# Patient Record
Sex: Female | Born: 1965 | Race: Black or African American | Hispanic: Refuse to answer | Marital: Married | Smoking: Never smoker
Health system: Southern US, Community
[De-identification: ages and names within clinical notes are randomized; demographics above are authoritative.]

## PROBLEM LIST (undated history)

## (undated) DIAGNOSIS — K219 Gastro-esophageal reflux disease without esophagitis: Secondary | ICD-10-CM

## (undated) DIAGNOSIS — E119 Type 2 diabetes mellitus without complications: Secondary | ICD-10-CM

## (undated) DIAGNOSIS — D649 Anemia, unspecified: Secondary | ICD-10-CM

## (undated) DIAGNOSIS — F419 Anxiety disorder, unspecified: Secondary | ICD-10-CM

## (undated) DIAGNOSIS — R001 Bradycardia, unspecified: Secondary | ICD-10-CM

## (undated) DIAGNOSIS — J302 Other seasonal allergic rhinitis: Secondary | ICD-10-CM

## (undated) DIAGNOSIS — E785 Hyperlipidemia, unspecified: Secondary | ICD-10-CM

## (undated) DIAGNOSIS — G4733 Obstructive sleep apnea (adult) (pediatric): Secondary | ICD-10-CM

## (undated) DIAGNOSIS — F32A Depression, unspecified: Secondary | ICD-10-CM

## (undated) DIAGNOSIS — Z8632 Personal history of gestational diabetes: Secondary | ICD-10-CM

## (undated) DIAGNOSIS — R51 Headache: Secondary | ICD-10-CM

## (undated) DIAGNOSIS — F329 Major depressive disorder, single episode, unspecified: Secondary | ICD-10-CM

## (undated) DIAGNOSIS — G43909 Migraine, unspecified, not intractable, without status migrainosus: Secondary | ICD-10-CM

## (undated) DIAGNOSIS — E669 Obesity, unspecified: Secondary | ICD-10-CM

## (undated) DIAGNOSIS — I10 Essential (primary) hypertension: Secondary | ICD-10-CM

## (undated) DIAGNOSIS — Z9889 Other specified postprocedural states: Secondary | ICD-10-CM

## (undated) DIAGNOSIS — G4730 Sleep apnea, unspecified: Secondary | ICD-10-CM

## (undated) DIAGNOSIS — M1990 Unspecified osteoarthritis, unspecified site: Secondary | ICD-10-CM

## (undated) DIAGNOSIS — R112 Nausea with vomiting, unspecified: Secondary | ICD-10-CM

## (undated) DIAGNOSIS — F99 Mental disorder, not otherwise specified: Secondary | ICD-10-CM

## (undated) DIAGNOSIS — J45909 Unspecified asthma, uncomplicated: Secondary | ICD-10-CM

## (undated) HISTORY — DX: Depression, unspecified: F32.A

## (undated) HISTORY — DX: Gastro-esophageal reflux disease without esophagitis: K21.9

## (undated) HISTORY — DX: Bradycardia, unspecified: R00.1

## (undated) HISTORY — DX: Personal history of gestational diabetes: Z86.32

## (undated) HISTORY — PX: ABDOMINAL HYSTERECTOMY: SHX81

## (undated) HISTORY — DX: Obstructive sleep apnea (adult) (pediatric): G47.33

## (undated) HISTORY — PX: WISDOM TOOTH EXTRACTION: SHX21

## (undated) HISTORY — DX: Major depressive disorder, single episode, unspecified: F32.9

## (undated) HISTORY — DX: Migraine, unspecified, not intractable, without status migrainosus: G43.909

## (undated) HISTORY — PX: OTHER SURGICAL HISTORY: SHX169

## (undated) HISTORY — DX: Obesity, unspecified: E66.9

---

## 1997-08-24 ENCOUNTER — Encounter: Admission: RE | Admit: 1997-08-24 | Discharge: 1997-08-24 | Payer: Self-pay | Admitting: Family Medicine

## 1997-08-28 ENCOUNTER — Encounter (INDEPENDENT_AMBULATORY_CARE_PROVIDER_SITE_OTHER): Payer: Self-pay | Admitting: *Deleted

## 1997-09-17 ENCOUNTER — Encounter: Admission: RE | Admit: 1997-09-17 | Discharge: 1997-09-17 | Payer: Self-pay | Admitting: Family Medicine

## 1997-09-27 ENCOUNTER — Emergency Department (HOSPITAL_COMMUNITY): Admission: EM | Admit: 1997-09-27 | Discharge: 1997-09-27 | Payer: Self-pay | Admitting: Emergency Medicine

## 1997-10-12 ENCOUNTER — Encounter: Admission: RE | Admit: 1997-10-12 | Discharge: 1997-10-12 | Payer: Self-pay | Admitting: Family Medicine

## 1997-12-24 ENCOUNTER — Encounter: Admission: RE | Admit: 1997-12-24 | Discharge: 1997-12-24 | Payer: Self-pay | Admitting: Family Medicine

## 1998-01-07 ENCOUNTER — Encounter: Admission: RE | Admit: 1998-01-07 | Discharge: 1998-01-07 | Payer: Self-pay | Admitting: Family Medicine

## 1998-02-07 ENCOUNTER — Encounter: Admission: RE | Admit: 1998-02-07 | Discharge: 1998-02-07 | Payer: Self-pay | Admitting: Family Medicine

## 1998-02-12 ENCOUNTER — Encounter: Admission: RE | Admit: 1998-02-12 | Discharge: 1998-02-12 | Payer: Self-pay | Admitting: Sports Medicine

## 1998-02-25 ENCOUNTER — Encounter: Admission: RE | Admit: 1998-02-25 | Discharge: 1998-02-25 | Payer: Self-pay | Admitting: Family Medicine

## 1998-02-26 ENCOUNTER — Encounter: Admission: RE | Admit: 1998-02-26 | Discharge: 1998-02-26 | Payer: Self-pay | Admitting: Family Medicine

## 1998-05-27 ENCOUNTER — Encounter: Admission: RE | Admit: 1998-05-27 | Discharge: 1998-05-27 | Payer: Self-pay | Admitting: Family Medicine

## 1998-05-31 ENCOUNTER — Encounter: Admission: RE | Admit: 1998-05-31 | Discharge: 1998-05-31 | Payer: Self-pay | Admitting: Family Medicine

## 1998-06-03 ENCOUNTER — Encounter: Admission: RE | Admit: 1998-06-03 | Discharge: 1998-06-03 | Payer: Self-pay | Admitting: Family Medicine

## 1998-10-10 ENCOUNTER — Encounter: Admission: RE | Admit: 1998-10-10 | Discharge: 1998-10-10 | Payer: Self-pay | Admitting: Sports Medicine

## 1998-12-23 ENCOUNTER — Encounter: Admission: RE | Admit: 1998-12-23 | Discharge: 1998-12-23 | Payer: Self-pay | Admitting: Family Medicine

## 1999-01-23 ENCOUNTER — Encounter: Payer: Self-pay | Admitting: Emergency Medicine

## 1999-01-23 ENCOUNTER — Emergency Department (HOSPITAL_COMMUNITY): Admission: EM | Admit: 1999-01-23 | Discharge: 1999-01-23 | Payer: Self-pay | Admitting: Emergency Medicine

## 1999-02-04 ENCOUNTER — Encounter: Admission: RE | Admit: 1999-02-04 | Discharge: 1999-02-04 | Payer: Self-pay | Admitting: Sports Medicine

## 1999-05-02 ENCOUNTER — Encounter: Admission: RE | Admit: 1999-05-02 | Discharge: 1999-05-02 | Payer: Self-pay | Admitting: Family Medicine

## 1999-05-23 ENCOUNTER — Encounter: Admission: RE | Admit: 1999-05-23 | Discharge: 1999-05-23 | Payer: Self-pay | Admitting: Family Medicine

## 1999-10-08 ENCOUNTER — Encounter: Admission: RE | Admit: 1999-10-08 | Discharge: 1999-10-08 | Payer: Self-pay | Admitting: Family Medicine

## 2000-04-02 ENCOUNTER — Emergency Department (HOSPITAL_COMMUNITY): Admission: EM | Admit: 2000-04-02 | Discharge: 2000-04-03 | Payer: Self-pay | Admitting: Emergency Medicine

## 2000-06-09 ENCOUNTER — Encounter: Admission: RE | Admit: 2000-06-09 | Discharge: 2000-06-09 | Payer: Self-pay | Admitting: Sports Medicine

## 2000-06-09 ENCOUNTER — Encounter: Admission: RE | Admit: 2000-06-09 | Discharge: 2000-06-09 | Payer: Self-pay | Admitting: Family Medicine

## 2000-06-09 ENCOUNTER — Encounter: Payer: Self-pay | Admitting: Sports Medicine

## 2000-09-17 ENCOUNTER — Encounter: Admission: RE | Admit: 2000-09-17 | Discharge: 2000-09-17 | Payer: Self-pay | Admitting: Family Medicine

## 2000-12-01 ENCOUNTER — Encounter: Admission: RE | Admit: 2000-12-01 | Discharge: 2000-12-01 | Payer: Self-pay | Admitting: Family Medicine

## 2000-12-09 ENCOUNTER — Encounter: Admission: RE | Admit: 2000-12-09 | Discharge: 2000-12-09 | Payer: Self-pay | Admitting: Family Medicine

## 2000-12-23 ENCOUNTER — Encounter: Admission: RE | Admit: 2000-12-23 | Discharge: 2001-01-20 | Payer: Self-pay | Admitting: Infectious Diseases

## 2001-02-17 ENCOUNTER — Encounter: Admission: RE | Admit: 2001-02-17 | Discharge: 2001-02-17 | Payer: Self-pay | Admitting: Sports Medicine

## 2001-03-24 ENCOUNTER — Encounter: Admission: RE | Admit: 2001-03-24 | Discharge: 2001-03-24 | Payer: Self-pay | Admitting: Family Medicine

## 2001-06-29 ENCOUNTER — Encounter: Admission: RE | Admit: 2001-06-29 | Discharge: 2001-06-29 | Payer: Self-pay | Admitting: Family Medicine

## 2001-08-03 ENCOUNTER — Encounter: Admission: RE | Admit: 2001-08-03 | Discharge: 2001-08-03 | Payer: Self-pay | Admitting: Family Medicine

## 2001-08-03 ENCOUNTER — Other Ambulatory Visit: Admission: RE | Admit: 2001-08-03 | Discharge: 2001-08-03 | Payer: Self-pay | Admitting: *Deleted

## 2002-02-02 ENCOUNTER — Encounter: Payer: Self-pay | Admitting: Emergency Medicine

## 2002-02-02 ENCOUNTER — Emergency Department (HOSPITAL_COMMUNITY): Admission: EM | Admit: 2002-02-02 | Discharge: 2002-02-02 | Payer: Self-pay | Admitting: Emergency Medicine

## 2002-02-06 ENCOUNTER — Encounter: Admission: RE | Admit: 2002-02-06 | Discharge: 2002-02-06 | Payer: Self-pay | Admitting: Sports Medicine

## 2002-02-06 ENCOUNTER — Encounter: Payer: Self-pay | Admitting: Sports Medicine

## 2002-02-06 ENCOUNTER — Encounter: Admission: RE | Admit: 2002-02-06 | Discharge: 2002-02-06 | Payer: Self-pay | Admitting: Family Medicine

## 2002-02-13 ENCOUNTER — Encounter: Admission: RE | Admit: 2002-02-13 | Discharge: 2002-02-13 | Payer: Self-pay | Admitting: Family Medicine

## 2002-02-20 ENCOUNTER — Encounter: Payer: Self-pay | Admitting: Sports Medicine

## 2002-02-20 ENCOUNTER — Ambulatory Visit (HOSPITAL_COMMUNITY): Admission: RE | Admit: 2002-02-20 | Discharge: 2002-02-20 | Payer: Self-pay | Admitting: Sports Medicine

## 2002-03-03 ENCOUNTER — Encounter: Admission: RE | Admit: 2002-03-03 | Discharge: 2002-03-03 | Payer: Self-pay | Admitting: Family Medicine

## 2002-05-09 ENCOUNTER — Encounter: Admission: RE | Admit: 2002-05-09 | Discharge: 2002-05-09 | Payer: Self-pay | Admitting: Family Medicine

## 2002-05-23 ENCOUNTER — Encounter: Admission: RE | Admit: 2002-05-23 | Discharge: 2002-05-23 | Payer: Self-pay | Admitting: Family Medicine

## 2002-07-04 ENCOUNTER — Other Ambulatory Visit: Admission: RE | Admit: 2002-07-04 | Discharge: 2002-07-04 | Payer: Self-pay | Admitting: Gynecology

## 2002-09-09 ENCOUNTER — Emergency Department (HOSPITAL_COMMUNITY): Admission: EM | Admit: 2002-09-09 | Discharge: 2002-09-09 | Payer: Self-pay | Admitting: Emergency Medicine

## 2002-09-09 ENCOUNTER — Encounter: Payer: Self-pay | Admitting: Emergency Medicine

## 2002-09-13 ENCOUNTER — Ambulatory Visit (HOSPITAL_COMMUNITY): Admission: RE | Admit: 2002-09-13 | Discharge: 2002-09-13 | Payer: Self-pay | Admitting: Family Medicine

## 2002-09-13 ENCOUNTER — Encounter: Admission: RE | Admit: 2002-09-13 | Discharge: 2002-09-13 | Payer: Self-pay | Admitting: Family Medicine

## 2002-11-09 ENCOUNTER — Emergency Department (HOSPITAL_COMMUNITY): Admission: EM | Admit: 2002-11-09 | Discharge: 2002-11-09 | Payer: Self-pay | Admitting: Emergency Medicine

## 2004-04-24 ENCOUNTER — Encounter: Admission: RE | Admit: 2004-04-24 | Discharge: 2004-04-24 | Payer: Self-pay | Admitting: Orthopedic Surgery

## 2005-06-01 ENCOUNTER — Encounter: Admission: RE | Admit: 2005-06-01 | Discharge: 2005-06-01 | Payer: Self-pay | Admitting: Emergency Medicine

## 2006-05-27 DIAGNOSIS — E669 Obesity, unspecified: Secondary | ICD-10-CM

## 2006-05-27 DIAGNOSIS — J45909 Unspecified asthma, uncomplicated: Secondary | ICD-10-CM | POA: Insufficient documentation

## 2006-05-28 ENCOUNTER — Encounter (INDEPENDENT_AMBULATORY_CARE_PROVIDER_SITE_OTHER): Payer: Self-pay | Admitting: *Deleted

## 2006-10-15 ENCOUNTER — Other Ambulatory Visit: Admission: RE | Admit: 2006-10-15 | Discharge: 2006-10-15 | Payer: Self-pay | Admitting: Emergency Medicine

## 2007-06-15 ENCOUNTER — Encounter: Admission: RE | Admit: 2007-06-15 | Discharge: 2007-09-13 | Payer: Self-pay | Admitting: Emergency Medicine

## 2008-01-11 ENCOUNTER — Encounter: Admission: RE | Admit: 2008-01-11 | Discharge: 2008-01-11 | Payer: Self-pay | Admitting: Obstetrics and Gynecology

## 2008-01-20 ENCOUNTER — Encounter: Admission: RE | Admit: 2008-01-20 | Discharge: 2008-01-20 | Payer: Self-pay | Admitting: Obstetrics and Gynecology

## 2008-01-31 ENCOUNTER — Other Ambulatory Visit: Admission: RE | Admit: 2008-01-31 | Discharge: 2008-01-31 | Payer: Self-pay | Admitting: Obstetrics and Gynecology

## 2008-02-15 ENCOUNTER — Inpatient Hospital Stay (HOSPITAL_COMMUNITY): Admission: RE | Admit: 2008-02-15 | Discharge: 2008-02-17 | Payer: Self-pay | Admitting: Obstetrics and Gynecology

## 2008-02-15 ENCOUNTER — Encounter (INDEPENDENT_AMBULATORY_CARE_PROVIDER_SITE_OTHER): Payer: Self-pay | Admitting: Obstetrics and Gynecology

## 2009-01-03 ENCOUNTER — Emergency Department (HOSPITAL_COMMUNITY): Admission: EM | Admit: 2009-01-03 | Discharge: 2009-01-03 | Payer: Self-pay | Admitting: Emergency Medicine

## 2009-01-19 ENCOUNTER — Encounter: Admission: RE | Admit: 2009-01-19 | Discharge: 2009-01-19 | Payer: Self-pay | Admitting: Specialist

## 2009-02-01 ENCOUNTER — Encounter: Admission: RE | Admit: 2009-02-01 | Discharge: 2009-02-01 | Payer: Self-pay | Admitting: Obstetrics and Gynecology

## 2009-02-13 ENCOUNTER — Encounter: Admission: RE | Admit: 2009-02-13 | Discharge: 2009-03-27 | Payer: Self-pay | Admitting: Specialist

## 2009-07-20 ENCOUNTER — Emergency Department (HOSPITAL_COMMUNITY): Admission: EM | Admit: 2009-07-20 | Discharge: 2009-07-21 | Payer: Self-pay | Admitting: Emergency Medicine

## 2009-11-02 ENCOUNTER — Encounter: Admission: RE | Admit: 2009-11-02 | Discharge: 2009-11-02 | Payer: Self-pay | Admitting: Orthopedic Surgery

## 2010-02-24 ENCOUNTER — Encounter: Admission: RE | Admit: 2010-02-24 | Discharge: 2010-02-24 | Payer: Self-pay | Admitting: Obstetrics and Gynecology

## 2010-04-20 ENCOUNTER — Encounter: Payer: Self-pay | Admitting: Obstetrics and Gynecology

## 2010-07-03 LAB — COMPREHENSIVE METABOLIC PANEL
ALT: 32 U/L (ref 0–35)
AST: 33 U/L (ref 0–37)
Albumin: 3.5 g/dL (ref 3.5–5.2)
Alkaline Phosphatase: 92 U/L (ref 39–117)
Creatinine, Ser: 0.71 mg/dL (ref 0.4–1.2)
Total Bilirubin: 0.6 mg/dL (ref 0.3–1.2)

## 2010-07-03 LAB — CBC
HCT: 39.8 % (ref 36.0–46.0)
MCHC: 33.4 g/dL (ref 30.0–36.0)
MCV: 92.6 fL (ref 78.0–100.0)
WBC: 5.5 10*3/uL (ref 4.0–10.5)

## 2010-07-03 LAB — URINALYSIS, ROUTINE W REFLEX MICROSCOPIC
Bilirubin Urine: NEGATIVE
Glucose, UA: NEGATIVE mg/dL
Hgb urine dipstick: NEGATIVE
Ketones, ur: NEGATIVE mg/dL
Specific Gravity, Urine: 1.022 (ref 1.005–1.030)
Urobilinogen, UA: 1 mg/dL (ref 0.0–1.0)
pH: 6.5 (ref 5.0–8.0)

## 2010-07-03 LAB — DIFFERENTIAL
Eosinophils Absolute: 0.1 10*3/uL (ref 0.0–0.7)
Eosinophils Relative: 2 % (ref 0–5)
Lymphocytes Relative: 43 % (ref 12–46)
Lymphs Abs: 2.4 10*3/uL (ref 0.7–4.0)
Monocytes Absolute: 0.4 10*3/uL (ref 0.1–1.0)
Monocytes Relative: 8 % (ref 3–12)
Neutro Abs: 2.5 10*3/uL (ref 1.7–7.7)
Neutrophils Relative %: 45 % (ref 43–77)

## 2010-07-03 LAB — GLUCOSE, CAPILLARY

## 2010-07-03 LAB — POCT I-STAT, CHEM 8
Calcium, Ion: 1.17 mmol/L (ref 1.12–1.32)
Chloride: 102 mEq/L (ref 96–112)
Potassium: 3.8 mEq/L (ref 3.5–5.1)

## 2010-07-25 ENCOUNTER — Ambulatory Visit (HOSPITAL_BASED_OUTPATIENT_CLINIC_OR_DEPARTMENT_OTHER)
Admission: RE | Admit: 2010-07-25 | Discharge: 2010-07-25 | Disposition: A | Discharge status: Home / Self Care | Payer: 59 | Source: Ambulatory Visit | Attending: Orthopedic Surgery | Admitting: Orthopedic Surgery

## 2010-07-25 ENCOUNTER — Other Ambulatory Visit: Payer: Self-pay | Admitting: Orthopedic Surgery

## 2010-07-25 DIAGNOSIS — M6740 Ganglion, unspecified site: Secondary | ICD-10-CM | POA: Insufficient documentation

## 2010-07-25 DIAGNOSIS — G4733 Obstructive sleep apnea (adult) (pediatric): Secondary | ICD-10-CM | POA: Insufficient documentation

## 2010-07-25 DIAGNOSIS — E669 Obesity, unspecified: Secondary | ICD-10-CM | POA: Insufficient documentation

## 2010-07-25 DIAGNOSIS — J45909 Unspecified asthma, uncomplicated: Secondary | ICD-10-CM | POA: Insufficient documentation

## 2010-07-25 DIAGNOSIS — M19079 Primary osteoarthritis, unspecified ankle and foot: Secondary | ICD-10-CM | POA: Insufficient documentation

## 2010-07-28 NOTE — Op Note (Signed)
[name] [name], [name] [name]          ACCOUNT NO.:  [id number]  MEDICAL RECORD NO.:  [id number]          PATIENT TYPE:  LOCATION:                                 FACILITY:  PHYSICIAN:  Toni Arthurs, MD             DATE OF BIRTH:  DATE OF PROCEDURE:  07/25/2010 DATE OF DISCHARGE:                              OPERATIVE REPORT   PREOPERATIVE DIAGNOSES: 1. Left foot ganglion cyst. 2. Left midfoot arthritis.  POSTOPERATIVE DIAGNOSES: 1. Left foot ganglion cyst. 2. Left midfoot arthritis.  PROCEDURE: 1. Left foot ganglion cyst excision. 2. Left midfoot diagnostic and therapeutic injection. 3. Intraoperative interpretation of fluoroscopic images.  SURGEON:  Toni Arthurs, MD  ANESTHESIA:  General.  IV FLUIDS:  See anesthesia record.  ESTIMATED BLOOD LOSS:  Minimal.  TOURNIQUET TIME:  See anesthesia record.  COMPLICATIONS:  None apparent.  DISPOSITION:  Extubated, awake, and stable to recovery.  INDICATIONS FOR PROCEDURE:  The patient is a 45 year old female with increasing left foot pain over the last couple of years.  X-rays show midfoot arthritis, particularly at the tarsometatarsal joints.  She also has noted a mass on the dorsum of the foot that is increasing in size and painful with shoe wear.  She would like to have this excised.  She understands the risks and benefits of this and alternative treatment options including observation and aspiration.  She understands specifically risks of bleeding, infection, nerve damage, blood clots, need for additional surgery, amputation, and death.  She would like to proceed.  After preoperative consent was obtained, the correct operative site was identified, the patient was brought to the operating room, and placed supine on the operating table.  General anesthesia was induced. Preoperative antibiotics were administered.  Surgical time-out was taken.  The left lower extremity was prepped and draped in standard sterile fashion.   Longitudinal incision was marked on the skin overlying the mass.  Extremity was exsanguinated and a 4-inch Esmarch tourniquet was wrapped about the ankle.  The previously marked incision was made and sharp dissection was carried down through the skin.  Blunt dissection was then carried down through the subcutaneous tissue.  The extensor digitorum brevis muscle belly was retracted laterally.  The ganglion cyst was identified in this location.  Blunt dissection was carried down around the cyst and it was excised in its entirety.  The stalk was noted to go into the second tarsometatarsal joint.  This again was excised in its entirety.  The dorsalis pedis artery was identified and the mass was noted to be tightly adherent to this.  It was separated gently from the artery.  The mass was passed off the field as a specimen to pathology.  The tourniquet was released.  The dorsalis pedis artery was noted to have appropriate flow.  Hemostasis was achieved.  The wound was irrigated copiously.  Inverted simple sutures of 4-0 Monocryl were used to close the subcutaneous tissue.  Horizontal mattress sutures of 4- 0 nylon were used to close the skin incision.  At that point, the fluoroscopic images were obtained in the AP and oblique planes. Approximately 3 mL of  plain 1% lidocaine and 1 mL of Kenalog (40 mg) were injected into the second and third tarsometatarsal joints under fluoroscopic visualization.  Sterile dressings were then applied followed by a compression wrap.  The patient was awakened by anesthesia and transported to the recovery room in stable condition.  FOLLOWUP PLAN:  The patient will be weightbearing as tolerated on her left lower extremity.  She will follow up with me in 2 weeks for suture removal.  She will remove her dressing on Tuesday and begin to shower at that time.     Toni Arthurs, MD     JH/MEDQ  D:  07/25/2010  T:  07/25/2010  Job:  [id number]  Electronically Signed by  Jonny Ruiz Akelia Husted  on 07/28/2010 08:03:27 AM

## 2010-08-12 NOTE — H&P (Signed)
[name], [name]          ACCOUNT NO.:  [id number]   MEDICAL RECORD NO.:  [id number]          PATIENT TYPE:  AMB   LOCATION:  SDC                           FACILITY:  [facility]   PHYSICIAN:  Gerald Leitz, MD          DATE OF BIRTH:  December 31, 1965   DATE OF ADMISSION:  DATE OF DISCHARGE:                              HISTORY & PHYSICAL   The patient is scheduled for surgery on February 15, 2008.   HISTORY OF PRESENT ILLNESS:  This is a 45 year old G3, P3 with  menorrhagia, uterine fibroids, and anemia.  She had an ultrasound  performed on January 31, 2008, that showed the uterus to measure 8.7 x  5.4 x 5.6 cm with a submucosal fibroid of 1.1 cm; two other small  fibroids, the largest of which is 1.9 cm.  The endometrium was thickened  with a submucosal fibroid with the majority within the endometrial  cavity.  Endometrial biopsy performed on June 06, 2007, showed benign  weakly proliferative endometrium.  No hyperplasia, dysplasica, or  malignancy was identified.  Hemoglobin on January 10, 2008, was 9.4,  hematocrit of 28.1, and MCV of 78.  She has failed therapy with OCPs and  Depo-Provera.  Desires definitive therapy via hysterectomy.   PAST OB HISTORY:  Vaginal surgery x2 and cesarean section x1.   PAST GYN HISTORY:  Last Pap smear on January 31, 2008, which was normal.  No history of abnormal Pap smears.  No history of sexually transmitted  diseases.   PAST MEDICAL HISTORY:  Asthma.  She uses her inhaler approximately 2  times a month.   PAST SURGICAL HISTORY:  Cesarean section x1 and severe carpal tunnel  bilaterally.   MEDICATIONS:  Ventolin and iron supplements.   ALLERGIES:  No known drug allergies.  She does report SEAFOOD allergy,  questionable IODINE allergies.   SOCIAL HISTORY:  The patient is single.  She denies tobacco, alcohol, or  illicit drug use.   FAMILY HISTORY:  Negative for breast or ovarian cancer.   REVIEW OF SYSTEMS:  Negative except as stated in the  history of present  illness.   PHYSICAL EXAMINATION:  VITAL SIGNS:  Blood pressure 140/80, weight 275-  1/2 pounds, height 54-1/2 inches.  GENERAL:  Alert and oriented in no acute distress.  CARDIOVASCULAR:  Regular rate and rhythm.  LUNGS:  Clear to auscultation bilaterally.  ABDOMEN:  Soft, nontender, and nondistended.  No masses.  Positive bowel  sounds.  EXTREMITIES:  No clubbing, cyanosis, or edema.  PELVIC:  Normal external female genitalia.  No vulvar, vaginal, or  cervical lesions are noted.  Bimanual exam reveals uterine tenderness to  palpation as well as left adnexal tenderness.  No cervical motion  tenderness.   ASSESSMENT AND PLAN:  A 45 year old with symptomatic uterine fibroids;  submucosal fibroids; pelvic pain; and desires definitive therapy, via  total abdominal hysterectomy.  Risks, benefits, and alternatives of the  surgery were discussed with the patient including, but not limited to  infection, bleeding, damage to bowel or bladder and surrounding organs  with the need for further surgery, risk  of transfusion, HIV, hepatitis B  and C were discussed.  The patient voiced understanding of all risks and  desires to proceed with abdominal hysterectomy.      Gerald Leitz, MD  Electronically Signed     TC/MEDQ  D:  02/11/2008  T:  02/11/2008  Job:  [id number]

## 2010-08-12 NOTE — Op Note (Signed)
[name] [name], [name] [name]          ACCOUNT NO.:  [id number]   MEDICAL RECORD NO.:  [id number]          PATIENT TYPE:  INP   LOCATION:  9320                          FACILITY:  [facility]   PHYSICIAN:  Gerald Leitz, MD          DATE OF BIRTH:  [birth year]   DATE OF PROCEDURE:  02/15/2008  DATE OF DISCHARGE:                               OPERATIVE REPORT   PREOPERATIVE DIAGNOSES:  1. Menorrhagia.  2. Uterine fibroids.   POSTOPERATIVE DIAGNOSES:  1. Menorrhagia.  2. Uterine fibroids.  3. Complex right ovarian cyst.   PROCEDURE:  Total abdominal hysterectomy and right salpingo-  oophorectomy.   SURGEON:  Gerald Leitz, MD.   ASSISTANT:  Charles A. Delcambre, MD.   ANESTHESIA:  General.   FINDINGS:  Complex right ovarian cyst and small uterine fibroids.   SPECIMENS:  Uterus, cervix, and right fallopian tube and ovary.  Disposition of specimen:  Pathology.   ESTIMATED BLOOD LOSS:  600 mL.   FLUIDS:  3200 mL.   URINE OUTPUT:  400 mL.   COMPLICATIONS:  None.   PROCEDURE:  Informed consent was obtained.  The patient was taken to the  operating room where she was placed under general anesthesia.  A  vertical abdominal incision was made with a scalpel, carried down to the  underlying layer of fascia.  The fascia was incised in the midline and  the incision was extended superiorly and inferiorly with Mayo scissors.  The peritoneum was identified and entered bluntly.  The incision was  extended superiorly and inferiorly with good visualization of the  bladder.  The Balfour retractor was placed and the bowel was packed away  with moist laparotomy sponges.  The cornu of the uterus were grasped  with Kelly clamps.  The patient was noted to have a complex cyst on the  right ovary.  Attention was turned to the right round ligament, which  was suture ligated with 0 Vicryl, transected, and the vesicouterine  peritoneum was dissected in the midline with Metzenbaum scissors.  This  was repeated on  the left round ligament.  The right ureter was  identified.  The right infundibulopelvic ligament was then clamped with  a Zeppelin clamp, transected, and suture ligated with a free tie of 0  Vicryl followed by suture ligature of 0 Vicryl.  Excellent hemostasis  was noted.  Attention was turned to the left utero-ovarian ligament,  which was clamped with a Zeppelin clamp, transected, and suture ligated  with a free tie of 0 Vicryl followed by suture ligature of 0 Vicryl.  The bladder was dissected off the cervix with Metzenbaum scissors and a  sponge stick.  The uterine arteries were skeletonized bilaterally.  The  right uterine artery was clamped with a Zeppelin clamp, transected, and  suture ligated with 0 Vicryl.  This was repeated on the left uterine  artery.  Excellent hemostasis was noted.  The cardinal ligaments were  clamped with Zeppelin clamps, transected, and suture ligated with 0  Vicryl.  The patient was noted to have some bleeding from the left  cardinal ligament.  This was  controlled with suture ligature of 2-0  Vicryl.  Uterosacral ligaments were clamped with Heaney clamps  bilaterally, transected, and suture ligated with 0 Vicryl.  The cervix  was then amputated from the vaginal cuff with vaginal mucosa with  Satinsky scissors.  The angle suture of 0 Vicryl was placed along the  left vaginal cuff angle and anchored to the ipsilateral uterosacral  ligament.  This was repeated on the right vaginal cuff.  A right angle  stitch was placed with 0 Vicryl and transfixed to the right uterosacral  ligament.  The vaginal cuff was closed with a running locked stitch of 0  Vicryl.  The patient was noted to have some bleeding from the posterior  aspect of the vaginal cuff.  This was controlled with free tie of 2-0  Vicryl.  She was also noted to have some bleeding from the bladder flap,  which was controlled with combination of electrocautery as well as 2-0  Vicryl.  Excellent  hemostasis was then noted.  Avitene was placed along  the vaginal cuff to ensure continued hemostasis.  All instruments were  then removed from the abdomen.  The fascia was reapproximated with 0  double-stranded PDS.  The skin was closed with staples.  Sponge, lap,  and needle counts were correct x2.  The patient was taken to the  recovery room awake and in stable condition.      Gerald Leitz, MD  Electronically Signed     TC/MEDQ  D:  02/15/2008  T:  02/16/2008  Job:  [id number]

## 2010-08-15 NOTE — Discharge Summary (Signed)
NAMECERENITI, CURB          ACCOUNT NO.:  [id number]   MEDICAL RECORD NO.:  [id number]          PATIENT TYPE:  INP   LOCATION:  9320                          [facility]ACILITY:  [facility]   PHYSICIAN:  Gerald Leitz, MD          DATE OF BIRTH:  25-Mar-1966   DATE OF ADMISSION:  02/15/2008  DATE OF DISCHARGE:  02/17/2008                               DISCHARGE SUMMARY   ADMISSION DIAGNOSES:  1. Menorrhagia.  2. Uterine fibroids.   DISCHARGE DIAGNOSES:  1. Menorrhagia.  2. Uterine fibroids.  3. Status post total abdominal hysterectomy and right salpingo-      oophorectomy as well as complex right ovarian cyst.   BRIEF HOSPITAL COURSE:  The patient underwent total abdominal  hysterectomy and right salpingo-oophorectomy and February 15, 2008.  She  did well postoperatively.  Hemoglobin on postop day #1 was 8.9 and  hematocrit of 27.5.  She was discharged to home on postop day #2 with  return of bowel function, tolerating p.o.  She was discharged on the  following medications Percocet, Motrin, and Hemocyte.  She returned to  our office for staple removal on February 21, 2008.   CONDITION ON DISCHARGE:  Stable.      Gerald Leitz, MD  Electronically Signed     TC/MEDQ  D:  04/04/2008  T:  04/05/2008  Job:  [id number]

## 2010-12-25 ENCOUNTER — Other Ambulatory Visit: Payer: Self-pay | Admitting: Family Medicine

## 2010-12-25 DIAGNOSIS — Z1231 Encounter for screening mammogram for malignant neoplasm of breast: Secondary | ICD-10-CM

## 2010-12-30 LAB — URINALYSIS, ROUTINE W REFLEX MICROSCOPIC
Ketones, ur: NEGATIVE
Nitrite: NEGATIVE
Protein, ur: NEGATIVE
pH: 6

## 2010-12-30 LAB — BASIC METABOLIC PANEL
CO2: 25
Calcium: 9.1
Creatinine, Ser: 0.61
GFR calc non Af Amer: >60
Glucose, Bld: 98
Potassium: 4.3

## 2010-12-30 LAB — TYPE AND SCREEN: Antibody Screen: POSITIVE

## 2010-12-30 LAB — CBC
HCT: 27.5 — ABNORMAL LOW
HCT: 32.8 — ABNORMAL LOW
Hemoglobin: 10.7 — ABNORMAL LOW
MCV: 80.9
MCV: 82.5
Platelets: 249
Platelets: 284
WBC: 4.9
WBC: 9.3

## 2010-12-30 LAB — PREGNANCY, URINE: Preg Test, Ur: NEGATIVE

## 2011-02-23 ENCOUNTER — Other Ambulatory Visit: Payer: Self-pay | Admitting: Gastroenterology

## 2011-02-27 ENCOUNTER — Ambulatory Visit: Payer: 59

## 2011-03-10 ENCOUNTER — Ambulatory Visit
Admission: RE | Admit: 2011-03-10 | Discharge: 2011-03-10 | Disposition: A | Discharge status: Home / Self Care | Payer: 59 | Source: Ambulatory Visit | Attending: Family Medicine | Admitting: Family Medicine

## 2011-03-10 DIAGNOSIS — Z1231 Encounter for screening mammogram for malignant neoplasm of breast: Secondary | ICD-10-CM

## 2011-03-26 ENCOUNTER — Ambulatory Visit: Payer: 59

## 2011-07-28 ENCOUNTER — Other Ambulatory Visit: Payer: Self-pay | Admitting: Physical Medicine and Rehabilitation

## 2011-07-28 DIAGNOSIS — M79605 Pain in left leg: Secondary | ICD-10-CM

## 2011-07-28 DIAGNOSIS — M545 Low back pain: Secondary | ICD-10-CM

## 2011-08-04 ENCOUNTER — Other Ambulatory Visit: Payer: 59

## 2011-11-24 ENCOUNTER — Ambulatory Visit
Admission: RE | Admit: 2011-11-24 | Discharge: 2011-11-24 | Disposition: A | Discharge status: Home / Self Care | Payer: 59 | Source: Ambulatory Visit | Attending: Physical Medicine and Rehabilitation | Admitting: Physical Medicine and Rehabilitation

## 2011-11-24 DIAGNOSIS — M545 Low back pain: Secondary | ICD-10-CM

## 2011-11-24 DIAGNOSIS — M79605 Pain in left leg: Secondary | ICD-10-CM

## 2011-11-27 ENCOUNTER — Other Ambulatory Visit: Payer: 59

## 2012-01-08 ENCOUNTER — Other Ambulatory Visit: Payer: Self-pay | Admitting: Family Medicine

## 2012-01-08 DIAGNOSIS — Z1231 Encounter for screening mammogram for malignant neoplasm of breast: Secondary | ICD-10-CM

## 2012-03-10 ENCOUNTER — Ambulatory Visit: Payer: 59

## 2012-07-28 ENCOUNTER — Other Ambulatory Visit: Payer: Self-pay | Admitting: Obstetrics and Gynecology

## 2012-08-05 ENCOUNTER — Encounter (HOSPITAL_COMMUNITY): Payer: Self-pay

## 2012-08-05 ENCOUNTER — Other Ambulatory Visit: Payer: Self-pay

## 2012-08-05 ENCOUNTER — Encounter (HOSPITAL_COMMUNITY)
Admission: RE | Admit: 2012-08-05 | Discharge: 2012-08-05 | Disposition: A | Discharge status: Home / Self Care | Payer: 59 | Source: Ambulatory Visit | Attending: Obstetrics and Gynecology | Admitting: Obstetrics and Gynecology

## 2012-08-05 ENCOUNTER — Encounter (HOSPITAL_COMMUNITY): Payer: Self-pay | Admitting: Pharmacy Technician

## 2012-08-05 HISTORY — DX: Anemia, unspecified: D64.9

## 2012-08-05 HISTORY — DX: Headache: R51

## 2012-08-05 HISTORY — DX: Sleep apnea, unspecified: G47.30

## 2012-08-05 HISTORY — DX: Gastro-esophageal reflux disease without esophagitis: K21.9

## 2012-08-05 HISTORY — DX: Hyperlipidemia, unspecified: E78.5

## 2012-08-05 HISTORY — DX: Anxiety disorder, unspecified: F41.9

## 2012-08-05 HISTORY — DX: Essential (primary) hypertension: I10

## 2012-08-05 HISTORY — DX: Nausea with vomiting, unspecified: R11.2

## 2012-08-05 HISTORY — DX: Mental disorder, not otherwise specified: F99

## 2012-08-05 HISTORY — DX: Unspecified osteoarthritis, unspecified site: M19.90

## 2012-08-05 HISTORY — DX: Other seasonal allergic rhinitis: J30.2

## 2012-08-05 HISTORY — DX: Unspecified asthma, uncomplicated: J45.909

## 2012-08-05 HISTORY — DX: Other specified postprocedural states: Z98.890

## 2012-08-05 LAB — BASIC METABOLIC PANEL WITH GFR
BUN: 10 mg/dL (ref 6–23)
CO2: 32 meq/L (ref 19–32)
Calcium: 9.1 mg/dL (ref 8.4–10.5)
Chloride: 103 meq/L (ref 96–112)
Creatinine, Ser: 0.83 mg/dL (ref 0.50–1.10)
GFR calc Af Amer: >90 mL/min
GFR calc non Af Amer: 83 mL/min — ABNORMAL LOW
Glucose, Bld: 174 mg/dL — ABNORMAL HIGH (ref 70–99)
Potassium: 4.2 meq/L (ref 3.5–5.1)
Sodium: 140 meq/L (ref 135–145)

## 2012-08-05 LAB — CBC
HCT: 37.3 % (ref 36.0–46.0)
Hemoglobin: 12.3 g/dL (ref 12.0–15.0)
MCH: 30.8 pg (ref 26.0–34.0)
MCHC: 33 g/dL (ref 30.0–36.0)
MCV: 93.3 fL (ref 78.0–100.0)
Platelets: 217 K/uL (ref 150–400)
RBC: 4 MIL/uL (ref 3.87–5.11)
RDW: 12.4 % (ref 11.5–15.5)
WBC: 5.4 K/uL (ref 4.0–10.5)

## 2012-08-05 NOTE — Patient Instructions (Addendum)
   Your procedure is scheduled on: Monday, May 12  Enter through the Main Entrance of [facility] at:  1130 am Pick up the phone at the desk and dial [phone] and inform us of your arrival.  Please call this number if you have any problems the morning of surgery: [phone]  Remember: Do not eat food after midnight: Sunday Do not drink clear liquids after: 9 am Monday Take these medicines the morning of surgery with a SIP OF WATER:   Blood pressure med, nexium.  May use flonase and xanax if needed on day of surgery.  Patient to bring albuterol inhaler and CPAP machine with her on day of surgery.  Do not wear jewelry, make-up, or FINGER nail polish No metal in your hair or on your body. Do not wear lotions, powders, perfumes. You may wear deodorant.  Please use your CHG wash as directed prior to surgery.  Do not shave anywhere for at least 12 hours prior to first CHG shower.  Do not bring valuables to the hospital. Contacts, dentures or bridgework may not be worn into surgery.  Leave suitcase in the car. After Surgery it may be brought to your room. For patients being admitted to the hospital, checkout time is 11:00am the day of discharge.  Patients discharged on the day of surgery will not be allowed to drive home.  Home with husband Apolonio Schneiders.

## 2012-08-08 ENCOUNTER — Ambulatory Visit (HOSPITAL_COMMUNITY)
Admission: RE | Admit: 2012-08-08 | Discharge: 2012-08-09 | Disposition: A | Discharge status: Home / Self Care | Payer: 59 | Source: Ambulatory Visit | Attending: Obstetrics and Gynecology | Admitting: Obstetrics and Gynecology

## 2012-08-08 ENCOUNTER — Encounter (HOSPITAL_COMMUNITY)
Admission: RE | Disposition: A | Discharge status: Home / Self Care | Payer: Self-pay | Source: Ambulatory Visit | Attending: Obstetrics and Gynecology

## 2012-08-08 ENCOUNTER — Ambulatory Visit (HOSPITAL_COMMUNITY): Payer: 59 | Admitting: Anesthesiology

## 2012-08-08 ENCOUNTER — Encounter (HOSPITAL_COMMUNITY): Payer: Self-pay | Admitting: Anesthesiology

## 2012-08-08 ENCOUNTER — Ambulatory Visit (HOSPITAL_COMMUNITY): Payer: 59

## 2012-08-08 ENCOUNTER — Encounter (HOSPITAL_COMMUNITY): Payer: Self-pay | Admitting: *Deleted

## 2012-08-08 DIAGNOSIS — R1032 Left lower quadrant pain: Secondary | ICD-10-CM | POA: Insufficient documentation

## 2012-08-08 DIAGNOSIS — Z90721 Acquired absence of ovaries, unilateral: Secondary | ICD-10-CM

## 2012-08-08 DIAGNOSIS — K660 Peritoneal adhesions (postprocedural) (postinfection): Secondary | ICD-10-CM

## 2012-08-08 DIAGNOSIS — N83209 Unspecified ovarian cyst, unspecified side: Secondary | ICD-10-CM | POA: Insufficient documentation

## 2012-08-08 DIAGNOSIS — N736 Female pelvic peritoneal adhesions (postinfective): Secondary | ICD-10-CM | POA: Insufficient documentation

## 2012-08-08 HISTORY — PX: ROBOTIC ASSISTED LAPAROSCOPIC LYSIS OF ADHESION: SHX6080

## 2012-08-08 HISTORY — PX: ROBOTIC ASSISTED SALPINGO OOPHERECTOMY: SHX6082

## 2012-08-08 HISTORY — PX: CYSTOSCOPY: SHX5120

## 2012-08-08 SURGERY — ROBOTIC ASSISTED SALPINGO OOPHORECTOMY
Anesthesia: General | Site: Urethra | Wound class: Clean Contaminated

## 2012-08-08 MED ORDER — FENTANYL CITRATE 0.05 MG/ML IJ SOLN
25.0000 ug | INTRAMUSCULAR | Status: DC | PRN
  Administered 2012-08-08 (×4): 50 ug via INTRAVENOUS

## 2012-08-08 MED ORDER — PHENYLEPHRINE 40 MCG/ML (10ML) SYRINGE FOR IV PUSH (FOR BLOOD PRESSURE SUPPORT)
PREFILLED_SYRINGE | INTRAVENOUS | Status: AC
  Filled 2012-08-08: qty 10

## 2012-08-08 MED ORDER — ALPRAZOLAM 0.5 MG PO TABS
1.0000 mg | ORAL_TABLET | Freq: Once | ORAL | Status: AC | PRN
  Administered 2012-08-08: 1 mg via ORAL
  Filled 2012-08-08: qty 2

## 2012-08-08 MED ORDER — DEXAMETHASONE SODIUM PHOSPHATE 4 MG/ML IJ SOLN
INTRAMUSCULAR | Status: DC | PRN
  Administered 2012-08-08: 8 mg via INTRAVENOUS

## 2012-08-08 MED ORDER — PROPOFOL 10 MG/ML IV EMUL
INTRAVENOUS | Status: AC
  Filled 2012-08-08: qty 20

## 2012-08-08 MED ORDER — FENTANYL CITRATE 0.05 MG/ML IJ SOLN
INTRAMUSCULAR | Status: AC
  Filled 2012-08-08: qty 2

## 2012-08-08 MED ORDER — PHENYLEPHRINE HCL 10 MG/ML IJ SOLN
INTRAMUSCULAR | Status: DC | PRN
  Administered 2012-08-08 (×2): 80 ug via INTRAVENOUS
  Administered 2012-08-08: 40 ug via INTRAVENOUS
  Administered 2012-08-08: 80 ug via INTRAVENOUS
  Administered 2012-08-08 (×4): 40 ug via INTRAVENOUS

## 2012-08-08 MED ORDER — BUPIVACAINE HCL (PF) 0.25 % IJ SOLN
INTRAMUSCULAR | Status: DC | PRN
  Administered 2012-08-08: 10 mL

## 2012-08-08 MED ORDER — ROCURONIUM BROMIDE 50 MG/5ML IV SOLN
INTRAVENOUS | Status: AC
  Filled 2012-08-08: qty 1

## 2012-08-08 MED ORDER — IBUPROFEN 800 MG PO TABS
800.0000 mg | ORAL_TABLET | Freq: Once | ORAL | Status: AC
  Administered 2012-08-08: 800 mg via ORAL

## 2012-08-08 MED ORDER — LIDOCAINE HCL (CARDIAC) 20 MG/ML IV SOLN
INTRAVENOUS | Status: DC | PRN
  Administered 2012-08-08: 80 mg via INTRAVENOUS

## 2012-08-08 MED ORDER — MIDAZOLAM HCL 5 MG/5ML IJ SOLN
INTRAMUSCULAR | Status: DC | PRN
  Administered 2012-08-08: 1 mg via INTRAVENOUS

## 2012-08-08 MED ORDER — DEXAMETHASONE SODIUM PHOSPHATE 10 MG/ML IJ SOLN
INTRAMUSCULAR | Status: AC
  Filled 2012-08-08: qty 1

## 2012-08-08 MED ORDER — GLYCOPYRROLATE 0.2 MG/ML IJ SOLN
INTRAMUSCULAR | Status: DC | PRN
  Administered 2012-08-08: .6 mg via INTRAVENOUS

## 2012-08-08 MED ORDER — ACETAMINOPHEN 10 MG/ML IV SOLN
INTRAVENOUS | Status: AC
  Administered 2012-08-08: 1000 mg via INTRAVENOUS
  Filled 2012-08-08: qty 100

## 2012-08-08 MED ORDER — ARTIFICIAL TEARS OP OINT
TOPICAL_OINTMENT | OPHTHALMIC | Status: AC
  Filled 2012-08-08: qty 3.5

## 2012-08-08 MED ORDER — GLYCOPYRROLATE 0.2 MG/ML IJ SOLN
INTRAMUSCULAR | Status: DC | PRN
  Administered 2012-08-08: 0.2 mg via INTRAVENOUS

## 2012-08-08 MED ORDER — LIDOCAINE HCL (CARDIAC) 20 MG/ML IV SOLN
INTRAVENOUS | Status: AC
  Filled 2012-08-08: qty 5

## 2012-08-08 MED ORDER — IBUPROFEN 100 MG/5ML PO SUSP
200.0000 mg | Freq: Once | ORAL | Status: AC
  Filled 2012-08-08: qty 20

## 2012-08-08 MED ORDER — MEPERIDINE HCL 25 MG/ML IJ SOLN: 6.2500 mg | INTRAMUSCULAR | Status: DC | PRN

## 2012-08-08 MED ORDER — METHOCARBAMOL 500 MG PO TABS
1000.0000 mg | ORAL_TABLET | Freq: Once | ORAL | Status: AC
  Administered 2012-08-08: 1000 mg via ORAL
  Filled 2012-08-08: qty 2

## 2012-08-08 MED ORDER — SCOPOLAMINE 1 MG/3DAYS TD PT72
MEDICATED_PATCH | TRANSDERMAL | Status: AC
  Filled 2012-08-08: qty 1

## 2012-08-08 MED ORDER — FENTANYL CITRATE 0.05 MG/ML IJ SOLN
INTRAMUSCULAR | Status: AC
  Administered 2012-08-08: 50 ug via INTRAVENOUS
  Filled 2012-08-08: qty 2

## 2012-08-08 MED ORDER — NEOSTIGMINE METHYLSULFATE 1 MG/ML IJ SOLN
INTRAMUSCULAR | Status: DC | PRN
  Administered 2012-08-08: 4 mg via INTRAVENOUS

## 2012-08-08 MED ORDER — SCOPOLAMINE 1 MG/3DAYS TD PT72
1.0000 | MEDICATED_PATCH | TRANSDERMAL | Status: DC
  Administered 2012-08-08: 1.5 mg via TRANSDERMAL

## 2012-08-08 MED ORDER — DEXTROSE 5 % IV SOLN
3.0000 g | INTRAVENOUS | Status: AC
  Administered 2012-08-08: 3 g via INTRAVENOUS
  Filled 2012-08-08: qty 3000

## 2012-08-08 MED ORDER — ONDANSETRON HCL 4 MG/2ML IJ SOLN
INTRAMUSCULAR | Status: DC | PRN
  Administered 2012-08-08: 4 mg via INTRAVENOUS

## 2012-08-08 MED ORDER — OXYCODONE-ACETAMINOPHEN 5-325 MG PO TABS: 1.0000 | ORAL_TABLET | ORAL | Status: DC | PRN

## 2012-08-08 MED ORDER — INDIGOTINDISULFONATE SODIUM 8 MG/ML IJ SOLN
INTRAMUSCULAR | Status: DC | PRN
  Administered 2012-08-08: 4 mL via INTRAVENOUS

## 2012-08-08 MED ORDER — STERILE WATER FOR IRRIGATION IR SOLN
Status: DC | PRN
  Administered 2012-08-08: 1000 mL via INTRAVESICAL

## 2012-08-08 MED ORDER — GLYCOPYRROLATE 0.2 MG/ML IJ SOLN
INTRAMUSCULAR | Status: AC
  Filled 2012-08-08: qty 1

## 2012-08-08 MED ORDER — PROPOFOL 10 MG/ML IV EMUL
INTRAVENOUS | Status: DC | PRN
  Administered 2012-08-08: 250 mg via INTRAVENOUS
  Administered 2012-08-08 (×3): 50 mg via INTRAVENOUS

## 2012-08-08 MED ORDER — METOCLOPRAMIDE HCL 5 MG/ML IJ SOLN: 10.0000 mg | Freq: Once | INTRAMUSCULAR | Status: AC | PRN

## 2012-08-08 MED ORDER — ONDANSETRON HCL 4 MG/2ML IJ SOLN
INTRAMUSCULAR | Status: AC
  Filled 2012-08-08: qty 2

## 2012-08-08 MED ORDER — GLYCOPYRROLATE 0.2 MG/ML IJ SOLN
INTRAMUSCULAR | Status: AC
  Filled 2012-08-08: qty 3

## 2012-08-08 MED ORDER — IBUPROFEN 200 MG PO TABS
ORAL_TABLET | ORAL | Status: AC
  Filled 2012-08-08: qty 4

## 2012-08-08 MED ORDER — FENTANYL CITRATE 0.05 MG/ML IJ SOLN
INTRAMUSCULAR | Status: AC
  Filled 2012-08-08: qty 5

## 2012-08-08 MED ORDER — FENTANYL CITRATE 0.05 MG/ML IJ SOLN
25.0000 ug | INTRAMUSCULAR | Status: DC | PRN
  Administered 2012-08-08: 50 ug via INTRAVENOUS

## 2012-08-08 MED ORDER — PHENYLEPHRINE 40 MCG/ML (10ML) SYRINGE FOR IV PUSH (FOR BLOOD PRESSURE SUPPORT)
PREFILLED_SYRINGE | INTRAVENOUS | Status: AC
  Filled 2012-08-08: qty 5

## 2012-08-08 MED ORDER — LACTATED RINGERS IR SOLN
Status: DC | PRN
  Administered 2012-08-08 (×2): 3000 mL

## 2012-08-08 MED ORDER — LACTATED RINGERS IV SOLN
INTRAVENOUS | Status: DC
  Administered 2012-08-08 (×3): via INTRAVENOUS

## 2012-08-08 MED ORDER — MIDAZOLAM HCL 2 MG/2ML IJ SOLN
INTRAMUSCULAR | Status: AC
  Filled 2012-08-08: qty 2

## 2012-08-08 MED ORDER — BUPIVACAINE HCL (PF) 0.25 % IJ SOLN
INTRAMUSCULAR | Status: AC
  Filled 2012-08-08: qty 60

## 2012-08-08 MED ORDER — NEOSTIGMINE METHYLSULFATE 1 MG/ML IJ SOLN
INTRAMUSCULAR | Status: AC
  Filled 2012-08-08: qty 1

## 2012-08-08 MED ORDER — ROCURONIUM BROMIDE 100 MG/10ML IV SOLN
INTRAVENOUS | Status: DC | PRN
  Administered 2012-08-08 (×2): 5 mg via INTRAVENOUS
  Administered 2012-08-08: 10 mg via INTRAVENOUS
  Administered 2012-08-08: 5 mg via INTRAVENOUS
  Administered 2012-08-08: 50 mg via INTRAVENOUS
  Administered 2012-08-08 (×2): 10 mg via INTRAVENOUS
  Administered 2012-08-08: 20 mg via INTRAVENOUS

## 2012-08-08 MED ORDER — INDIGOTINDISULFONATE SODIUM 8 MG/ML IJ SOLN
INTRAMUSCULAR | Status: AC
  Filled 2012-08-08: qty 5

## 2012-08-08 MED ORDER — ACETAMINOPHEN 10 MG/ML IV SOLN: 1000.0000 mg | Freq: Four times a day (QID) | INTRAVENOUS | Status: DC

## 2012-08-08 MED ORDER — FENTANYL CITRATE 0.05 MG/ML IJ SOLN
INTRAMUSCULAR | Status: DC | PRN
  Administered 2012-08-08 (×6): 50 ug via INTRAVENOUS
  Administered 2012-08-08: 100 ug via INTRAVENOUS
  Administered 2012-08-08: 50 ug via INTRAVENOUS

## 2012-08-08 SURGICAL SUPPLY — 62 items
BAG URINE DRAINAGE (UROLOGICAL SUPPLIES) ×4 IMPLANT
BARRIER ADHS 3X4 INTERCEED (GAUZE/BANDAGES/DRESSINGS) ×4 IMPLANT
CATH FOLEY 3WAY  5CC 16FR (CATHETERS) ×1
CATH FOLEY 3WAY 5CC 16FR (CATHETERS) ×3 IMPLANT
CHLORAPREP W/TINT 26ML (MISCELLANEOUS) ×4 IMPLANT
CLOTH BEACON ORANGE TIMEOUT ST (SAFETY) ×4 IMPLANT
CONT PATH 16OZ SNAP LID 3702 (MISCELLANEOUS) ×4 IMPLANT
COVER MAYO STAND STRL (DRAPES) ×4 IMPLANT
COVER TABLE BACK 60X90 (DRAPES) ×8 IMPLANT
COVER TIP SHEARS 8 DVNC (MISCELLANEOUS) ×3 IMPLANT
COVER TIP SHEARS 8MM DA VINCI (MISCELLANEOUS) ×1
DECANTER SPIKE VIAL GLASS SM (MISCELLANEOUS) ×4 IMPLANT
DERMABOND ADVANCED (GAUZE/BANDAGES/DRESSINGS) ×1
DERMABOND ADVANCED .7 DNX12 (GAUZE/BANDAGES/DRESSINGS) ×3 IMPLANT
DEVICE TROCAR PUNCTURE CLOSURE (ENDOMECHANICALS) IMPLANT
DRAPE HUG U DISPOSABLE (DRAPE) ×4 IMPLANT
DRAPE LG THREE QUARTER DISP (DRAPES) ×8 IMPLANT
DRAPE WARM FLUID 44X44 (DRAPE) ×4 IMPLANT
ELECT REM PT RETURN 9FT ADLT (ELECTROSURGICAL) ×4
ELECTRODE REM PT RTRN 9FT ADLT (ELECTROSURGICAL) ×3 IMPLANT
EVACUATOR SMOKE 8.L (FILTER) ×4 IMPLANT
GAUZE VASELINE 3X9 (GAUZE/BANDAGES/DRESSINGS) IMPLANT
GLOVE BIO SURGEON STRL SZ 6.5 (GLOVE) ×4 IMPLANT
GLOVE BIOGEL PI IND STRL 7.0 (GLOVE) ×6 IMPLANT
GLOVE BIOGEL PI INDICATOR 7.0 (GLOVE) ×2
GOWN STRL REIN XL XLG (GOWN DISPOSABLE) ×24 IMPLANT
HEMOSTAT SURGICEL 4X8 (HEMOSTASIS) ×4 IMPLANT
KIT ACCESSORY DA VINCI DISP (KITS) ×1
KIT ACCESSORY DVNC DISP (KITS) ×3 IMPLANT
LEGGING LITHOTOMY PAIR STRL (DRAPES) ×4 IMPLANT
NEEDLE INSUFFLATION 120MM (ENDOMECHANICALS) ×4 IMPLANT
NS IRRIG 1000ML POUR BTL (IV SOLUTION) ×12 IMPLANT
OCCLUDER COLPOPNEUMO (BALLOONS) IMPLANT
PACK LAVH (CUSTOM PROCEDURE TRAY) ×4 IMPLANT
PAD PREP 24X48 CUFFED NSTRL (MISCELLANEOUS) ×8 IMPLANT
PLUG CATH AND CAP STER (CATHETERS) ×4 IMPLANT
POUCH INSTRUMENT 3X5 (STERILIZATION PRODUCTS) ×4 IMPLANT
PROTECTOR NERVE ULNAR (MISCELLANEOUS) ×8 IMPLANT
SCISSORS LAP 5X35 DISP (ENDOMECHANICALS) IMPLANT
SET CYSTO W/LG BORE CLAMP LF (SET/KITS/TRAYS/PACK) ×4 IMPLANT
SET IRRIG TUBING LAPAROSCOPIC (IRRIGATION / IRRIGATOR) ×4 IMPLANT
SOLUTION ELECTROLUBE (MISCELLANEOUS) ×4 IMPLANT
SUT VIC AB 0 CT1 27 (SUTURE) ×3
SUT VIC AB 0 CT1 27XBRD ANTBC (SUTURE) ×9 IMPLANT
SUT VICRYL 0 UR6 27IN ABS (SUTURE) ×4 IMPLANT
SUT VICRYL 4-0 PS2 18IN ABS (SUTURE) ×8 IMPLANT
SYR 50ML LL SCALE MARK (SYRINGE) ×4 IMPLANT
SYSTEM CONVERTIBLE TROCAR (TROCAR) ×4 IMPLANT
TIP UTERINE 5.1X6CM LAV DISP (MISCELLANEOUS) IMPLANT
TIP UTERINE 6.7X10CM GRN DISP (MISCELLANEOUS) IMPLANT
TIP UTERINE 6.7X6CM WHT DISP (MISCELLANEOUS) IMPLANT
TIP UTERINE 6.7X8CM BLUE DISP (MISCELLANEOUS) IMPLANT
TOWEL OR 17X24 6PK STRL BLUE (TOWEL DISPOSABLE) ×12 IMPLANT
TRAY FOLEY BAG SILVER LF 14FR (CATHETERS) ×4 IMPLANT
TROCAR 12M 150ML BLUNT (TROCAR) ×4 IMPLANT
TROCAR 5M 150ML BLDLS (TROCAR) ×4 IMPLANT
TROCAR DISP BLADELESS 8 DVNC (TROCAR) ×3 IMPLANT
TROCAR DISP BLADELESS 8MM (TROCAR) ×1
TROCAR OPTI TIP 12M 100M (ENDOMECHANICALS) ×4 IMPLANT
TROCAR Z-THREAD 12X150 (TROCAR) ×4 IMPLANT
TUBING FILTER THERMOFLATOR (ELECTROSURGICAL) ×4 IMPLANT
WARMER LAPAROSCOPE (MISCELLANEOUS) ×4 IMPLANT

## 2012-08-08 NOTE — Anesthesia Preprocedure Evaluation (Signed)
Anesthesia Evaluation  Patient identified by MRN, date of birth, ID band Patient awake    Reviewed: Allergy & Precautions, H&P , NPO status , Patient's Chart, lab work & pertinent test results  History of Anesthesia Complications (+) PONV  Airway Mallampati: III TM Distance: >3 FB Neck ROM: Full    Dental no notable dental hx. (+) Teeth Intact   Pulmonary asthma , sleep apnea and Continuous Positive Airway Pressure Ventilation ,  breath sounds clear to auscultation  Pulmonary exam normal       Cardiovascular hypertension, Pt. on medications Rhythm:Regular Rate:Normal     Neuro/Psych  Headaches, PSYCHIATRIC DISORDERS Anxiety    GI/Hepatic Neg liver ROS, GERD-  Medicated and Controlled,  Endo/Other  Morbid obesityHyperlipidemia  Renal/GU negative Renal ROS  negative genitourinary   Musculoskeletal negative musculoskeletal ROS (+)   Abdominal (+) + obese,   Peds  Hematology negative hematology ROS (+)   Anesthesia Other Findings   Reproductive/Obstetrics Left Adnexal Mass LLQ pain                           Anesthesia Physical Anesthesia Plan  ASA: III  Anesthesia Plan: General   Post-op Pain Management:    Induction: Intravenous and Cricoid pressure planned  Airway Management Planned: Oral ETT  Additional Equipment:   Intra-op Plan:   Post-operative Plan: Extubation in OR  Informed Consent: I have reviewed the patients History and Physical, chart, labs and discussed the procedure including the risks, benefits and alternatives for the proposed anesthesia with the patient or authorized representative who has indicated his/her understanding and acceptance.   Dental advisory given  Plan Discussed with: CRNA, Anesthesiologist and Surgeon  Anesthesia Plan Comments:         Anesthesia Quick Evaluation

## 2012-08-08 NOTE — Anesthesia Postprocedure Evaluation (Addendum)
  Anesthesia Post-op Note  Anesthesia Post Note  Patient: [name] [name]  Procedure(s) Performed: Procedure(s) (LRB): ROBOTIC ASSISTED SALPINGO OOPHORECTOMY (Left) ROBOTIC ASSISTED LAPAROSCOPIC exctensive LYSIS OF ADHESION (N/A) CYSTOSCOPY (N/A)  Anesthesia type: General  Patient location: PACU  Post pain: Surgical Pain level controlled, continued right shoulder pain  Post assessment: Post-op Vital signs reviewed  Last Vitals:  Filed Vitals:   08/08/12 2115  BP:   Pulse:   Temp: 37.1 C  Resp:   BP 123/76 HR 83 RR 25  Post vital signs: Reviewed  Level of consciousness: Awake, alert  Complications: No apparent anesthesia complications.    Right shoulder pain slightly improved.  Patient and husband both spoke with Dr Cherly Hensen on phone regarding discharge.  Patient and husband instructed on use of sling, application of heat, use of pain medication as needed.  Patient encouraged to use her CPAP at home.  Patient given contact info for Dr Lajoyce Corners for evaluation of shoulder pain if it persists.

## 2012-08-08 NOTE — Anesthesia Postprocedure Evaluation (Addendum)
  Anesthesia Post-op Note  Patient: [name] [name]  Procedure(s) Performed: Procedure(s): ROBOTIC ASSISTED SALPINGO OOPHORECTOMY (Left) ROBOTIC ASSISTED LAPAROSCOPIC exctensive LYSIS OF ADHESION (N/A) CYSTOSCOPY (N/A)  Shoulder films done and read by radiologist as negative for fracture or dislocation, "no acute osseous finding."  Results discussed with patient.  Also discussed plan to apply sling and have patient follow-up with Dr Audrie Lia office for evaluation of shoulder pain.  Patient is calmer.  Discussed that at this point, patient could be discharged home.  She is concerned about going home "with all this pain."  Patient asks about staying overnight.  Will discuss with Dr Cherly Hensen.

## 2012-08-08 NOTE — Transfer of Care (Signed)
Immediate Anesthesia Transfer of Care Note  Patient: [name] [name]  Procedure(s) Performed: Procedure(s): ROBOTIC ASSISTED SALPINGO OOPHORECTOMY (Left) ROBOTIC ASSISTED LAPAROSCOPIC exctensive LYSIS OF ADHESION (N/A) CYSTOSCOPY (N/A)  Patient Location: PACU  Anesthesia Type:General  Level of Consciousness: awake, alert  and oriented  Airway & Oxygen Therapy: Patient Spontanous Breathing and Patient connected to nasal cannula oxygen  Post-op Assessment: Report given to PACU RN and Post -op Vital signs reviewed and stable  Post vital signs: stable  Complications: No apparent anesthesia complications

## 2012-08-08 NOTE — Anesthesia Postprocedure Evaluation (Addendum)
  Anesthesia Post-op Note  Patient: [name] [name]  Procedure(s) Performed: Procedure(s): ROBOTIC ASSISTED SALPINGO OOPHORECTOMY (Left) ROBOTIC ASSISTED LAPAROSCOPIC exctensive LYSIS OF ADHESION (N/A) CYSTOSCOPY (N/A)  Patient complains of shoulder pain following 5+ hrs in OR for Robotic LSO/LOA.  Initially it was bilateral, but now after several doses of fentanyl her left shoulder has improved.  Has no pain in abdomen.  Right shoulder is extremely painful.  Movement is limited by pain.  No obvious visual deformity, but she does report tenderness to palpation of both shoulders, right greater than left.  She reports her pain is a "50/10".  Unable to determine if it is stiffness, soreness, sharp pain, etc - patient will only say "it just hurts."  She denies history of prior shoulder pain or injury.  Will give PO robaxin and ibuprofen, and IV acetaminophen.  Have made attempts at repositioning/ROM and will apply heat or ice as tolerated/preferred.  Will reassess.

## 2012-08-08 NOTE — Brief Op Note (Signed)
08/08/2012  6:20 PM  PATIENT:  [name] [name]  47 y.o. female  PRE-OPERATIVE DIAGNOSIS:  Chronic LLQ pain, Left Adnexal Mass    POST-OPERATIVE DIAGNOSIS:  left  Ovarian cyst, chronic LLQ pain, extensive abdominopelvic adhesions  PROCEDURE:  DaVinci robotic left salpingo-oophrectomy, extensive robotic laparoscopic lysis of adhesions, cystoscopy  SURGEON:  Surgeon(s) and Role:    * Serita Kyle, MD - Primary    *  PHYSICIAN ASSISTANT:   ASSISTANTS: Shea Evans, M.D.   ANESTHESIA:   general  FINDINGS; extensive anterior abdominal wall omental adhesions, nl liver edge, GB seen Left adnexa retroperitoneal encased in adhesions, left peritoneal cyst, ovary with multiple cystic structures and twisted on self. uterer intraop not seen. Cystoscopy done: both ureteral jets noted  EBL:  Total I/O In: 2300 [I.V.:2300] Out: 550 [Urine:350; Blood:200]  BLOOD ADMINISTERED:none  DRAINS: none   LOCAL MEDICATIONS USED:  MARCAINE     SPECIMEN:  Source of Specimen:  left tube and ovary w/ peritoneal cyst  DISPOSITION OF SPECIMEN:  PATHOLOGY  COUNTS:  YES  TOURNIQUET:  * No tourniquets in log *  DICTATION: .Other Dictation: Dictation Number [id number]  PLAN OF CARE: Discharge to home after PACU  PATIENT DISPOSITION:  PACU - hemodynamically stable.   Delay start of Pharmacological VTE agent (>24hrs) due to surgical blood loss or risk of bleeding: yes

## 2012-08-08 NOTE — Anesthesia Postprocedure Evaluation (Signed)
  Anesthesia Post-op Note  Patient: [name] [name]  Procedure(s) Performed: Procedure(s): ROBOTIC ASSISTED SALPINGO OOPHORECTOMY (Left) ROBOTIC ASSISTED LAPAROSCOPIC exctensive LYSIS OF ADHESION (N/A) CYSTOSCOPY (N/A)  Reassessed patient in PACU.  Continued right shoulder pain despite interventions.  She reports no improvement.  I called Dr. Lajoyce Corners who is on for orthopedic consults to discuss her shoulder pain and appropriate x-rays to r/o shoulder dislocation.  He recommended AP and axillary plain films of her right shoulder, which I have ordered.  If films are negative for dislocation, he recommended we place her shoulder in a sling and refer her to his office [phone]) for evaluation of her shoulder pain.  We again attempted repositioning, and will give additional IV fentanyl for pain.  She takes PO xanax at home, so we will try a dose of that as well to help relax her.

## 2012-08-09 MED ORDER — OXYCODONE-ACETAMINOPHEN 5-325 MG PO TABS
2.0000 | ORAL_TABLET | Freq: Once | ORAL | Status: AC
  Administered 2012-08-09: 2 via ORAL

## 2012-08-09 MED ORDER — OXYCODONE-ACETAMINOPHEN 5-325 MG PO TABS
ORAL_TABLET | ORAL | Status: AC
  Filled 2012-08-09: qty 2

## 2012-08-09 NOTE — Op Note (Signed)
[name], [name]          ACCOUNT NO.:  [id number]  MEDICAL RECORD NO.:  [id number]  LOCATION:  [facility]                          [facility]AC[facility]  PHYSICIAN:  Maxie Better, M.D.DATE OF BIRTH:  1965-04-24  DATE OF PROCEDURE:  08/08/2012 DATE OF DISCHARGE:  08/09/2012                              OPERATIVE REPORT   PREOPERATIVE DIAGNOSIS:  Chronic left lower quadrant pain, left adnexal mass.  PROCEDURE:  Da Vinci robotic left salpingo-oophorectomy, extensive laparoscopic lysis of adhesions, cystoscopy.  POSTOPERATIVE DIAGNOSES:  Chronic left lower quadrant pain, severe abdominal pelvic adhesions, morbid obesity.  ANESTHESIA:  General.  SURGEON:  Maxie Better, M.D.  ASSISTANT:  Darryl Nestle, MD  PROCEDURE:  Under adequate general anesthesia, the patient was placed in the dorsal lithotomy position.  She was positioned for robotic surgery. The patient was sterilely prepped and draped in usual fashion.  An indwelling Foley catheter three-way was sterilely placed.  A sponge on a stick was placed in the vagina due to the patient's prior history of a hysterectomy.  Attention was then turned to the abdomen, which was obese.  There was a vertical subumbilical incision noted.  A supraumbilical incision was made after 0.25% Marcaine was injected. Veress needle was introduced several times, however, despite assessment of placement it was felt that the needle was not to be in the correct Intraabdominal area.  At that point, the decision was made to do an open incision.  The fascia was subsequently identified, opened vertically, and the parietal peritoneum was bluntly opened.  Pursestring suture of 0- Vicryl was then placed, and an Hasson cannula was placed.  The robotic camera was then inserted through that port confirming that indeed the needle was in the intra-abdominal cavity without incident, however, it was just not insufflating correctly.  Nonetheless, the abdomen  was further insufflated.  The patient was then placed in Trendelenburg position.  She was not put in a full Trendelenburg position due to the fact that the patient was obese and concerned for possible  difficulty with ventilation of the patient during surgery.  It was then noted that the patient had extensive omental adhesion to the anterior abdominal wall carried down to just above the bladder reflection.  Laterally, the abdomen was free of adhesions.  The pelvis  could not be seen due to the adhesions and the obesity.  Nonetheless, two 8 mm robotic ports sites were placed on the left side of the abdomen 10 cm apart. In the right lower quadrant, a 5 mm extra long assistant port was placed and an 8-mm robotic port was placed to the right of the camera port.  The robot was then docked to the patient's left side. Due to the presence of the adhesions, arm 1 had monopolar scissors placed, arm 2 actually had the ProGrasp placed, and arm 3 had the PK dissector.  I then went to the surgical console.  At the surgical console, the left side of the abdomen could not be seen and therefore the scissors on the right was used to take down the adhesions from the right side superiorly and inferiorly.  In doing so, then the ProGrasp was then utilized to be able to move the omental adhesions  that was still present in the midline allowing for the left side of the omental adhesions to be lysed.  With careful lysis of the adhesions, it took twenty minutes to remove the omental adhesion.  Some of the right parietal peritoneum with exposure of the rectus muscle was noted and cauterization of bleeding at that area was necessary to achieve hemostasis.  Once this was done, the attention was then turned to the pelvis.  The vaginal cuff was identified using the sponge  stick in the vagina and it was then noted that the left side had adhesions with the bowels overlying what would be the location of the left adnexa.  The  right adnexa had already been removed.  Starting with the adhesions that was visible carefully dissection of those adhesions off the left side the bowels and pelvic sidewall  unroofed partially  the fimbriated end of the fallopian tube.  The ovary still at that point was not seen.  Continued dissection medially and inferiorly resulted in identifying eventually the adhesions in the posterior cul-de- Sac and vaginal cuff laterally. In doing so,  there was a cystic mass related to the ovary seen adherent to pelvic sidewall and ovarian fossa.  Going up to the pelvic brim and taking down adhesions in order to try to facilitate seeing the left ureter  was not Successful.  Bleeding was encountered which carefully was cauterized. Decision was then made to open the retroperitoneal space on the left side, which was then done and dissection was carried down, but the ureter could not still be seen well.  The remnant of the round ligament was also not seen at the site for identification of where the pedicle would be for that left ovary.  Once that dissection was done, the left ovary with some more dissection resulted in a small window being identified posteriorly and inferiorly and that allowed for more evidence with the ovary being a buried retroperitoneum.  There was continued oozing of blood from just the continued sharp dissection and careful dissection that was being done.  Proximally on the left,  a peritoneal cyst was identified and removed separate from the ovary and tube that was being removed itself. The left ureter was constantly looked for throughout the continued lysing of adhesions and dissection of the ovary off the side wall.  Bleeding from this the fimbria was encountered.  Re-entry into the retroperitoneal space trying to find the ureter was still without success.  The IP was identified at the pelvic brim, but could not identify the ureter.  The dissection was Continued with removal of  bowel adhesions of the ovary. The ovarian cyst incidentally ruptured and hemorrhagic fluid was noted and suctioned.  The adnexal complex was lifted up with the ProGrasp. The left ovary with tube was then able to freed up with the ProGrasp being placed back in arm #3 and the PK was put in arm #2.  Once this was done, it was  then noted that the ovary was everted and finding those areas of eversion and opening that up was then performed. Proximally the IP ligament area was cauterized, cut, and the ovary in its entirety including adhesions was ultimately removed.  The ureter was not identified. Adhesions in the pelvis was continued to be dissected off.  The medial wall of the peritoneum on that left side was identified. Any remnant of ovarian tissue that could remain was also carefully inspected and what appeared to potentially be a remnant was also removed.  Given the fact that the ureter was not seen, the decision was made to perform cystoscopy.  At that point, the umbilical site camera was undocked, the arm #1 was undocked.  The endobag was placed in the supraumbilical site and the 8 mm robotic camera was placed in the right robotic port site.  Using the third arm, the specimen was placed in the bag and kept at the umbilical region because it was initially too big to be taken out.  While that was being done, I then left the surgical site console to perform the cystoscopy. The Foley catheter was removed, the cystoscope was inserted.  Indigo carmine was given intravenously and on entering the bladder, it was then noted that the first ureter that was seen on the left which was spurting out blue dye and on the right also confirmed blue dye egressing.  The cystoscope was then removed.  The Foley catheter then reinserted to deflate the bladder and attention was then turned back to the console.  At that point, the endobag was removed through the umbilical site by taking out the port.  Once that was  removed, the port was reinserted in the supraumbilical site.  The robot was re-docked, all instruments were then reinserted, the robotic instruments had been removed so that there was no inadvertent injury while the manipulation was being done suprapubically.  Once this was all re-docked completely and instruments all placed, the pelvis again was irrigated.  Sequential inspection of the left retroperitoneal space for bleeding, irrigation, suctioning the area near the vaginal cuff posteriorly where the lysis of adhesions occurred all was inspected, good hemostasis was noted.  The liver edge had been noted to be normal.  At that point, the procedure was felt to be completed and the instruments for the robot was removed, the robot was undocked and I went back to the patient's bedside. The pelvis was inspected, the fluid in the upper abdomen was suctioned. Surgicel was placed in the retroperitoneal space, and Interceed was placed in the anterior abdominal wall due to the denudement of peritoneum.  The robotic ports were all then removed under direct visualization, and the supraumbilical site was then removed, taking care not to bring up any underlying structures.  The pursestring was closed and the skin incisions were then approximated using 4-0 Vicryl subcuticular stitches.  The vaginal sponge stick was removed.  The foley was then removed.  SPECIMEN:  Left adnexa, left tube and ovary, and peritoneal cyst, all sent to Pathology.  ESTIMATED BLOOD LOSS:  About 200.  INTRAOPERATIVE FLUID:  2 L.  URINE OUTPUT:  250 mL.  Sponge and instrument counts x2 was correct.  COMPLICATIONS:  None.  The patient tolerated the procedure well, was transferred to the recovery room in stable condition.     Maxie Better, M.D.     [place]/MEDQ  D:  08/09/2012  T:  08/09/2012  Job:  [id number]

## 2012-08-10 ENCOUNTER — Encounter (HOSPITAL_COMMUNITY): Payer: Self-pay | Admitting: Obstetrics and Gynecology

## 2012-08-12 ENCOUNTER — Other Ambulatory Visit (HOSPITAL_COMMUNITY): Payer: Self-pay | Admitting: Obstetrics and Gynecology

## 2012-08-12 ENCOUNTER — Ambulatory Visit (HOSPITAL_COMMUNITY)
Admission: RE | Admit: 2012-08-12 | Discharge: 2012-08-12 | Disposition: A | Discharge status: Home / Self Care | Payer: 59 | Source: Ambulatory Visit | Attending: Obstetrics and Gynecology | Admitting: Obstetrics and Gynecology

## 2012-08-12 ENCOUNTER — Other Ambulatory Visit: Payer: Self-pay | Admitting: Obstetrics and Gynecology

## 2012-08-12 DIAGNOSIS — R209 Unspecified disturbances of skin sensation: Secondary | ICD-10-CM | POA: Insufficient documentation

## 2012-08-12 DIAGNOSIS — M79609 Pain in unspecified limb: Secondary | ICD-10-CM | POA: Insufficient documentation

## 2012-08-12 DIAGNOSIS — Z9079 Acquired absence of other genital organ(s): Secondary | ICD-10-CM | POA: Insufficient documentation

## 2012-08-12 DIAGNOSIS — R600 Localized edema: Secondary | ICD-10-CM

## 2012-08-12 NOTE — Progress Notes (Signed)
VASCULAR LAB PRELIMINARY  PRELIMINARY  PRELIMINARY  PRELIMINARY  Right lower extremity venous duplex completed.    Preliminary report:  Right:  No evidence of DVT, superficial thrombosis, or Baker's cyst.  Ashaki Frosch, RVT 08/12/2012, 8:05 PM

## 2012-09-12 ENCOUNTER — Other Ambulatory Visit: Payer: Self-pay | Admitting: Orthopedic Surgery

## 2012-09-12 DIAGNOSIS — M79601 Pain in right arm: Secondary | ICD-10-CM

## 2012-09-12 DIAGNOSIS — R29898 Other symptoms and signs involving the musculoskeletal system: Secondary | ICD-10-CM

## 2012-09-18 ENCOUNTER — Other Ambulatory Visit: Payer: 59

## 2012-09-18 ENCOUNTER — Ambulatory Visit
Admission: RE | Admit: 2012-09-18 | Discharge: 2012-09-18 | Disposition: A | Discharge status: Home / Self Care | Payer: 59 | Source: Ambulatory Visit | Attending: Orthopedic Surgery | Admitting: Orthopedic Surgery

## 2012-09-18 DIAGNOSIS — R29898 Other symptoms and signs involving the musculoskeletal system: Secondary | ICD-10-CM

## 2012-09-18 DIAGNOSIS — M79601 Pain in right arm: Secondary | ICD-10-CM

## 2012-12-01 ENCOUNTER — Other Ambulatory Visit: Payer: Self-pay | Admitting: Orthopedic Surgery

## 2012-12-01 DIAGNOSIS — M545 Low back pain: Secondary | ICD-10-CM

## 2012-12-05 ENCOUNTER — Other Ambulatory Visit: Payer: Self-pay | Admitting: Family Medicine

## 2012-12-05 ENCOUNTER — Ambulatory Visit
Admission: RE | Admit: 2012-12-05 | Discharge: 2012-12-05 | Disposition: A | Discharge status: Home / Self Care | Payer: 59 | Source: Ambulatory Visit | Attending: Orthopedic Surgery | Admitting: Orthopedic Surgery

## 2012-12-05 DIAGNOSIS — M545 Low back pain: Secondary | ICD-10-CM

## 2012-12-29 ENCOUNTER — Ambulatory Visit
Admission: RE | Admit: 2012-12-29 | Discharge: 2012-12-29 | Disposition: A | Discharge status: Home / Self Care | Payer: 59 | Source: Ambulatory Visit | Attending: Family Medicine | Admitting: Family Medicine

## 2012-12-29 ENCOUNTER — Ambulatory Visit: Payer: 59

## 2012-12-29 DIAGNOSIS — Z1231 Encounter for screening mammogram for malignant neoplasm of breast: Secondary | ICD-10-CM

## 2013-01-24 ENCOUNTER — Encounter: Payer: 59 | Attending: Family Medicine | Admitting: Dietician

## 2013-01-24 VITALS — Ht 64.0 in | Wt 277.7 lb

## 2013-01-24 DIAGNOSIS — Z713 Dietary counseling and surveillance: Secondary | ICD-10-CM | POA: Insufficient documentation

## 2013-01-24 DIAGNOSIS — E119 Type 2 diabetes mellitus without complications: Secondary | ICD-10-CM

## 2013-01-25 ENCOUNTER — Encounter: Payer: Self-pay | Admitting: Dietician

## 2013-01-25 NOTE — Patient Instructions (Signed)
Goals:  Monitor glucose levels as instructed by your doctor  Bring food record and glucose log to your next nutrition visit 

## 2013-01-25 NOTE — Progress Notes (Signed)
Patient was seen on 01/24/13 for the first of a series of three diabetes self-management courses at the [facility].  Current HbA1c: 6.9 in 11/2012  The following learning objectives were met by the patient during this class:  Describe diabetes  State some common risk factors for diabetes  Defines the role of glucose and insulin  Identifies type of diabetes and pathophysiology  Describe the relationship between diabetes and cardiovascular risk  State the members of the Healthcare Team  States the rationale for glucose monitoring  State when to test glucose  State their individual Target Range  State the importance of logging glucose readings  Describe how to interpret glucose readings  Identifies A1C target  Explain the correlation between A1c and eAG values  State symptoms and treatment of high blood glucose  State symptoms and treatment of low blood glucose  Explain proper technique for glucose testing  Identifies proper sharps disposal  Handouts given during class include:  Living Well with Diabetes book  Carb Counting and Meal Planning book  Meal Plan Card  Carbohydrate guide  Meal planning worksheet  Low Sodium Flavoring Tips  The diabetes portion plate  Low Carbohydrate Snack Suggestions  A1c to eAG Conversion Chart  Diabetes Medications  Stress Management  Diabetes Recommended Care Schedule  Diabetes Success Plan  Core Class Satisfaction Survey  Your patient has identified their diabetes care support plan as:  [facility]  Staff  Follow-Up Plan:  Attend core 2

## 2013-01-31 ENCOUNTER — Encounter: Payer: 59 | Attending: Family Medicine

## 2013-01-31 DIAGNOSIS — E119 Type 2 diabetes mellitus without complications: Secondary | ICD-10-CM | POA: Insufficient documentation

## 2013-01-31 DIAGNOSIS — Z713 Dietary counseling and surveillance: Secondary | ICD-10-CM | POA: Insufficient documentation

## 2013-02-01 NOTE — Progress Notes (Signed)
Patient was seen on 01/31/13 for the second of a series of three diabetes self-management courses at the [facility]. The following learning objectives were met by the patient during this class:   Describe the role of different macronutrients on glucose  Explain how carbohydrates affect blood glucose  State what foods contain the most carbohydrates  Demonstrate carbohydrate counting  Demonstrate how to read Nutrition Facts food label  Describe effects of various fats on heart health  Describe the importance of good nutrition for health and healthy eating strategies  Describe techniques for managing your shopping, cooking and meal planning  List strategies to follow meal plan when dining out  Describe the effects of alcohol on glucose and how to use it safely  Follow-Up Plan:  Attend Core 3  Work towards following your personal food plan.    

## 2013-02-07 DIAGNOSIS — E119 Type 2 diabetes mellitus without complications: Secondary | ICD-10-CM

## 2013-02-08 NOTE — Progress Notes (Signed)
Patient was seen on 02/07/13 for the third of a series of three diabetes self-management courses at the [facility]. The following learning objectives were met by the patient during this class:    State the amount of activity recommended for healthy living   Describe activities suitable for individual needs   Identify ways to regularly incorporate activity into daily life   Identify barriers to activity and ways to over come these barriers  Identify diabetes medications being personally used and their primary action for lowering glucose and possible side effects   Describe role of stress on blood glucose and develop strategies to address psychosocial issues   Identify diabetes complications and ways to prevent them  Explain how to manage diabetes during illness   Evaluate success in meeting personal goal   Establish 2-3 goals that they will plan to diligently work on until they return for the free 68-month follow-up visit  Your patient has established the following 4 month goals in their individualized success plan: I will increase my activity level at least 2 days a week I will test my glucose at least 3 times a day, 1 days a week  To help manage my stress, I will NOT talk on the phone for an hour after work on Tuesday and Thursday  Your patient has identified these potential barriers to change:  None stated  Your patient has identified their diabetes self-care support plan as  None stated, support group available

## 2013-05-03 ENCOUNTER — Encounter: Payer: Self-pay | Admitting: General Surgery

## 2013-05-03 ENCOUNTER — Encounter: Payer: Self-pay | Admitting: Cardiology

## 2013-05-03 DIAGNOSIS — G4733 Obstructive sleep apnea (adult) (pediatric): Secondary | ICD-10-CM

## 2013-05-16 ENCOUNTER — Ambulatory Visit: Payer: 59 | Admitting: Cardiology

## 2013-06-06 ENCOUNTER — Encounter: Payer: Self-pay | Admitting: Cardiology

## 2013-06-06 ENCOUNTER — Ambulatory Visit (INDEPENDENT_AMBULATORY_CARE_PROVIDER_SITE_OTHER): Payer: 59 | Admitting: Cardiology

## 2013-06-06 VITALS — BP 126/74 | HR 64 | Ht 64.0 in | Wt 278.0 lb

## 2013-06-06 DIAGNOSIS — E669 Obesity, unspecified: Secondary | ICD-10-CM

## 2013-06-06 DIAGNOSIS — I10 Essential (primary) hypertension: Secondary | ICD-10-CM | POA: Insufficient documentation

## 2013-06-06 DIAGNOSIS — G4733 Obstructive sleep apnea (adult) (pediatric): Secondary | ICD-10-CM

## 2013-06-06 NOTE — Progress Notes (Signed)
[address] [place], [place]  [address] Phone: [phone] Fax:  [phone]  Date:  06/06/2013   ID:  [name] [name], DOB July 15, 1965, MRN [id number]  PCP:  Hollice Espy, MD  Sleep Medicine:  Armanda Magic, MD   History of Present Illness: [name] [name] is a 48 y.o. female with a history of OSA, obesity and HTN presents today for followup.  She is doing well.  She tolerates her CPAP without any problems.  She feels rested when she gets up and has no daytime sleepiness.  She tolerates her mask and feels the pressure is adequate.  She says she has not been very compliant with her CPAP recently due to traveling back and forth to [place] due to the death of her cousin.  She has not been exercising due to a recent leg injury   Wt Readings from Last 3 Encounters:  01/25/13 277 lb 11.2 oz (125.964 kg)  08/05/12 288 lb (130.636 kg)     Past Medical History  Diagnosis Date  . SVD (spontaneous vaginal delivery)     x 2  . PONV (postoperative nausea and vomiting)   . Asthma   . Hyperlipidemia     diet controlled  . Seasonal allergies   . Sleep apnea     cpap setting 70  . Headache(784.0)     on rx med  . Arthritis     lower back, knee  . Anemia     history  . GERD (gastroesophageal reflux disease)   . Anxiety   . Mental disorder   . Esophageal reflux   . Obesity   . Depression   . Migraines   . H/O gestational diabetes mellitus, not currently pregnant   . OSA (obstructive sleep apnea)     Mild w AHI 14/hr now on CPAP at 9cm H2O  . Hypertension     Current Outpatient Prescriptions  Medication Sig Dispense Refill  . albuterol (PROVENTIL HFA;VENTOLIN HFA) 108 (90 BASE) MCG/ACT inhaler Inhale 2 puffs into the lungs every 6 (six) hours as needed for wheezing or shortness of breath.      . ALPRAZolam (XANAX) 0.5 MG tablet Take 0.5 mg by mouth at bedtime as needed for sleep or anxiety.      Marland Kitchen amLODipine (NORVASC) 5 MG tablet Take 5 mg by mouth  daily.      . budesonide-formoterol (SYMBICORT) 160-4.5 MCG/ACT inhaler Inhale 2 puffs into the lungs 2 (two) times daily.      . DULoxetine (CYMBALTA) 60 MG capsule Take 60 mg by mouth daily.      Marland Kitchen esomeprazole (NEXIUM) 40 MG capsule Take 40 mg by mouth daily before breakfast.      . fluticasone (FLONASE) 50 MCG/ACT nasal spray Place 2 sprays into the nose daily.      . meclizine (ANTIVERT) 25 MG tablet Take 25 mg by mouth 2 (two) times daily as needed for dizziness.      . montelukast (SINGULAIR) 10 MG tablet Take 10 mg by mouth at bedtime.      Marland Kitchen oxyCODONE-acetaminophen (ROXICET) 5-325 MG per tablet Take 1 tablet by mouth every 4 (four) hours as needed for pain.  30 tablet  0  . ranitidine (ZANTAC) 150 MG tablet Take 150 mg by mouth 2 (two) times daily as needed for heartburn.      . SUMAtriptan (IMITREX) 100 MG tablet Take 100 mg by mouth every 2 (two) hours as needed for migraine.      Marland Kitchen  temazepam (RESTORIL) 15 MG capsule Take 15 mg by mouth at bedtime.      . topiramate (TOPAMAX) 25 MG tablet Take 75 mg by mouth at bedtime.      . traMADol (ULTRAM) 50 MG tablet Take 50 mg by mouth every 6 (six) hours as needed for pain.      Marland Kitchen. triamterene-hydrochlorothiazide (DYAZIDE) 37.5-25 MG per capsule Take 1 capsule by mouth daily.      . Vitamin D, Ergocalciferol, (DRISDOL) 50000 UNITS CAPS Take 50,000 Units by mouth 3 (three) times a week. Mondays, wednesdays, fridays       No current facility-administered medications for this visit.    Allergies:    Allergies  Allergen Reactions  . Iodine Anaphylaxis    "Seafood allergy"    Social History:  The patient  reports that she has never smoked. She has never used smokeless tobacco. She reports that she does not drink alcohol or use illicit drugs.   Family History:  The patient's family history includes CAD in her maternal grandfather and maternal grandmother; Colon cancer in her maternal uncle and paternal uncle; Diabetes in her maternal  grandmother; Diabetes Mellitus II in her mother.   ROS:  Please see the history of present illness.      All other systems reviewed and negative.   PHYSICAL EXAM: VS:  There were no vitals taken for this visit. Well nourished, well developed, in no acute distress HEENT: normal Neck: no JVD Cardiac:  normal S1, S2; RRR; no murmur Lungs:  clear to auscultation bilaterally, no wheezing, rhonchi or rales Abd: soft, nontender, no hepatomegaly Ext: no edema Skin: warm and dry Neuro:  CNs 2-12 intact, no focal abnormalities noted  ASSESSMENT AND PLAN:  1. OSA on CPAP and tolerating well -I will get a download from her DME 2. HTN - well controlled -continue amlodipine/Dyazide 3. Obesity - I encouraged her to try to get into an exercise program once her leg injury heals  Followup with me in 6 months  Signed, Armanda Magicraci Denae Zulueta, MD 06/06/2013 4:27 PM

## 2013-06-06 NOTE — Patient Instructions (Signed)
Your physician wants you to follow-up in: 6 months with Dr. Turner. You will receive a reminder letter in the mail two months in advance. If you don't receive a letter, please call our office to schedule the follow-up appointment.  Your physician recommends that you continue on your current medications as directed. Please refer to the Current Medication list given to you today.  

## 2013-06-07 ENCOUNTER — Encounter: Payer: 59 | Attending: Family Medicine | Admitting: *Deleted

## 2013-06-07 DIAGNOSIS — Z713 Dietary counseling and surveillance: Secondary | ICD-10-CM | POA: Insufficient documentation

## 2013-06-07 DIAGNOSIS — E119 Type 2 diabetes mellitus without complications: Secondary | ICD-10-CM

## 2013-06-07 NOTE — Progress Notes (Signed)
  Patient was seen on 06/07/13 for their 3 month follow-up as a part of the diabetes self-management courses at the [facility]. The following learning objectives were met by your patient during this course:  Patient self reports the following:  Doing much better with portion control  Diabetes control has improved since diabetes self-management training: YES,  Number of days blood glucose is >200: NONE Last MD appointment for diabetes: IN MID FEBRUARY Changes in treatment plan: NO Confidence with ability to manage diabetes: EXCELLENT UNTIL FAMILY Areas for improvement with diabetes self-care: RESUME PORTION CONTROL Willingness to participate in diabetes support group: YES  Your patient has established the following 4 month goals in their individualized success plan:  I will increase my activity level at least 2 days a week - WALKING ON [place] I will test my glucose at least once a day, 3 days a week - ACCOMPLISHED To help manage my stress, I will NOT talk on the phone for an hour after work on Tuesday and Thursday - PLAN TO RESUME THAT Your patient has identified these potential barriers to change:  DEALING WITH GRIEF RIGHT NOW, OFFERED RESOURCES FOR GRIEF COUNSELING Your patient has identified their diabetes self-care support plan as  ONGOING DIABETES EDUCATOR SUPPORT FOR NOW   Please see Diabetes Flow sheet for findings related to patient's self-care.  Follow-Up Plan:

## 2013-06-26 ENCOUNTER — Encounter: Payer: Self-pay | Admitting: Cardiology

## 2013-07-07 ENCOUNTER — Encounter: Payer: Self-pay | Admitting: General Surgery

## 2013-08-11 ENCOUNTER — Other Ambulatory Visit: Payer: Self-pay | Admitting: Orthopedic Surgery

## 2013-08-11 DIAGNOSIS — M25572 Pain in left ankle and joints of left foot: Secondary | ICD-10-CM

## 2013-08-16 ENCOUNTER — Other Ambulatory Visit: Payer: 59

## 2013-10-04 ENCOUNTER — Encounter: Payer: Self-pay | Admitting: Cardiology

## 2013-10-19 ENCOUNTER — Encounter: Payer: Self-pay | Admitting: Cardiology

## 2013-11-21 ENCOUNTER — Ambulatory Visit
Admission: RE | Admit: 2013-11-21 | Discharge: 2013-11-21 | Disposition: A | Discharge status: Home / Self Care | Payer: 59 | Source: Ambulatory Visit | Attending: Family Medicine | Admitting: Family Medicine

## 2013-11-21 ENCOUNTER — Other Ambulatory Visit: Payer: Self-pay | Admitting: Family Medicine

## 2013-11-21 DIAGNOSIS — R52 Pain, unspecified: Secondary | ICD-10-CM

## 2013-12-01 ENCOUNTER — Other Ambulatory Visit: Payer: Self-pay

## 2013-12-01 DIAGNOSIS — Z1231 Encounter for screening mammogram for malignant neoplasm of breast: Secondary | ICD-10-CM

## 2013-12-26 ENCOUNTER — Other Ambulatory Visit: Payer: Self-pay | Admitting: Family Medicine

## 2013-12-26 DIAGNOSIS — M545 Low back pain, unspecified: Secondary | ICD-10-CM

## 2013-12-28 ENCOUNTER — Ambulatory Visit
Admission: RE | Admit: 2013-12-28 | Discharge: 2013-12-28 | Disposition: A | Discharge status: Home / Self Care | Payer: 59 | Source: Ambulatory Visit | Attending: Family Medicine | Admitting: Family Medicine

## 2013-12-28 DIAGNOSIS — M545 Low back pain, unspecified: Secondary | ICD-10-CM

## 2014-01-01 ENCOUNTER — Ambulatory Visit
Admission: RE | Admit: 2014-01-01 | Discharge: 2014-01-01 | Disposition: A | Discharge status: Home / Self Care | Payer: 59 | Source: Ambulatory Visit

## 2014-01-01 DIAGNOSIS — Z1231 Encounter for screening mammogram for malignant neoplasm of breast: Secondary | ICD-10-CM

## 2014-01-03 ENCOUNTER — Other Ambulatory Visit: Payer: Self-pay | Admitting: Family Medicine

## 2014-01-03 DIAGNOSIS — R928 Other abnormal and inconclusive findings on diagnostic imaging of breast: Secondary | ICD-10-CM

## 2014-01-11 ENCOUNTER — Other Ambulatory Visit: Payer: 59

## 2014-02-07 ENCOUNTER — Ambulatory Visit
Admission: RE | Admit: 2014-02-07 | Discharge: 2014-02-07 | Disposition: A | Discharge status: Home / Self Care | Payer: 59 | Source: Ambulatory Visit | Attending: Family Medicine | Admitting: Family Medicine

## 2014-02-07 DIAGNOSIS — R928 Other abnormal and inconclusive findings on diagnostic imaging of breast: Secondary | ICD-10-CM

## 2014-03-02 ENCOUNTER — Other Ambulatory Visit: Payer: Self-pay | Admitting: Family Medicine

## 2014-03-02 DIAGNOSIS — N6001 Solitary cyst of right breast: Secondary | ICD-10-CM

## 2014-03-16 ENCOUNTER — Other Ambulatory Visit: Payer: Self-pay | Admitting: Family Medicine

## 2014-03-16 ENCOUNTER — Ambulatory Visit
Admission: RE | Admit: 2014-03-16 | Discharge: 2014-03-16 | Disposition: A | Discharge status: Home / Self Care | Payer: 59 | Source: Ambulatory Visit | Attending: Family Medicine | Admitting: Family Medicine

## 2014-03-16 DIAGNOSIS — N6001 Solitary cyst of right breast: Secondary | ICD-10-CM

## 2014-03-26 ENCOUNTER — Other Ambulatory Visit: Payer: 59

## 2014-03-26 ENCOUNTER — Other Ambulatory Visit: Payer: Self-pay | Admitting: Orthopedic Surgery

## 2014-03-26 DIAGNOSIS — N644 Mastodynia: Secondary | ICD-10-CM

## 2014-03-27 ENCOUNTER — Ambulatory Visit (INDEPENDENT_AMBULATORY_CARE_PROVIDER_SITE_OTHER): Payer: 59 | Admitting: Cardiology

## 2014-03-27 VITALS — Ht 64.0 in | Wt 298.4 lb

## 2014-03-27 DIAGNOSIS — I10 Essential (primary) hypertension: Secondary | ICD-10-CM

## 2014-03-27 DIAGNOSIS — E669 Obesity, unspecified: Secondary | ICD-10-CM

## 2014-03-27 DIAGNOSIS — G4733 Obstructive sleep apnea (adult) (pediatric): Secondary | ICD-10-CM

## 2014-03-27 NOTE — Patient Instructions (Signed)
Your physician wants you to follow-up in: 6 months with Dr. Turner. You will receive a reminder letter in the mail two months in advance. If you don't receive a letter, please call our office to schedule the follow-up appointment.  

## 2014-03-27 NOTE — Progress Notes (Signed)
[address] [place], [place]  [address] Phone: [phone] Fax:  [phone]  Date:  03/27/2014   ID:  [name] [name], DOB 06/08/1965, MRN [id number]  PCP:  Hollice EspyGATES,DONNA RUTH, MD  Sleep Medicine:  Armanda Magicraci Turner, MD   History of Present Illness: [name] [name] is a 48 y.o. female with a history of OSA, obesity and HTN presents today for followup. She is doing well. She tolerates her CPAP without any problems. She feels rested for the most part when she gets up and has no daytime sleepiness. She tolerates her mask and feels the pressure is adequate.   Wt Readings from Last 3 Encounters:  03/27/14 298 lb 6.4 oz (135.353 kg)  06/06/13 278 lb (126.1 kg)  01/25/13 277 lb 11.2 oz (125.964 kg)     Past Medical History  Diagnosis Date  . SVD (spontaneous vaginal delivery)     x 2  . PONV (postoperative nausea and vomiting)   . Asthma   . Hyperlipidemia     diet controlled  . Seasonal allergies   . Sleep apnea     cpap setting 70  . Headache(784.0)     on rx med  . Arthritis     lower back, knee  . Anemia     history  . GERD (gastroesophageal reflux disease)   . Anxiety   . Mental disorder   . Esophageal reflux   . Obesity   . Depression   . Migraines   . H/O gestational diabetes mellitus, not currently pregnant   . OSA (obstructive sleep apnea)     Mild w AHI 14/hr now on CPAP at 9cm H2O  . Hypertension     Current Outpatient Prescriptions  Medication Sig Dispense Refill  . albuterol (PROVENTIL HFA;VENTOLIN HFA) 108 (90 BASE) MCG/ACT inhaler Inhale 2 puffs into the lungs every 6 (six) hours as needed for wheezing or shortness of breath.    . ALPRAZolam (XANAX) 0.5 MG tablet Take 0.5 mg by mouth at bedtime as needed for sleep or anxiety.    Marland Kitchen. amLODipine (NORVASC) 5 MG tablet Take 5 mg by mouth daily.    . budesonide-formoterol (SYMBICORT) 160-4.5 MCG/ACT inhaler Inhale 2 puffs into the lungs 2 (two) times daily.    . cyclobenzaprine  (FLEXERIL) 10 MG tablet Take 10 mg by mouth as needed.    . DULoxetine (CYMBALTA) 60 MG capsule Take 60 mg by mouth daily.    Marland Kitchen. EPINEPHrine (EPIPEN 2-PAK) 0.3 mg/0.3 mL IJ SOAJ injection as needed.    Marland Kitchen. esomeprazole (NEXIUM) 40 MG capsule Take 40 mg by mouth daily before breakfast.    . estradiol (MINIVELLE) 0.1 MG/24HR patch daily.    Marland Kitchen. etodolac (LODINE) 400 MG tablet Take by mouth as needed.    . fluticasone (FLONASE) 50 MCG/ACT nasal spray Place 2 sprays into the nose daily.    Marland Kitchen. gabapentin (NEURONTIN) 300 MG capsule Take 300 mg by mouth 3 (three) times daily.    Marland Kitchen. lisinopril (PRINIVIL,ZESTRIL) 10 MG tablet Take 10 mg by mouth daily.    . meclizine (ANTIVERT) 25 MG tablet Take 25 mg by mouth 2 (two) times daily as needed for dizziness.    . meloxicam (MOBIC) 15 MG tablet Take 15 mg by mouth as needed.    . metFORMIN (GLUCOPHAGE-XR) 500 MG 24 hr tablet Take 500 mg by mouth.     . montelukast (SINGULAIR) 10 MG tablet Take 10 mg by mouth at bedtime.    .Marland Kitchen  oxyCODONE-acetaminophen (ROXICET) 5-325 MG per tablet Take 1 tablet by mouth every 4 (four) hours as needed for pain. 30 tablet 0  . SUMAtriptan (IMITREX) 100 MG tablet Take 100 mg by mouth every 2 (two) hours as needed for migraine.    . temazepam (RESTORIL) 15 MG capsule Take 15 mg by mouth at bedtime.    . topiramate (TOPAMAX) 25 MG tablet Take 75 mg by mouth at bedtime.    . traMADol (ULTRAM) 50 MG tablet Take 50 mg by mouth every 6 (six) hours as needed for pain.    . Vitamin D, Ergocalciferol, (DRISDOL) 50000 UNITS CAPS Take 50,000 Units by mouth 3 (three) times a week. Mondays, wednesdays, fridays     No current facility-administered medications for this visit.    Allergies:    Allergies  Allergen Reactions  . Iodine Anaphylaxis    "Seafood allergy"    Social History:  The patient  reports that she has never smoked. She has never used smokeless tobacco. She reports that she does not drink alcohol or use illicit drugs.   Family  History:  The patient's family history includes CAD in her maternal grandfather and maternal grandmother; Colon cancer in her maternal uncle and paternal uncle; Diabetes in her maternal grandmother; Diabetes Mellitus II in her mother.   ROS:  Please see the history of present illness.      All other systems reviewed and negative.   PHYSICAL EXAM: VS:  Ht 5\' 4"  (1.626 m)  Wt 298 lb 6.4 oz (135.353 kg)  BMI 51.19 kg/m2 Well nourished, well developed, in no acute distress HEENT: normal Neck: no JVD Cardiac:  normal S1, S2; RRR; no murmur Lungs:  clear to auscultation bilaterally, no wheezing, rhonchi or rales Abd: soft, nontender, no hepatomegaly Ext: no edema Skin: warm and dry Neuro:  CNs 2-12 intact, no focal abnormalities noted  ASSESSMENT AND PLAN:  1. OSA on CPAP and tolerating well -I will get a download from her DME 2. HTN - patient refused to have BP taken -continue amlodipine/Dyazide   3.       Obesity - encouraged to exercise  Followup with me in 6 months       Signed, Armanda Magicraci Turner, MD [facility] 03/27/2014 9:44 AM

## 2014-03-29 ENCOUNTER — Ambulatory Visit
Admission: RE | Admit: 2014-03-29 | Discharge: 2014-03-29 | Disposition: A | Discharge status: Home / Self Care | Payer: 59 | Source: Ambulatory Visit | Attending: Orthopedic Surgery | Admitting: Orthopedic Surgery

## 2014-03-29 ENCOUNTER — Telehealth: Payer: Self-pay

## 2014-03-29 ENCOUNTER — Other Ambulatory Visit: Payer: 59

## 2014-03-29 ENCOUNTER — Other Ambulatory Visit: Payer: Self-pay | Admitting: Family Medicine

## 2014-03-29 DIAGNOSIS — N644 Mastodynia: Secondary | ICD-10-CM

## 2014-04-06 ENCOUNTER — Other Ambulatory Visit: Payer: Self-pay

## 2014-04-09 ENCOUNTER — Ambulatory Visit: Admission: RE | Admit: 2014-04-09 | Payer: 59 | Source: Ambulatory Visit

## 2014-04-09 ENCOUNTER — Other Ambulatory Visit: Payer: Self-pay

## 2014-04-11 ENCOUNTER — Encounter (HOSPITAL_COMMUNITY): Payer: Self-pay | Admitting: Emergency Medicine

## 2014-04-11 ENCOUNTER — Emergency Department (HOSPITAL_COMMUNITY)
Admission: EM | Admit: 2014-04-11 | Discharge: 2014-04-12 | Disposition: A | Discharge status: Home / Self Care | Payer: 59 | Attending: Emergency Medicine | Admitting: Emergency Medicine

## 2014-04-11 DIAGNOSIS — Z79899 Other long term (current) drug therapy: Secondary | ICD-10-CM | POA: Diagnosis not present

## 2014-04-11 DIAGNOSIS — G43909 Migraine, unspecified, not intractable, without status migrainosus: Secondary | ICD-10-CM | POA: Insufficient documentation

## 2014-04-11 DIAGNOSIS — Z791 Long term (current) use of non-steroidal anti-inflammatories (NSAID): Secondary | ICD-10-CM | POA: Diagnosis not present

## 2014-04-11 DIAGNOSIS — F419 Anxiety disorder, unspecified: Secondary | ICD-10-CM | POA: Insufficient documentation

## 2014-04-11 DIAGNOSIS — Z7951 Long term (current) use of inhaled steroids: Secondary | ICD-10-CM | POA: Insufficient documentation

## 2014-04-11 DIAGNOSIS — Z8632 Personal history of gestational diabetes: Secondary | ICD-10-CM | POA: Insufficient documentation

## 2014-04-11 DIAGNOSIS — E669 Obesity, unspecified: Secondary | ICD-10-CM | POA: Diagnosis not present

## 2014-04-11 DIAGNOSIS — Z862 Personal history of diseases of the blood and blood-forming organs and certain disorders involving the immune mechanism: Secondary | ICD-10-CM | POA: Insufficient documentation

## 2014-04-11 DIAGNOSIS — Z9981 Dependence on supplemental oxygen: Secondary | ICD-10-CM | POA: Diagnosis not present

## 2014-04-11 DIAGNOSIS — J45909 Unspecified asthma, uncomplicated: Secondary | ICD-10-CM

## 2014-04-11 DIAGNOSIS — J45901 Unspecified asthma with (acute) exacerbation: Secondary | ICD-10-CM | POA: Insufficient documentation

## 2014-04-11 DIAGNOSIS — J209 Acute bronchitis, unspecified: Secondary | ICD-10-CM | POA: Insufficient documentation

## 2014-04-11 DIAGNOSIS — I10 Essential (primary) hypertension: Secondary | ICD-10-CM | POA: Insufficient documentation

## 2014-04-11 DIAGNOSIS — F329 Major depressive disorder, single episode, unspecified: Secondary | ICD-10-CM | POA: Insufficient documentation

## 2014-04-11 DIAGNOSIS — K219 Gastro-esophageal reflux disease without esophagitis: Secondary | ICD-10-CM | POA: Diagnosis not present

## 2014-04-11 DIAGNOSIS — M1990 Unspecified osteoarthritis, unspecified site: Secondary | ICD-10-CM | POA: Diagnosis not present

## 2014-04-11 NOTE — ED Notes (Signed)
Pt states she has been sick since Monday  Pt went to her PCP today and was given a treatment for her asthma and was told to take another at 6pm and if she was not feeling any better to come to the hospital  Pt states she has asthma and has been wheezing and having tightness in her chest consistent with asthma

## 2014-04-12 ENCOUNTER — Emergency Department (HOSPITAL_COMMUNITY): Payer: 59

## 2014-04-12 MED ORDER — PREDNISONE 20 MG PO TABS: ORAL_TABLET | ORAL | Status: DC

## 2014-04-12 MED ORDER — GUAIFENESIN ER 600 MG PO TB12
600.0000 mg | ORAL_TABLET | Freq: Once | ORAL | Status: AC
  Administered 2014-04-12: 600 mg via ORAL
  Filled 2014-04-12: qty 1

## 2014-04-12 MED ORDER — IPRATROPIUM-ALBUTEROL 0.5-2.5 (3) MG/3ML IN SOLN
3.0000 mL | Freq: Once | RESPIRATORY_TRACT | Status: AC
  Administered 2014-04-12: 3 mL via RESPIRATORY_TRACT
  Filled 2014-04-12: qty 3

## 2014-04-12 MED ORDER — GUAIFENESIN ER 600 MG PO TB12: 600.0000 mg | ORAL_TABLET | Freq: Two times a day (BID) | ORAL | Status: DC

## 2014-04-12 MED ORDER — METHYLPREDNISOLONE SODIUM SUCC 125 MG IJ SOLR
125.0000 mg | Freq: Once | INTRAMUSCULAR | Status: AC
Start: 2014-04-12 — End: 2014-04-12
  Administered 2014-04-12: 125 mg via INTRAVENOUS
  Filled 2014-04-12: qty 2

## 2014-04-12 MED ORDER — IPRATROPIUM BROMIDE 0.02 % IN SOLN: 0.5000 mg | Freq: Four times a day (QID) | RESPIRATORY_TRACT | Status: AC

## 2014-04-12 MED ORDER — AZITHROMYCIN 250 MG PO TABS: ORAL_TABLET | ORAL | Status: DC

## 2014-04-12 MED ORDER — HYDROCODONE-HOMATROPINE 5-1.5 MG/5ML PO SYRP
5.0000 mL | ORAL_SOLUTION | Freq: Once | ORAL | Status: AC
  Administered 2014-04-12: 5 mL via ORAL
  Filled 2014-04-12: qty 5

## 2014-04-12 MED ORDER — IPRATROPIUM-ALBUTEROL 0.5-2.5 (3) MG/3ML IN SOLN
3.0000 mL | Freq: Once | RESPIRATORY_TRACT | Status: AC
Start: 2014-04-12 — End: 2014-04-12
  Administered 2014-04-12: 3 mL via RESPIRATORY_TRACT
  Filled 2014-04-12: qty 3

## 2014-04-12 NOTE — Discharge Instructions (Signed)

## 2014-04-12 NOTE — ED Provider Notes (Signed)
CSN: [id number]     Arrival date & time 04/11/14  2100 History   First MD Initiated Contact with Patient 04/12/14 0014     Chief Complaint  Patient presents with  . Asthma     (Consider location/radiation/quality/duration/timing/severity/associated sxs/prior Treatment) HPI Patient reports a cough for 3 days. She reports that her chest is been tight and she is been wheezing. She reports that she got started on a albuterol nebulizer. She reports that she got a treatment today at her doctor's office and has a machine at home. She reports she took a treatment at home tonight but was not getting any improvement. She's had no fevers or chills. She denies sputum with cough. No vomiting or diarrhea. No lower extremity swelling or pain. The patient reports she got started on a Medrol Dosepak 2 days ago but has not had any improvement. Past Medical History  Diagnosis Date  . SVD (spontaneous vaginal delivery)     x 2  . PONV (postoperative nausea and vomiting)   . Asthma   . Hyperlipidemia     diet controlled  . Seasonal allergies   . Sleep apnea     cpap setting 70  . Headache(784.0)     on rx med  . Arthritis     lower back, knee  . Anemia     history  . GERD (gastroesophageal reflux disease)   . Anxiety   . Mental disorder   . Esophageal reflux   . Obesity   . Depression   . Migraines   . H/O gestational diabetes mellitus, not currently pregnant   . OSA (obstructive sleep apnea)     Mild w AHI 14/hr now on CPAP at 9cm H2O  . Hypertension    Past Surgical History  Procedure Laterality Date  . Abdominal hysterectomy    . Carpel tunnel surgery      x 2 - left and right  . Trigger thumb release      x 2 for right and left thumbs  . Wisdom tooth extraction    . Left foot surgery      remove cyst  . Cesarean section  1987    x 1  . Robotic assisted salpingo oopherectomy Left 08/08/2012    Procedure: ROBOTIC ASSISTED SALPINGO OOPHORECTOMY;  Surgeon: Serita Kyle, MD;   Location: [facility];  Service: Gynecology;  Laterality: Left;  . Robotic assisted laparoscopic lysis of adhesion N/A 08/08/2012    Procedure: ROBOTIC ASSISTED LAPAROSCOPIC exctensive LYSIS OF ADHESION;  Surgeon: Serita Kyle, MD;  Location: [facility];  Service: Gynecology;  Laterality: N/A;  . Cystoscopy N/A 08/08/2012    Procedure: CYSTOSCOPY;  Surgeon: Serita Kyle, MD;  Location: [facility];  Service: Gynecology;  Laterality: N/A;   Family History  Problem Relation Age of Onset  . Diabetes Mellitus II Mother   . Colon cancer Maternal Uncle   . Colon cancer Paternal Uncle   . Diabetes Maternal Grandmother   . CAD Maternal Grandmother   . CAD Maternal Grandfather    History  Substance Use Topics  . Smoking status: Never Smoker   . Smokeless tobacco: Never Used  . Alcohol Use: No   OB History    No data available     Review of Systems 10 Systems reviewed and are negative for acute change except as noted in the HPI.    Allergies  Iodine  Home Medications   Prior to Admission medications   Medication Sig Start Date  End Date Taking? Authorizing Provider  albuterol (PROVENTIL HFA;VENTOLIN HFA) 108 (90 BASE) MCG/ACT inhaler Inhale 2 puffs into the lungs every 6 (six) hours as needed for wheezing or shortness of breath.   Yes Historical Provider, MD  ALPRAZolam Prudy Feeler(XANAX) 0.5 MG tablet Take 0.5 mg by mouth at bedtime as needed for sleep or anxiety.   Yes Historical Provider, MD  amLODipine (NORVASC) 5 MG tablet Take 5 mg by mouth daily.   Yes Historical Provider, MD  budesonide-formoterol (SYMBICORT) 160-4.5 MCG/ACT inhaler Inhale 2 puffs into the lungs 2 (two) times daily.   Yes Historical Provider, MD  cyclobenzaprine (FLEXERIL) 10 MG tablet Take 10 mg by mouth as needed for muscle spasms.    Yes Historical Provider, MD  DULoxetine (CYMBALTA) 60 MG capsule Take 60 mg by mouth daily.   Yes Historical Provider, MD  EPINEPHrine (EPIPEN 2-PAK) 0.3 mg/0.3 mL IJ SOAJ injection as  needed. 10/25/12  Yes Historical Provider, MD  esomeprazole (NEXIUM) 40 MG capsule Take 40 mg by mouth daily before breakfast.   Yes Historical Provider, MD  estradiol (MINIVELLE) 0.1 MG/24HR patch Place 1 patch onto the skin daily.  11/29/12  Yes Historical Provider, MD  etodolac (LODINE) 400 MG tablet Take 400 mg by mouth as needed for moderate pain.    Yes Historical Provider, MD  fluticasone (FLONASE) 50 MCG/ACT nasal spray Place 2 sprays into the nose daily.   Yes Historical Provider, MD  gabapentin (NEURONTIN) 300 MG capsule Take 300 mg by mouth 3 (three) times daily.   Yes Historical Provider, MD  lisinopril (PRINIVIL,ZESTRIL) 10 MG tablet Take 10 mg by mouth daily.   Yes Historical Provider, MD  meclizine (ANTIVERT) 25 MG tablet Take 25 mg by mouth 2 (two) times daily as needed for dizziness.   Yes Historical Provider, MD  meloxicam (MOBIC) 15 MG tablet Take 15 mg by mouth as needed.   Yes Historical Provider, MD  metFORMIN (GLUCOPHAGE-XR) 500 MG 24 hr tablet Take 500 mg by mouth.  05/26/13  Yes Historical Provider, MD  methotrexate (RHEUMATREX) 2.5 MG tablet Take 10 mg by mouth once a week. On mondays. Caution:Chemotherapy. Protect from light.   Yes Historical Provider, MD  montelukast (SINGULAIR) 10 MG tablet Take 10 mg by mouth at bedtime.   Yes Historical Provider, MD  oxyCODONE-acetaminophen (ROXICET) 5-325 MG per tablet Take 1 tablet by mouth every 4 (four) hours as needed for pain. 08/08/12  Yes Sheronette A Cousins, MD  promethazine-codeine (PHENERGAN WITH CODEINE) 6.25-10 MG/5ML syrup Take 5 mLs by mouth every 6 (six) hours as needed for cough.   Yes Historical Provider, MD  SUMAtriptan (IMITREX) 100 MG tablet Take 100 mg by mouth every 2 (two) hours as needed for migraine.   Yes Historical Provider, MD  temazepam (RESTORIL) 15 MG capsule Take 15 mg by mouth at bedtime.   Yes Historical Provider, MD  topiramate (TOPAMAX) 25 MG tablet Take 75 mg by mouth at bedtime.   Yes Historical  Provider, MD  traMADol (ULTRAM) 50 MG tablet Take 50 mg by mouth every 6 (six) hours as needed for pain.   Yes Historical Provider, MD  Vitamin D, Ergocalciferol, (DRISDOL) 50000 UNITS CAPS Take 50,000 Units by mouth 3 (three) times a week. Mondays, wednesdays, fridays   Yes Historical Provider, MD  azithromycin (ZITHROMAX Z-PAK) 250 MG tablet 2 po day one, then 1 daily x 4 days 04/12/14   Arby BarretteMarcy Naveh Rickles, MD  guaiFENesin (MUCINEX) 600 MG 12 hr tablet Take 1 tablet (600  mg total) by mouth 2 (two) times daily. 04/12/14   Arby Barrette, MD  ipratropium (ATROVENT) 0.02 % nebulizer solution Take 2.5 mLs (0.5 mg total) by nebulization 4 (four) times daily. 04/12/14   Arby Barrette, MD  predniSONE (DELTASONE) 20 MG tablet 3 tabs po daily x 3 days, then 2 tabs x 3 days, then 1.5 tabs x 3 days, then 1 tab x 3 days, then 0.5 tabs x 3 days 04/12/14   Arby Barrette, MD   BP 142/76 mmHg  Pulse 51  Temp(Src) 98.2 F (36.8 C) (Oral)  Resp 20  Ht  (1.626 m)  Wt 280 lb (127.007 kg)  BMI 48.04 kg/m2  SpO2 100% Physical Exam  Constitutional: She is oriented to person, place, and time.  The patient is morbidly obese. She has an intermittent dry cough. She has no restaurant distress at rest.  HENT:  Head: Normocephalic and atraumatic.  Nose: Nose normal.  Mouth/Throat: No oropharyngeal exudate.  Eyes: EOM are normal. Pupils are equal, round, and reactive to light.  Neck: Neck supple.  Cardiovascular: Normal rate, regular rhythm, normal heart sounds and intact distal pulses.   Pulmonary/Chest: Effort normal. No respiratory distress. She has wheezes (patient diffuse wheezes bilaterally.). She has no rales. She exhibits no tenderness.  Abdominal: Soft. Bowel sounds are normal. She exhibits no distension. There is no tenderness.  Musculoskeletal: Normal range of motion. She exhibits no edema or tenderness.  Neurological: She is alert and oriented to person, place, and time. She has normal strength.  Coordination normal. GCS eye subscore is 4. GCS verbal subscore is 5. GCS motor subscore is 6.  Skin: Skin is warm, dry and intact.  Psychiatric: She has a normal mood and affect.    ED Course  Procedures (including critical care time) Labs Review Labs Reviewed - No data to display  Imaging Review Dg Chest 2 View  04/12/2014   CLINICAL DATA:  Acute onset of wheezing and chest tightness. Initial encounter.  EXAM: CHEST  2 VIEW  COMPARISON:  Chest radiograph performed 11/21/2013  FINDINGS: The lungs are well-aerated and clear. There is no evidence of focal opacification, pleural effusion or pneumothorax.  The heart is normal in size; the mediastinal contour is within normal limits. No acute osseous abnormalities are seen.  IMPRESSION: No acute cardiopulmonary process seen.   Electronically Signed   By: Roanna Raider M.D.   On: 04/12/2014 01:29     EKG Interpretation None     04:34 patient has been treated with 3 DuoNeb's and Solu-Medrol. She has improved and ambulated without difficulty with O2 sat at 95%. Patient has been re-auscultated several times. There is improved air flow to the bases. MDM   Final diagnoses:  Bronchitis with asthma, acute   At this point the patient appears to have bronchitis and asthma exacerbation. She has not noticed improvement with treatment to date and has continued wheezing and dyspnea. Patient was treated with 3 DuoNeb's and Solu-Medrol IV. After treatments she was ambulated with out significant increase in effort and no desaturation below 95%. At this point feel patient can continue outpatient management. She is counseled for close follow-up and return to emergency department for CT or changing symptoms.    Arby Barrette, MD 04/12/14 [phone]

## 2014-04-12 NOTE — ED Notes (Signed)
Pt 95% while ambulating.

## 2014-06-15 ENCOUNTER — Encounter: Payer: Self-pay | Admitting: Cardiology

## 2014-07-04 ENCOUNTER — Other Ambulatory Visit: Payer: Self-pay | Admitting: Family Medicine

## 2014-07-04 DIAGNOSIS — N6310 Unspecified lump in the right breast, unspecified quadrant: Secondary | ICD-10-CM

## 2014-07-04 DIAGNOSIS — N63 Unspecified lump in unspecified breast: Secondary | ICD-10-CM

## 2014-07-31 ENCOUNTER — Encounter (HOSPITAL_COMMUNITY): Payer: Self-pay

## 2014-07-31 ENCOUNTER — Emergency Department (HOSPITAL_COMMUNITY)
Admission: EM | Admit: 2014-07-31 | Discharge: 2014-07-31 | Disposition: A | Discharge status: Home / Self Care | Payer: 59 | Attending: Emergency Medicine | Admitting: Emergency Medicine

## 2014-07-31 DIAGNOSIS — E669 Obesity, unspecified: Secondary | ICD-10-CM | POA: Insufficient documentation

## 2014-07-31 DIAGNOSIS — F419 Anxiety disorder, unspecified: Secondary | ICD-10-CM | POA: Insufficient documentation

## 2014-07-31 DIAGNOSIS — K219 Gastro-esophageal reflux disease without esophagitis: Secondary | ICD-10-CM | POA: Diagnosis not present

## 2014-07-31 DIAGNOSIS — Z792 Long term (current) use of antibiotics: Secondary | ICD-10-CM | POA: Insufficient documentation

## 2014-07-31 DIAGNOSIS — G4733 Obstructive sleep apnea (adult) (pediatric): Secondary | ICD-10-CM | POA: Diagnosis not present

## 2014-07-31 DIAGNOSIS — I10 Essential (primary) hypertension: Secondary | ICD-10-CM | POA: Insufficient documentation

## 2014-07-31 DIAGNOSIS — G43909 Migraine, unspecified, not intractable, without status migrainosus: Secondary | ICD-10-CM | POA: Diagnosis not present

## 2014-07-31 DIAGNOSIS — Z79899 Other long term (current) drug therapy: Secondary | ICD-10-CM | POA: Insufficient documentation

## 2014-07-31 DIAGNOSIS — F329 Major depressive disorder, single episode, unspecified: Secondary | ICD-10-CM | POA: Diagnosis not present

## 2014-07-31 DIAGNOSIS — D649 Anemia, unspecified: Secondary | ICD-10-CM | POA: Insufficient documentation

## 2014-07-31 DIAGNOSIS — Z7952 Long term (current) use of systemic steroids: Secondary | ICD-10-CM | POA: Diagnosis not present

## 2014-07-31 DIAGNOSIS — Z9981 Dependence on supplemental oxygen: Secondary | ICD-10-CM | POA: Diagnosis not present

## 2014-07-31 DIAGNOSIS — M1990 Unspecified osteoarthritis, unspecified site: Secondary | ICD-10-CM | POA: Insufficient documentation

## 2014-07-31 DIAGNOSIS — J45909 Unspecified asthma, uncomplicated: Secondary | ICD-10-CM | POA: Insufficient documentation

## 2014-07-31 DIAGNOSIS — G4730 Sleep apnea, unspecified: Secondary | ICD-10-CM | POA: Diagnosis not present

## 2014-07-31 DIAGNOSIS — R739 Hyperglycemia, unspecified: Secondary | ICD-10-CM

## 2014-07-31 DIAGNOSIS — E1165 Type 2 diabetes mellitus with hyperglycemia: Secondary | ICD-10-CM | POA: Insufficient documentation

## 2014-07-31 HISTORY — DX: Type 2 diabetes mellitus without complications: E11.9

## 2014-07-31 LAB — CBC WITH DIFFERENTIAL/PLATELET
Basophils Absolute: 0 10*3/uL (ref 0.0–0.1)
Basophils Relative: 1 % (ref 0–1)
Eosinophils Absolute: 0.1 10*3/uL (ref 0.0–0.7)
Eosinophils Relative: 2 % (ref 0–5)
HEMATOCRIT: 38 % (ref 36.0–46.0)
Hemoglobin: 12.4 g/dL (ref 12.0–15.0)
LYMPHS PCT: 43 % (ref 12–46)
Lymphs Abs: 2.1 10*3/uL (ref 0.7–4.0)
MCH: 30.6 pg (ref 26.0–34.0)
MCHC: 32.6 g/dL (ref 30.0–36.0)
MCV: 93.8 fL (ref 78.0–100.0)
Monocytes Absolute: 0.4 10*3/uL (ref 0.1–1.0)
Monocytes Relative: 9 % (ref 3–12)
NEUTROS PCT: 45 % (ref 43–77)
Neutro Abs: 2.3 10*3/uL (ref 1.7–7.7)
Platelets: 189 10*3/uL (ref 150–400)
RBC: 4.05 MIL/uL (ref 3.87–5.11)
RDW: 12.6 % (ref 11.5–15.5)
WBC: 5 10*3/uL (ref 4.0–10.5)

## 2014-07-31 LAB — BASIC METABOLIC PANEL
Anion gap: 4 — ABNORMAL LOW (ref 5–15)
BUN: 9 mg/dL (ref 6–20)
CHLORIDE: 103 mmol/L (ref 101–111)
CO2: 28 mmol/L (ref 22–32)
Calcium: 8.9 mg/dL (ref 8.9–10.3)
Creatinine, Ser: 0.83 mg/dL (ref 0.44–1.00)
GFR calc Af Amer: >60 mL/min (ref >60)
GFR calc non Af Amer: >60 mL/min (ref >60)
Glucose, Bld: 330 mg/dL — ABNORMAL HIGH (ref 70–99)
POTASSIUM: 4 mmol/L (ref 3.5–5.1)
SODIUM: 135 mmol/L (ref 135–145)

## 2014-07-31 LAB — CBG MONITORING, ED
Glucose-Capillary: 331 mg/dL — ABNORMAL HIGH (ref 70–99)
Glucose-Capillary: 325 mg/dL — ABNORMAL HIGH (ref 70–99)
Glucose-Capillary: 303 mg/dL — ABNORMAL HIGH (ref 70–99)

## 2014-07-31 MED ORDER — METFORMIN HCL 500 MG PO TABS
500.0000 mg | ORAL_TABLET | Freq: Once | ORAL | Status: DC
  Filled 2014-07-31: qty 1

## 2014-07-31 NOTE — Discharge Instructions (Signed)

## 2014-07-31 NOTE — ED Notes (Signed)
Patient reports she felt dizzy at home and checked her blood sugar.  It was 303 initially, she then drank some cinammon water, then it was 283.  Currently it is 331 in ED.

## 2014-07-31 NOTE — ED Provider Notes (Signed)
Medical screening examination/treatment/procedure(s) were performed by non-physician practitioner and as supervising physician I was immediately available for consultation/collaboration.   EKG Interpretation None       Caria Transue, MD 07/31/14 2306

## 2014-07-31 NOTE — ED Provider Notes (Signed)
CSN: [id number]     Arrival date & time 07/31/14  0129 History   First MD Initiated Contact with Patient 07/31/14 0344     Chief Complaint  Patient presents with  . Hyperglycemia     (Consider location/radiation/quality/duration/timing/severity/associated sxs/prior Treatment) HPI Comments: Patient is a non-insulin-dependent orbital area based diabetic, who states that last night she felt slightly dizzy contact her blood sugar was over 300.  She also states that she had a place upon stop and a smoothie and dinner at [employer] yesterday.  She states that she has had nutritional counseling and try to keep her diet under control.  She denies any polyuria, polydipsia, URI symptoms, nausea, vomiting, diarrhea, URI symptoms or any other sign of infection  Patient is a 49 y.o. female presenting with hyperglycemia. The history is provided by the patient.  Hyperglycemia Severity:  Unable to specify Onset quality:  Unable to specify Timing:  Intermittent Progression:  Worsening Diabetes status:  Controlled with oral medications Current diabetic therapy:  Informant 500 mg at bedtime Context: noncompliance   Relieved by:  Nothing Ineffective treatments:  None tried Associated symptoms: dizziness   Associated symptoms: no abdominal pain, no chest pain, no confusion, no dehydration, no dysuria, no fever, no increased thirst, no nausea, no polyuria, no shortness of breath, no vomiting and no weakness     Past Medical History  Diagnosis Date  . SVD (spontaneous vaginal delivery)     x 2  . PONV (postoperative nausea and vomiting)   . Asthma   . Hyperlipidemia     diet controlled  . Seasonal allergies   . Sleep apnea     cpap setting 70  . Headache(784.0)     on rx med  . Arthritis     lower back, knee  . Anemia     history  . GERD (gastroesophageal reflux disease)   . Anxiety   . Mental disorder   . Esophageal reflux   . Obesity   . Depression   . Migraines   . H/O gestational  diabetes mellitus, not currently pregnant   . OSA (obstructive sleep apnea)     Mild w AHI 14/hr now on CPAP at 9cm H2O  . Hypertension   . Diabetes mellitus without complication    Past Surgical History  Procedure Laterality Date  . Abdominal hysterectomy    . Carpel tunnel surgery      x 2 - left and right  . Trigger thumb release      x 2 for right and left thumbs  . Wisdom tooth extraction    . Left foot surgery      remove cyst  . Cesarean section  1987    x 1  . Robotic assisted salpingo oopherectomy Left 08/08/2012    Procedure: ROBOTIC ASSISTED SALPINGO OOPHORECTOMY;  Surgeon: Serita KyleSheronette A Cousins, MD;  Location: [facility];  Service: Gynecology;  Laterality: Left;  . Robotic assisted laparoscopic lysis of adhesion N/A 08/08/2012    Procedure: ROBOTIC ASSISTED LAPAROSCOPIC exctensive LYSIS OF ADHESION;  Surgeon: Serita KyleSheronette A Cousins, MD;  Location: [facility];  Service: Gynecology;  Laterality: N/A;  . Cystoscopy N/A 08/08/2012    Procedure: CYSTOSCOPY;  Surgeon: Serita KyleSheronette A Cousins, MD;  Location: [facility];  Service: Gynecology;  Laterality: N/A;   Family History  Problem Relation Age of Onset  . Diabetes Mellitus II Mother   . Colon cancer Maternal Uncle   . Colon cancer Paternal Uncle   . Diabetes Maternal Grandmother   .  CAD Maternal Grandmother   . CAD Maternal Grandfather    History  Substance Use Topics  . Smoking status: Never Smoker   . Smokeless tobacco: Never Used  . Alcohol Use: No   OB History    No data available     Review of Systems  Constitutional: Negative for fever.  HENT: Negative for rhinorrhea.   Respiratory: Negative for cough and shortness of breath.   Cardiovascular: Negative for chest pain.  Gastrointestinal: Negative for nausea, vomiting, abdominal pain, diarrhea and constipation.  Endocrine: Negative for polydipsia, polyphagia and polyuria.  Genitourinary: Negative for dysuria.  Musculoskeletal: Negative for myalgias.  Neurological:  Positive for dizziness. Negative for weakness.  Psychiatric/Behavioral: Negative for confusion.  All other systems reviewed and are negative.     Allergies  Iodine  Home Medications   Prior to Admission medications   Medication Sig Start Date End Date Taking? Authorizing Provider  albuterol (PROVENTIL HFA;VENTOLIN HFA) 108 (90 BASE) MCG/ACT inhaler Inhale 2 puffs into the lungs every 6 (six) hours as needed for wheezing or shortness of breath.   Yes Historical Provider, MD  ALPRAZolam Prudy Feeler) 0.5 MG tablet Take 0.5 mg by mouth at bedtime as needed for sleep or anxiety.   Yes Historical Provider, MD  amLODipine (NORVASC) 5 MG tablet Take 5 mg by mouth daily.   Yes Historical Provider, MD  budesonide-formoterol (SYMBICORT) 160-4.5 MCG/ACT inhaler Inhale 2 puffs into the lungs 2 (two) times daily.   Yes Historical Provider, MD  DULoxetine (CYMBALTA) 60 MG capsule Take 60 mg by mouth daily.   Yes Historical Provider, MD  EPINEPHrine (EPIPEN 2-PAK) 0.3 mg/0.3 mL IJ SOAJ injection as needed. 10/25/12  Yes Historical Provider, MD  esomeprazole (NEXIUM) 40 MG capsule Take 40 mg by mouth daily before breakfast.   Yes Historical Provider, MD  estradiol (MINIVELLE) 0.1 MG/24HR patch Place 1 patch onto the skin daily.  11/29/12  Yes Historical Provider, MD  etodolac (LODINE) 400 MG tablet Take 400 mg by mouth as needed for moderate pain.    Yes Historical Provider, MD  fluticasone (FLONASE) 50 MCG/ACT nasal spray Place 2 sprays into the nose daily.   Yes Historical Provider, MD  folic acid (FOLVITE) 1 MG tablet Take 1 mg by mouth daily.  07/24/14  Yes Historical Provider, MD  gabapentin (NEURONTIN) 300 MG capsule Take 300 mg by mouth 3 (three) times daily.   Yes Historical Provider, MD  levocetirizine (XYZAL) 5 MG tablet Take 5 mg by mouth every evening. 06/12/14  Yes Historical Provider, MD  lisinopril (PRINIVIL,ZESTRIL) 10 MG tablet Take 10 mg by mouth daily.   Yes Historical Provider, MD  losartan  (COZAAR) 50 MG tablet  07/24/14  Yes Historical Provider, MD  meclizine (ANTIVERT) 25 MG tablet Take 25 mg by mouth 2 (two) times daily as needed for dizziness.   Yes Historical Provider, MD  meloxicam (MOBIC) 15 MG tablet Take 15 mg by mouth as needed.   Yes Historical Provider, MD  metFORMIN (GLUCOPHAGE-XR) 500 MG 24 hr tablet Take 500 mg by mouth.  05/26/13  Yes Historical Provider, MD  methotrexate (RHEUMATREX) 2.5 MG tablet Take 10 mg by mouth once a week. On Wednesday. Caution:Chemotherapy. Protect from light.   Yes Historical Provider, MD  promethazine-codeine (PHENERGAN WITH CODEINE) 6.25-10 MG/5ML syrup Take 5 mLs by mouth every 6 (six) hours as needed for cough.   Yes Historical Provider, MD  SUMAtriptan (IMITREX) 100 MG tablet Take 100 mg by mouth every 2 (two) hours as  needed for migraine.   Yes Historical Provider, MD  temazepam (RESTORIL) 15 MG capsule Take 15 mg by mouth at bedtime.   Yes Historical Provider, MD  topiramate (TOPAMAX) 25 MG tablet Take 75 mg by mouth at bedtime.   Yes Historical Provider, MD  Vitamin D, Ergocalciferol, (DRISDOL) 50000 UNITS CAPS Take 50,000 Units by mouth 3 (three) times a week. Mondays, wednesdays, fridays   Yes Historical Provider, MD  azithromycin (ZITHROMAX Z-PAK) 250 MG tablet 2 po day one, then 1 daily x 4 days 04/12/14   Arby Barrette, MD  guaiFENesin (MUCINEX) 600 MG 12 hr tablet Take 1 tablet (600 mg total) by mouth 2 (two) times daily. 04/12/14   Arby Barrette, MD  ipratropium (ATROVENT) 0.02 % nebulizer solution Take 2.5 mLs (0.5 mg total) by nebulization 4 (four) times daily. Patient taking differently: Take 0.5 mg by nebulization every 6 (six) hours as needed for wheezing or shortness of breath.  04/12/14   Arby Barrette, MD  oxyCODONE-acetaminophen (ROXICET) 5-325 MG per tablet Take 1 tablet by mouth every 4 (four) hours as needed for pain. 08/08/12   Maxie Better, MD  predniSONE (DELTASONE) 20 MG tablet 3 tabs po daily x 3 days, then 2  tabs x 3 days, then 1.5 tabs x 3 days, then 1 tab x 3 days, then 0.5 tabs x 3 days 04/12/14   Arby Barrette, MD   BP 132/80 mmHg  Pulse 53  Temp(Src) 98.1 F (36.7 C) (Oral)  Resp 18  SpO2 98% Physical Exam  Constitutional: She is oriented to person, place, and time. She appears well-developed and well-nourished.  HENT:  Head: Normocephalic.  Eyes: Pupils are equal, round, and reactive to light.  Neck: Normal range of motion.  Cardiovascular: Normal rate.   Pulmonary/Chest: Effort normal.  Abdominal: Soft. She exhibits no distension.  Abdominal exam difficult due to body habitus  Musculoskeletal: Normal range of motion.  Neurological: She is alert and oriented to person, place, and time.  Skin: Skin is warm. No erythema.  Nursing note and vitals reviewed.   ED Course  Procedures (including critical care time) Labs Review Labs Reviewed  BASIC METABOLIC PANEL - Abnormal; Notable for the following:    Glucose, Bld 330 (*)    Anion gap 4 (*)    All other components within normal limits  CBG MONITORING, ED - Abnormal; Notable for the following:    Glucose-Capillary 331 (*)    All other components within normal limits  CBG MONITORING, ED - Abnormal; Notable for the following:    Glucose-Capillary 325 (*)    All other components within normal limits  CBG MONITORING, ED - Abnormal; Notable for the following:    Glucose-Capillary 303 (*)    All other components within normal limits  CBC WITH DIFFERENTIAL/PLATELET  CBG MONITORING, ED    Imaging Review No results found.   EKG Interpretation None     Since labs reviewed .  Blood sugar is 3:30, anion gap is 4.  Think she is safe to go him after receiving 1 L of normal saline and an additional 500 mg of metformin.  She has been counseled on better dietary decisions MDM   Final diagnoses:  Hyperglycemia         Earley Favor, NP 08/02/14 2318  April Palumbo, MD 08/04/14 2303

## 2014-07-31 NOTE — ED Notes (Signed)
NS bolus started.  

## 2014-09-07 ENCOUNTER — Emergency Department (HOSPITAL_COMMUNITY)
Admission: EM | Admit: 2014-09-07 | Discharge: 2014-09-07 | Disposition: A | Discharge status: Home / Self Care | Payer: 59 | Attending: Emergency Medicine | Admitting: Emergency Medicine

## 2014-09-07 ENCOUNTER — Encounter (HOSPITAL_COMMUNITY): Payer: Self-pay | Admitting: Emergency Medicine

## 2014-09-07 DIAGNOSIS — F419 Anxiety disorder, unspecified: Secondary | ICD-10-CM | POA: Diagnosis not present

## 2014-09-07 DIAGNOSIS — Z79899 Other long term (current) drug therapy: Secondary | ICD-10-CM | POA: Insufficient documentation

## 2014-09-07 DIAGNOSIS — R739 Hyperglycemia, unspecified: Secondary | ICD-10-CM

## 2014-09-07 DIAGNOSIS — F329 Major depressive disorder, single episode, unspecified: Secondary | ICD-10-CM | POA: Insufficient documentation

## 2014-09-07 DIAGNOSIS — K219 Gastro-esophageal reflux disease without esophagitis: Secondary | ICD-10-CM | POA: Diagnosis not present

## 2014-09-07 DIAGNOSIS — Z7951 Long term (current) use of inhaled steroids: Secondary | ICD-10-CM | POA: Insufficient documentation

## 2014-09-07 DIAGNOSIS — J45909 Unspecified asthma, uncomplicated: Secondary | ICD-10-CM | POA: Diagnosis not present

## 2014-09-07 DIAGNOSIS — M1990 Unspecified osteoarthritis, unspecified site: Secondary | ICD-10-CM | POA: Diagnosis not present

## 2014-09-07 DIAGNOSIS — G43909 Migraine, unspecified, not intractable, without status migrainosus: Secondary | ICD-10-CM | POA: Diagnosis not present

## 2014-09-07 DIAGNOSIS — E1165 Type 2 diabetes mellitus with hyperglycemia: Secondary | ICD-10-CM | POA: Diagnosis present

## 2014-09-07 DIAGNOSIS — E669 Obesity, unspecified: Secondary | ICD-10-CM | POA: Insufficient documentation

## 2014-09-07 DIAGNOSIS — I10 Essential (primary) hypertension: Secondary | ICD-10-CM | POA: Insufficient documentation

## 2014-09-07 DIAGNOSIS — Z9981 Dependence on supplemental oxygen: Secondary | ICD-10-CM | POA: Diagnosis not present

## 2014-09-07 LAB — CBG MONITORING, ED
GLUCOSE-CAPILLARY: 363 mg/dL — AB (ref 65–99)
GLUCOSE-CAPILLARY: 309 mg/dL — AB (ref 65–99)
Glucose-Capillary: 488 mg/dL — ABNORMAL HIGH (ref 65–99)
Glucose-Capillary: 418 mg/dL — ABNORMAL HIGH (ref 65–99)
Glucose-Capillary: 385 mg/dL — ABNORMAL HIGH (ref 65–99)
Glucose-Capillary: 323 mg/dL — ABNORMAL HIGH (ref 65–99)

## 2014-09-07 LAB — CBC WITH DIFFERENTIAL/PLATELET
Basophils Absolute: 0 10*3/uL (ref 0.0–0.1)
Basophils Relative: 0 % (ref 0–1)
EOS ABS: 0.1 10*3/uL (ref 0.0–0.7)
Eosinophils Relative: 1 % (ref 0–5)
HCT: 38.2 % (ref 36.0–46.0)
Hemoglobin: 12.6 g/dL (ref 12.0–15.0)
LYMPHS ABS: 2.2 10*3/uL (ref 0.7–4.0)
LYMPHS PCT: 49 % — AB (ref 12–46)
MCH: 30.1 pg (ref 26.0–34.0)
MCHC: 33 g/dL (ref 30.0–36.0)
MCV: 91.4 fL (ref 78.0–100.0)
Monocytes Absolute: 0.2 10*3/uL (ref 0.1–1.0)
Monocytes Relative: 5 % (ref 3–12)
Neutro Abs: 2 10*3/uL (ref 1.7–7.7)
Neutrophils Relative %: 45 % (ref 43–77)
PLATELETS: 173 10*3/uL (ref 150–400)
RBC: 4.18 MIL/uL (ref 3.87–5.11)
RDW: 12.3 % (ref 11.5–15.5)
WBC: 4.4 10*3/uL (ref 4.0–10.5)

## 2014-09-07 LAB — URINALYSIS, ROUTINE W REFLEX MICROSCOPIC
Bilirubin Urine: NEGATIVE
Glucose, UA: >1000 mg/dL — AB
HGB URINE DIPSTICK: NEGATIVE
KETONES UR: NEGATIVE mg/dL
Leukocytes, UA: NEGATIVE
Nitrite: NEGATIVE
PH: 7 (ref 5.0–8.0)
PROTEIN: NEGATIVE mg/dL
Specific Gravity, Urine: 1.025 (ref 1.005–1.030)
Urobilinogen, UA: 0.2 mg/dL (ref 0.0–1.0)

## 2014-09-07 LAB — BASIC METABOLIC PANEL
ANION GAP: 8 (ref 5–15)
BUN: 7 mg/dL (ref 6–20)
CALCIUM: 9.1 mg/dL (ref 8.9–10.3)
CHLORIDE: 100 mmol/L — AB (ref 101–111)
CO2: 26 mmol/L (ref 22–32)
Creatinine, Ser: 0.74 mg/dL (ref 0.44–1.00)
GFR calc Af Amer: >60 mL/min (ref >60)
GFR calc non Af Amer: >60 mL/min (ref >60)
GLUCOSE: 516 mg/dL — AB (ref 65–99)
Potassium: 3.9 mmol/L (ref 3.5–5.1)
Sodium: 134 mmol/L — ABNORMAL LOW (ref 135–145)

## 2014-09-07 LAB — URINE MICROSCOPIC-ADD ON

## 2014-09-07 MED ORDER — SODIUM CHLORIDE 0.9 % IV BOLUS (SEPSIS)
1000.0000 mL | Freq: Once | INTRAVENOUS | Status: AC
  Administered 2014-09-07: 1000 mL via INTRAVENOUS

## 2014-09-07 MED ORDER — INSULIN GLARGINE 100 UNIT/ML ~~LOC~~ SOLN
25.0000 [IU] | Freq: Once | SUBCUTANEOUS | Status: AC
  Administered 2014-09-07: 25 [IU] via SUBCUTANEOUS
  Filled 2014-09-07: qty 0.25

## 2014-09-07 MED ORDER — INSULIN GLARGINE 100 UNIT/ML ~~LOC~~ SOLN: 25.0000 [IU] | Freq: Every day | SUBCUTANEOUS | Status: DC

## 2014-09-07 MED ORDER — INSULIN ASPART 100 UNIT/ML ~~LOC~~ SOLN
10.0000 [IU] | Freq: Once | SUBCUTANEOUS | Status: AC
  Administered 2014-09-07: 10 [IU] via SUBCUTANEOUS
  Filled 2014-09-07: qty 1

## 2014-09-07 MED ORDER — SODIUM CHLORIDE 0.9 % IV BOLUS (SEPSIS)
1000.0000 mL | Freq: Once | INTRAVENOUS | Status: AC
Start: 2014-09-07 — End: 2014-09-07
  Administered 2014-09-07: 1000 mL via INTRAVENOUS

## 2014-09-07 NOTE — ED Notes (Signed)
Went to draw blood and the RN advised me that she was going to start an IV

## 2014-09-07 NOTE — ED Notes (Signed)
Per pt, states she was at MDs office-states sugar and BP were elevated-sent here for eval

## 2014-09-07 NOTE — ED Notes (Signed)
Pt report increased urination, blurred vision, hunger and increase thirst.

## 2014-09-07 NOTE — Discharge Instructions (Signed)

## 2014-09-07 NOTE — ED Provider Notes (Signed)
CSN: [id number]     Arrival date & time 09/07/14  1340 History   First MD Initiated Contact with Patient 09/07/14 1452     Chief Complaint  Patient presents with  . Hypertension  . Hyperglycemia     (Consider location/radiation/quality/duration/timing/severity/associated sxs/prior Treatment) HPI Comments: The pt is a 49 y/o female who has hx of DM and Htn, she has history of acid reflux, migraines, asthma. She states that she has been on metformin for approximately 2 years she was diagnosed with hyperglycemia, states that in the past her blood sugar has been between 70 and 90 irregularly, she has not been taking her blood sugar at home and sometimes stating that her glucometer is broken, she reports going to her doctor's office yesterday, had blood drawn, was called today to tell her that her blood sugar was elevated and she needed to be referred to the emergency department. The patient does endorse urinary frequency, polydipsia and general fatigue but denies focal weakness, vomiting, diarrhea, abdominal pain or back pain, chest pain or shortness of breath or coughs. She states that she has been very compliant with her medical regimen. She denies any other recent injuries including surgery trauma.  Patient is a 49 y.o. female presenting with hypertension and hyperglycemia. The history is provided by the patient.  Hypertension  Hyperglycemia   Past Medical History  Diagnosis Date  . SVD (spontaneous vaginal delivery)     x 2  . PONV (postoperative nausea and vomiting)   . Asthma   . Hyperlipidemia     diet controlled  . Seasonal allergies   . Sleep apnea     cpap setting 70  . Headache(784.0)     on rx med  . Arthritis     lower back, knee  . Anemia     history  . GERD (gastroesophageal reflux disease)   . Anxiety   . Mental disorder   . Esophageal reflux   . Obesity   . Depression   . Migraines   . H/O gestational diabetes mellitus, not currently pregnant   . OSA  (obstructive sleep apnea)     Mild w AHI 14/hr now on CPAP at 9cm H2O  . Hypertension   . Diabetes mellitus without complication    Past Surgical History  Procedure Laterality Date  . Abdominal hysterectomy    . Carpel tunnel surgery      x 2 - left and right  . Trigger thumb release      x 2 for right and left thumbs  . Wisdom tooth extraction    . Left foot surgery      remove cyst  . Cesarean section  1987    x 1  . Robotic assisted salpingo oopherectomy Left 08/08/2012    Procedure: ROBOTIC ASSISTED SALPINGO OOPHORECTOMY;  Surgeon: Serita Kyle, MD;  Location: [facility];  Service: Gynecology;  Laterality: Left;  . Robotic assisted laparoscopic lysis of adhesion N/A 08/08/2012    Procedure: ROBOTIC ASSISTED LAPAROSCOPIC exctensive LYSIS OF ADHESION;  Surgeon: Serita Kyle, MD;  Location: [facility];  Service: Gynecology;  Laterality: N/A;  . Cystoscopy N/A 08/08/2012    Procedure: CYSTOSCOPY;  Surgeon: Serita Kyle, MD;  Location: [facility];  Service: Gynecology;  Laterality: N/A;   Family History  Problem Relation Age of Onset  . Diabetes Mellitus II Mother   . Colon cancer Maternal Uncle   . Colon cancer Paternal Uncle   . Diabetes Maternal Grandmother   .  CAD Maternal Grandmother   . CAD Maternal Grandfather    History  Substance Use Topics  . Smoking status: Never Smoker   . Smokeless tobacco: Never Used  . Alcohol Use: No   OB History    No data available     Review of Systems  All other systems reviewed and are negative.     Allergies  Iodine  Home Medications   Prior to Admission medications   Medication Sig Start Date End Date Taking? Authorizing Provider  albuterol (PROVENTIL HFA;VENTOLIN HFA) 108 (90 BASE) MCG/ACT inhaler Inhale 2 puffs into the lungs every 6 (six) hours as needed for wheezing or shortness of breath.   Yes Historical Provider, MD  albuterol (PROVENTIL) (2.5 MG/3ML) 0.083% nebulizer solution Take 2.5 mg by nebulization  every 6 (six) hours as needed for wheezing or shortness of breath.   Yes Historical Provider, MD  ALPRAZolam Prudy Feeler) 0.5 MG tablet Take 0.5 mg by mouth at bedtime as needed for sleep or anxiety.   Yes Historical Provider, MD  budesonide-formoterol (SYMBICORT) 160-4.5 MCG/ACT inhaler Inhale 2 puffs into the lungs 2 (two) times daily.   Yes Historical Provider, MD  cyclobenzaprine (FLEXERIL) 10 MG tablet Take 10 mg by mouth 3 (three) times daily as needed for muscle spasms.   Yes Historical Provider, MD  DULoxetine (CYMBALTA) 60 MG capsule Take 60 mg by mouth daily.   Yes Historical Provider, MD  EPINEPHrine (EPIPEN 2-PAK) 0.3 mg/0.3 mL IJ SOAJ injection as needed. 10/25/12  Yes Historical Provider, MD  esomeprazole (NEXIUM) 40 MG capsule Take 40 mg by mouth daily before breakfast.   Yes Historical Provider, MD  estradiol (MINIVELLE) 0.1 MG/24HR patch Place 1 patch onto the skin daily.  11/29/12  Yes Historical Provider, MD  folic acid (FOLVITE) 1 MG tablet Take 1 mg by mouth daily.  07/24/14  Yes Historical Provider, MD  gabapentin (NEURONTIN) 300 MG capsule Take 300 mg by mouth 3 (three) times daily.   Yes Historical Provider, MD  ipratropium (ATROVENT) 0.02 % nebulizer solution Take 2.5 mLs (0.5 mg total) by nebulization 4 (four) times daily. Patient taking differently: Take 0.5 mg by nebulization every 6 (six) hours as needed for wheezing or shortness of breath.  04/12/14  Yes Arby Barrette, MD  losartan (COZAAR) 50 MG tablet Take 50 mg by mouth daily.  07/24/14  Yes Historical Provider, MD  meclizine (ANTIVERT) 25 MG tablet Take 25 mg by mouth 2 (two) times daily as needed for dizziness.   Yes Historical Provider, MD  meloxicam (MOBIC) 15 MG tablet Take 15 mg by mouth as needed for pain.    Yes Historical Provider, MD  metFORMIN (GLUCOPHAGE-XR) 500 MG 24 hr tablet Take 500 mg by mouth every evening.  05/26/13  Yes Historical Provider, MD  montelukast (SINGULAIR) 10 MG tablet Take 10 mg by mouth at  bedtime.   Yes Historical Provider, MD  naproxen (NAPROSYN) 500 MG tablet Take 500 mg by mouth 2 (two) times daily with a meal.  09/06/14  Yes Historical Provider, MD  SUMAtriptan (IMITREX) 100 MG tablet Take 100 mg by mouth every 2 (two) hours as needed for migraine.   Yes Historical Provider, MD  temazepam (RESTORIL) 15 MG capsule Take 15 mg by mouth at bedtime.   Yes Historical Provider, MD  topiramate (TOPAMAX) 25 MG tablet Take 75 mg by mouth at bedtime.   Yes Historical Provider, MD  Vitamin D, Ergocalciferol, (DRISDOL) 50000 UNITS CAPS Take 50,000 Units by mouth 3 (three)  times a week. Mondays, wednesdays, fridays   Yes Historical Provider, MD  azithromycin (ZITHROMAX Z-PAK) 250 MG tablet 2 po day one, then 1 daily x 4 days Patient not taking: Reported on 09/07/2014 04/12/14   Arby Barrette, MD  guaiFENesin (MUCINEX) 600 MG 12 hr tablet Take 1 tablet (600 mg total) by mouth 2 (two) times daily. Patient not taking: Reported on 09/07/2014 04/12/14   Arby Barrette, MD  insulin glargine (LANTUS) 100 UNIT/ML injection Inject 0.25 mLs (25 Units total) into the skin at bedtime. 09/07/14   Eber Hong, MD  methotrexate (RHEUMATREX) 2.5 MG tablet Take 10 mg by mouth once a week. On Wednesday. Caution:Chemotherapy. Protect from light.    Historical Provider, MD  oxyCODONE-acetaminophen (ROXICET) 5-325 MG per tablet Take 1 tablet by mouth every 4 (four) hours as needed for pain. Patient not taking: Reported on 09/07/2014 08/08/12   Maxie Better, MD  predniSONE (DELTASONE) 20 MG tablet 3 tabs po daily x 3 days, then 2 tabs x 3 days, then 1.5 tabs x 3 days, then 1 tab x 3 days, then 0.5 tabs x 3 days Patient not taking: Reported on 09/07/2014 04/12/14   Arby Barrette, MD   BP 142/85 mmHg  Pulse 61  Temp(Src) 98.7 F (37.1 C) (Oral)  Resp 18  SpO2 98% Physical Exam  Constitutional: She appears well-developed and well-nourished. No distress.  HENT:  Head: Normocephalic and atraumatic.   Mouth/Throat: Oropharynx is clear and moist. No oropharyngeal exudate.  Eyes: Conjunctivae and EOM are normal. Pupils are equal, round, and reactive to light. Right eye exhibits no discharge. Left eye exhibits no discharge. No scleral icterus.  Neck: Normal range of motion. Neck supple. No JVD present. No thyromegaly present.  Cardiovascular: Normal rate, regular rhythm, normal heart sounds and intact distal pulses.  Exam reveals no gallop and no friction rub.   No murmur heard. Pulmonary/Chest: Effort normal and breath sounds normal. No respiratory distress. She has no wheezes. She has no rales.  Abdominal: Soft. Bowel sounds are normal. She exhibits no distension and no mass. There is tenderness ( Minimal suprapubic discomfort, no guarding, no other abdominal tenderness).  Musculoskeletal: Normal range of motion. She exhibits no edema or tenderness.  Lymphadenopathy:    She has no cervical adenopathy.  Neurological: She is alert. Coordination normal.  Skin: Skin is warm and dry. No rash noted. No erythema.  Psychiatric: She has a normal mood and affect. Her behavior is normal.  Nursing note and vitals reviewed.   ED Course  Procedures (including critical care time) Labs Review Labs Reviewed  BASIC METABOLIC PANEL - Abnormal; Notable for the following:    Sodium 134 (*)    Chloride 100 (*)    Glucose, Bld 516 (*)    All other components within normal limits  CBC WITH DIFFERENTIAL/PLATELET - Abnormal; Notable for the following:    Lymphocytes Relative 49 (*)    All other components within normal limits  URINALYSIS, ROUTINE W REFLEX MICROSCOPIC (NOT AT [facility]) - Abnormal; Notable for the following:    APPearance CLOUDY (*)    Glucose, UA >1000 (*)    All other components within normal limits  URINE MICROSCOPIC-ADD ON - Abnormal; Notable for the following:    Squamous Epithelial / LPF MANY (*)    Bacteria, UA FEW (*)    All other components within normal limits  CBG MONITORING, ED -  Abnormal; Notable for the following:    Glucose-Capillary 488 (*)    All other  components within normal limits  CBG MONITORING, ED - Abnormal; Notable for the following:    Glucose-Capillary 363 (*)    All other components within normal limits  CBG MONITORING, ED - Abnormal; Notable for the following:    Glucose-Capillary 418 (*)    All other components within normal limits  CBG MONITORING, ED - Abnormal; Notable for the following:    Glucose-Capillary 385 (*)    All other components within normal limits  CBG MONITORING, ED - Abnormal; Notable for the following:    Glucose-Capillary 309 (*)    All other components within normal limits  CBG MONITORING, ED - Abnormal; Notable for the following:    Glucose-Capillary 323 (*)    All other components within normal limits    Imaging Review No results found.    MDM   Final diagnoses:  Hyperglycemia    Blood sugar 480, vital signs reveal blood pressure of 160/75, no fever, no tachycardia, she has normal lung sounds and no respiratory complaints. Will give fluids, suspect that the patient's medical regimen may need to be changed, certainly we need to get her blood sugar down with IV fluids and possibly insulin depending on how she responds to IV fluids, rule out urinary tract infection in DKA. The patient is in agreement with the plan.  Discussed with the pharmacist the dose for Lantus, they recommended 25 units at night. Labs have improved after insulin, the patient appears stable for discharge and expresses her understanding to the use of the new medication. She will follow up with her doctor on Monday.  Meds given in ED:  Medications  sodium chloride 0.9 % bolus 1,000 mL (0 mLs Intravenous Stopped 09/07/14 1816)  sodium chloride 0.9 % bolus 1,000 mL (0 mLs Intravenous Stopped 09/07/14 1844)  insulin aspart (novoLOG) injection 10 Units (10 Units Subcutaneous Given 09/07/14 1815)  insulin glargine (LANTUS) injection 25 Units (25 Units  Subcutaneous Given 09/07/14 2053)    Discharge Medication List as of 09/07/2014  8:22 PM    START taking these medications   Details  insulin glargine (LANTUS) 100 UNIT/ML injection Inject 0.25 mLs (25 Units total) into the skin at bedtime., Starting 09/07/2014, Until Discontinued, Print          Eber Hong, MD 09/08/14 0000

## 2014-09-07 NOTE — ED Notes (Signed)
cbg 488

## 2014-09-26 ENCOUNTER — Encounter: Payer: Self-pay | Admitting: Cardiology

## 2014-09-26 ENCOUNTER — Ambulatory Visit (INDEPENDENT_AMBULATORY_CARE_PROVIDER_SITE_OTHER): Payer: 59 | Admitting: Cardiology

## 2014-09-26 ENCOUNTER — Ambulatory Visit (INDEPENDENT_AMBULATORY_CARE_PROVIDER_SITE_OTHER): Payer: 59

## 2014-09-26 VITALS — BP 134/72 | HR 49 | Ht 64.0 in | Wt 285.0 lb

## 2014-09-26 DIAGNOSIS — R001 Bradycardia, unspecified: Secondary | ICD-10-CM

## 2014-09-26 DIAGNOSIS — G4733 Obstructive sleep apnea (adult) (pediatric): Secondary | ICD-10-CM

## 2014-09-26 DIAGNOSIS — I10 Essential (primary) hypertension: Secondary | ICD-10-CM | POA: Diagnosis not present

## 2014-09-26 DIAGNOSIS — E669 Obesity, unspecified: Secondary | ICD-10-CM

## 2014-09-26 DIAGNOSIS — R079 Chest pain, unspecified: Secondary | ICD-10-CM

## 2014-09-26 HISTORY — DX: Bradycardia, unspecified: R00.1

## 2014-09-26 LAB — BASIC METABOLIC PANEL
BUN: 8 mg/dL (ref 6–23)
CO2: 31 meq/L (ref 19–32)
Calcium: 9.8 mg/dL (ref 8.4–10.5)
Chloride: 99 mEq/L (ref 96–112)
Creatinine, Ser: 0.8 mg/dL (ref 0.40–1.20)
GFR: 97.75 mL/min (ref >60.00)
GLUCOSE: 272 mg/dL — AB (ref 70–99)
POTASSIUM: 3.8 meq/L (ref 3.5–5.1)
Sodium: 136 mEq/L (ref 135–145)

## 2014-09-26 LAB — TSH: TSH: 1.56 u[IU]/mL (ref 0.35–4.50)

## 2014-09-26 NOTE — Patient Instructions (Signed)
Medication Instructions:  Your physician recommends that you continue on your current medications as directed. Please refer to the Current Medication list given to you today.   Labwork: TODAY: BMET, TSH  Testing/Procedures: Your physician has requested that you have an exercise tolerance test. For further information please visit [email or web address] Please also follow instruction sheet, as given.   Your physician has recommended that you wear a holter monitor. Holter monitors are medical devices that record the heart's electrical activity. Doctors most often use these monitors to diagnose arrhythmias. Arrhythmias are problems with the speed or rhythm of the heartbeat. The monitor is a small, portable device. You can wear one while you do your normal daily activities. This is usually used to diagnose what is causing palpitations/syncope (passing out).  Follow-Up: Your physician wants you to follow-up in: 6 months with Dr. Sherlyn Lickurner/ You will receive a reminder letter in the mail two months in advance. If you don't receive a letter, please call our office to schedule the follow-up appointment.   Any Other Special Instructions Will Be Listed Below (If Applicable).

## 2014-09-26 NOTE — Progress Notes (Signed)
Cardiology Office Note   Date:  09/26/2014   ID:  [name] [name], DOB 01-23-1966, MRN [id number]  PCP:  Marjorie Smolder, MD    Chief Complaint  Patient presents with  . Follow-up    OSA      History of Present Illness: [name] [name] is a 49 y.o. female with a history of OSA, obesity and HTN presents today for followup. She is doing well. She tolerates her CPAP without any problems. She feels rested for the most part when she gets up. Lately she feels tired during the day and has to nap. She tolerates her full face mask but feels the pressure is too low.  She has felt weak and fatigued recently but no dizziness or syncope.  She has no mouth dryness and no head congestion.  SHe has complaining of chest tightness that comes and goes for the past month.  It occurs a few time weekly and lasts a few minutes at a time.  She will feel SOB with it.  Certain movements make it worse.      Past Medical History  Diagnosis Date  . SVD (spontaneous vaginal delivery)     x 2  . PONV (postoperative nausea and vomiting)   . Asthma   . Hyperlipidemia     diet controlled  . Seasonal allergies   . Sleep apnea     cpap setting 70  . Headache(784.0)     on rx med  . Arthritis     lower back, knee  . Anemia     history  . GERD (gastroesophageal reflux disease)   . Anxiety   . Mental disorder   . Esophageal reflux   . Obesity   . Depression   . Migraines   . H/O gestational diabetes mellitus, not currently pregnant   . OSA (obstructive sleep apnea)     Mild w AHI 14/hr now on CPAP at 9cm H2O  . Hypertension   . Diabetes mellitus without complication     Past Surgical History  Procedure Laterality Date  . Abdominal hysterectomy    . Carpel tunnel surgery      x 2 - left and right  . Trigger thumb release      x 2 for right and left thumbs  . Wisdom tooth extraction    . Left foot surgery      remove cyst  . Cesarean section  1987    x  1  . Robotic assisted salpingo oopherectomy Left 08/08/2012    Procedure: ROBOTIC ASSISTED SALPINGO OOPHORECTOMY;  Surgeon: Marvene Staff, MD;  Location: [facility];  Service: Gynecology;  Laterality: Left;  . Robotic assisted laparoscopic lysis of adhesion N/A 08/08/2012    Procedure: ROBOTIC ASSISTED LAPAROSCOPIC exctensive LYSIS OF ADHESION;  Surgeon: Marvene Staff, MD;  Location: [facility];  Service: Gynecology;  Laterality: N/A;  . Cystoscopy N/A 08/08/2012    Procedure: CYSTOSCOPY;  Surgeon: Marvene Staff, MD;  Location: [facility];  Service: Gynecology;  Laterality: N/A;     Current Outpatient Prescriptions  Medication Sig Dispense Refill  . ACCU-CHEK FASTCLIX LANCETS MISC     . ACCU-CHEK SMARTVIEW test strip     . albuterol (PROVENTIL HFA;VENTOLIN HFA) 108 (90 BASE) MCG/ACT inhaler Inhale 2 puffs into the lungs every 6 (six) hours as needed for wheezing or shortness of breath.    Marland Kitchen albuterol (PROVENTIL) (  2.5 MG/3ML) 0.083% nebulizer solution Take 2.5 mg by nebulization every 6 (six) hours as needed for wheezing or shortness of breath.    . ALPRAZolam (XANAX) 0.5 MG tablet Take 0.5 mg by mouth at bedtime as needed for sleep or anxiety.    . B-D ULTRAFINE III SHORT PEN 31G X 8 MM MISC See admin instructions.  0  . Blood Glucose Monitoring Suppl (ACCU-CHEK NANO SMARTVIEW) W/DEVICE KIT See admin instructions.  0  . budesonide-formoterol (SYMBICORT) 160-4.5 MCG/ACT inhaler Inhale 2 puffs into the lungs 2 (two) times daily.    . cyclobenzaprine (FLEXERIL) 10 MG tablet Take 10 mg by mouth 3 (three) times daily as needed for muscle spasms.    . DULoxetine (CYMBALTA) 60 MG capsule Take 60 mg by mouth daily.    Marland Kitchen EPINEPHrine (EPIPEN 2-PAK) 0.3 mg/0.3 mL IJ SOAJ injection as needed.    Marland Kitchen esomeprazole (NEXIUM) 40 MG capsule Take 40 mg by mouth daily before breakfast.    . estradiol (MINIVELLE) 0.1 MG/24HR patch Place 1 patch onto the skin daily.     . folic acid (FOLVITE) 1 MG tablet Take  1 mg by mouth daily.     Marland Kitchen gabapentin (NEURONTIN) 300 MG capsule Take 300 mg by mouth 3 (three) times daily.    . insulin glargine (LANTUS) 100 UNIT/ML injection Inject 0.25 mLs (25 Units total) into the skin at bedtime. 10 mL 11  . ipratropium (ATROVENT) 0.02 % nebulizer solution Take 2.5 mLs (0.5 mg total) by nebulization 4 (four) times daily. (Patient taking differently: Take 0.5 mg by nebulization every 6 (six) hours as needed for wheezing or shortness of breath. ) 75 mL 12  . levocetirizine (XYZAL) 5 MG tablet Take 5 mg by mouth every evening.  1  . losartan (COZAAR) 50 MG tablet Take 50 mg by mouth daily.     . meclizine (ANTIVERT) 25 MG tablet Take 25 mg by mouth 2 (two) times daily as needed for dizziness.    . meloxicam (MOBIC) 15 MG tablet Take 15 mg by mouth as needed for pain.     . metFORMIN (GLUCOPHAGE-XR) 500 MG 24 hr tablet Take 500 mg by mouth every evening.     . methotrexate (RHEUMATREX) 2.5 MG tablet Take 10 mg by mouth once a week. On Wednesday. Caution:Chemotherapy. Protect from light.    . montelukast (SINGULAIR) 10 MG tablet Take 10 mg by mouth at bedtime.    . naproxen (NAPROSYN) 500 MG tablet Take 500 mg by mouth 2 (two) times daily with a meal.     . SUMAtriptan (IMITREX) 100 MG tablet Take 100 mg by mouth every 2 (two) hours as needed for migraine.    . temazepam (RESTORIL) 15 MG capsule Take 15 mg by mouth at bedtime.    . topiramate (TOPAMAX) 25 MG tablet Take 75 mg by mouth at bedtime.    . Vitamin D, Ergocalciferol, (DRISDOL) 50000 UNITS CAPS Take 50,000 Units by mouth 3 (three) times a week. Mondays, wednesdays, fridays     No current facility-administered medications for this visit.    Allergies:   Iodine    Social History:  The patient  reports that she has never smoked. She has never used smokeless tobacco. She reports that she does not drink alcohol or use illicit drugs.   Family History:  The patient's family history includes CAD in her maternal  grandfather and maternal grandmother; Colon cancer in her maternal uncle and paternal uncle; Diabetes in her maternal grandmother;  Diabetes Mellitus II in her mother.    ROS:  Please see the history of present illness.   Otherwise, review of systems are positive for none.   All other systems are reviewed and negative.    PHYSICAL EXAM: VS:  BP 134/72 mmHg  Pulse 49  Ht '5\' 4"'  (1.626 m)  Wt 285 lb (129.275 kg)  BMI 48.90 kg/m2  SpO2 96% , BMI Body mass index is 48.9 kg/(m^2). GEN: Well nourished, well developed, in no acute distress HEENT: normal Neck: no JVD, carotid bruits, or masses Cardiac: RRR; no murmurs, rubs, or gallops,no edema  Respiratory:  clear to auscultation bilaterally, normal work of breathing GI: soft, nontender, nondistended, + BS MS: no deformity or atrophy Skin: warm and dry, no rash Neuro:  Strength and sensation are intact Psych: euthymic mood, full affect   EKG:  EKG was ordered today and showed sinus bradycardia at 48bpm with no ST changes    Recent Labs: 09/07/2014: BUN 7; Creatinine, Ser 0.74; Hemoglobin 12.6; Platelets 173; Potassium 3.9; Sodium 134*    Lipid Panel No results found for: CHOL, TRIG, HDL, CHOLHDL, VLDL, LDLCALC, LDLDIRECT    Wt Readings from Last 3 Encounters:  09/26/14 285 lb (129.275 kg)  04/11/14 280 lb (127.007 kg)  03/27/14 298 lb 6.4 oz (135.353 kg)    ASSESSMENT AND PLAN:  1. OSA on CPAP and tolerating well -I will get a download from her DME 2. HTN - controlled  -continue amlodipine/Dyazide  3. Obesity - encouraged to exercise   4.       Bradycardia on no meds to slow the heart rate. I will check a TSH to rule out hypothyroidism.  Check 48 hour Holter to assess for bradycarrhythmias.   5.         Chest pain with typical and atypical components.  I will get an ETT to assess for ischemia as well as assess chronotropic response to exercise.     Current medicines are reviewed at length with the patient today.   The patient does not have concerns regarding medicines.  The following changes have been made:  no change  Labs/ tests ordered today: See above Assessment and Plan No orders of the defined types were placed in this encounter.     Disposition:   FU with me in 6 months  Signed, [name] Margarita, MD  09/26/2014 3:31 PM    [facility] [address], [place], [place]  [address] Phone: [phone]; Fax: [phone]

## 2014-09-28 ENCOUNTER — Telehealth: Payer: Self-pay | Admitting: Cardiology

## 2014-09-28 NOTE — Telephone Encounter (Signed)
I spoke with the patient. 

## 2014-09-28 NOTE — Telephone Encounter (Signed)
I left a message for the patient to call. 

## 2014-09-28 NOTE — Telephone Encounter (Signed)
New Message   Pt wants a RN to call her back to give her reports on her labs

## 2014-10-02 ENCOUNTER — Encounter (HOSPITAL_COMMUNITY): Payer: 59

## 2014-10-05 ENCOUNTER — Ambulatory Visit (HOSPITAL_COMMUNITY)
Admission: RE | Admit: 2014-10-05 | Discharge: 2014-10-05 | Disposition: A | Discharge status: Home / Self Care | Payer: 59 | Source: Ambulatory Visit | Attending: Cardiology | Admitting: Cardiology

## 2014-10-05 DIAGNOSIS — R079 Chest pain, unspecified: Secondary | ICD-10-CM | POA: Diagnosis present

## 2014-10-05 LAB — EXERCISE TOLERANCE TEST
CHL CUP MPHR: 171 {beats}/min
CSEPEW: 8.2 METS
Exercise duration (min): 6 min
Exercise duration (sec): 49 s
Peak HR: 150 {beats}/min
Percent HR: 87 %
RPE: 16
Rest HR: 58 {beats}/min

## 2014-10-08 ENCOUNTER — Other Ambulatory Visit: Payer: Self-pay | Admitting: Family Medicine

## 2014-10-08 ENCOUNTER — Ambulatory Visit
Admission: RE | Admit: 2014-10-08 | Discharge: 2014-10-08 | Disposition: A | Discharge status: Home / Self Care | Payer: 59 | Source: Ambulatory Visit | Attending: Family Medicine | Admitting: Family Medicine

## 2014-10-08 DIAGNOSIS — R109 Unspecified abdominal pain: Secondary | ICD-10-CM

## 2014-10-09 ENCOUNTER — Other Ambulatory Visit: Payer: Self-pay | Admitting: Family Medicine

## 2014-10-10 ENCOUNTER — Telehealth: Payer: Self-pay

## 2014-10-10 DIAGNOSIS — I10 Essential (primary) hypertension: Secondary | ICD-10-CM

## 2014-10-10 NOTE — Telephone Encounter (Signed)
-----   Message from Quintella Reichertraci R Turner, MD sent at 10/07/2014  6:48 PM EDT ----- Please let patient know that stress test was fine but elevated BP with exercise - please do a 24 hour BP monitor

## 2014-10-10 NOTE — Telephone Encounter (Signed)
Informed patient of results and verbal understanding expressed.  24 hour BP monitor ordered for scheduling. Patient agrees with treatment plan. 

## 2014-10-11 ENCOUNTER — Ambulatory Visit
Admission: RE | Admit: 2014-10-11 | Discharge: 2014-10-11 | Disposition: A | Discharge status: Home / Self Care | Payer: 59 | Source: Ambulatory Visit | Attending: Family Medicine | Admitting: Family Medicine

## 2014-10-11 ENCOUNTER — Other Ambulatory Visit: Payer: Self-pay | Admitting: Family Medicine

## 2014-10-11 DIAGNOSIS — R109 Unspecified abdominal pain: Secondary | ICD-10-CM

## 2014-10-15 ENCOUNTER — Encounter: Payer: Self-pay | Admitting: *Deleted

## 2014-10-15 NOTE — Progress Notes (Signed)
Patient ID: [name] [name], female   DOB: 09/17/1965, 49 y.o.   MRN: [id number] Patient did not show up for 10/15/2014 4:00PM appointment to have a 24 hour ambulatory blood pressure monitor applied.

## 2014-10-23 ENCOUNTER — Ambulatory Visit (INDEPENDENT_AMBULATORY_CARE_PROVIDER_SITE_OTHER): Payer: 59

## 2014-10-23 ENCOUNTER — Encounter: Payer: Self-pay | Admitting: *Deleted

## 2014-10-23 DIAGNOSIS — I10 Essential (primary) hypertension: Secondary | ICD-10-CM

## 2014-10-23 NOTE — Progress Notes (Signed)
Patient ID: [name] [name], female   DOB: 03-03-1966, 49 y.o.   MRN: [id number] 24 hour ambulatory blood pressure monitor applied to patient.

## 2014-10-26 ENCOUNTER — Other Ambulatory Visit (HOSPITAL_COMMUNITY): Payer: Self-pay | Admitting: Family Medicine

## 2014-10-26 DIAGNOSIS — R109 Unspecified abdominal pain: Secondary | ICD-10-CM

## 2014-10-26 DIAGNOSIS — K828 Other specified diseases of gallbladder: Secondary | ICD-10-CM

## 2014-10-29 ENCOUNTER — Telehealth: Payer: Self-pay | Admitting: Cardiology

## 2014-10-29 NOTE — Telephone Encounter (Signed)
Mailbox is full - unable to leave message.  Will try again later. 

## 2014-10-29 NOTE — Telephone Encounter (Signed)
Please let patient know that BP monitor showed normal BP with average 113/41mmHg

## 2014-10-30 NOTE — Telephone Encounter (Signed)
Informed patient of results and verbal understanding expressed.  

## 2014-11-05 ENCOUNTER — Encounter (HOSPITAL_COMMUNITY)
Admission: RE | Admit: 2014-11-05 | Discharge: 2014-11-05 | Disposition: A | Discharge status: Home / Self Care | Payer: 59 | Source: Ambulatory Visit | Attending: Family Medicine | Admitting: Family Medicine

## 2014-11-05 DIAGNOSIS — K828 Other specified diseases of gallbladder: Secondary | ICD-10-CM | POA: Diagnosis present

## 2014-11-05 DIAGNOSIS — R109 Unspecified abdominal pain: Secondary | ICD-10-CM | POA: Diagnosis not present

## 2014-11-05 MED ORDER — TECHNETIUM TC 99M MEBROFENIN IV KIT
5.5000 | PACK | Freq: Once | INTRAVENOUS | Status: AC | PRN
  Administered 2014-11-05: 5.5 via INTRAVENOUS

## 2014-12-17 ENCOUNTER — Other Ambulatory Visit: Payer: Self-pay

## 2014-12-17 DIAGNOSIS — Z1231 Encounter for screening mammogram for malignant neoplasm of breast: Secondary | ICD-10-CM

## 2015-01-04 ENCOUNTER — Ambulatory Visit
Admission: RE | Admit: 2015-01-04 | Discharge: 2015-01-04 | Disposition: A | Discharge status: Home / Self Care | Payer: 59 | Source: Ambulatory Visit

## 2015-01-04 DIAGNOSIS — Z1231 Encounter for screening mammogram for malignant neoplasm of breast: Secondary | ICD-10-CM

## 2015-01-07 ENCOUNTER — Ambulatory Visit: Payer: 59

## 2015-01-23 ENCOUNTER — Telehealth: Payer: Self-pay | Admitting: Cardiology

## 2015-01-23 ENCOUNTER — Other Ambulatory Visit: Payer: Self-pay | Admitting: Orthopedic Surgery

## 2015-01-23 DIAGNOSIS — R079 Chest pain, unspecified: Secondary | ICD-10-CM

## 2015-01-23 NOTE — Telephone Encounter (Signed)
Please get a stress myoview to rule out ischemia and 2D echo and hold off on appt on Friday until we get results of studies

## 2015-01-23 NOTE — Telephone Encounter (Signed)
Pt c/o of Chest Pain: STAT if CP now or developed within 24 hours  1. Are you having CP right now? No  2. Are you experiencing any other symptoms (ex. SOB, nausea, vomiting, sweating)? SOB and some sweating, Lt arm pain  3. How long have you been experiencing CP? Sunday  4. Is your CP continuous or coming and going? Coming and going  5. Have you taken Nitroglycerin? No ?

## 2015-01-23 NOTE — Telephone Encounter (Signed)
F/u       Pt calling f/u on previous message and stating that she is still waiting for a call back from Dr. Norris Crossurner's nurse. Please call back and advise.

## 2015-01-23 NOTE — Telephone Encounter (Signed)
Patient called c/o CP. Since Sunday, she has had 1 episode of "squeezing, grabbing" CP daily that radiates from the top part of her chest down her L arm and persists for a few minutes.. The episodes are not associated with eating and are different times through the day.  During the episodes, she is a little short of breath. She cannot lie down during an episode or SOB worsens. Patient is taking medications as directed. She has no VS to report. Per Theodore Demarkhonda Barrett, Flex, instructed patient to call 911 if another episode happens again. Patient understands Dr. Mayford Knifeurner will be consulted for further recommendations and she will be called tomorrow with instructions.

## 2015-01-23 NOTE — Telephone Encounter (Signed)
New message   Pt is calling back still waiting on a call form a RN  Please call pt

## 2015-01-24 NOTE — Telephone Encounter (Signed)
Stress myoview and ECHO ordered for scheduling. Instructions reviewed with patient and she has no questions.

## 2015-01-25 ENCOUNTER — Ambulatory Visit: Payer: 59 | Admitting: Cardiology

## 2015-01-30 ENCOUNTER — Telehealth (HOSPITAL_COMMUNITY): Payer: Self-pay

## 2015-01-30 NOTE — Telephone Encounter (Signed)
Patient given detailed instructions per Myocardial Perfusion Study Information Sheet for the test on 02-04-2015 at 1230. Patient notified to arrive at 11:15 for Echo and that it is imperative to arrive on time for appointment to keep from having the test rescheduled.  If you need to cancel or reschedule your appointment, please call the office within 24 hours of your appointment. Failure to do so may result in a cancellation of your appointment, and a $50 no show fee. Patient verbalized understanding.Randa EvensEdwards, Nathanyal Ashmead A

## 2015-02-04 ENCOUNTER — Ambulatory Visit (HOSPITAL_BASED_OUTPATIENT_CLINIC_OR_DEPARTMENT_OTHER): Payer: 59

## 2015-02-04 ENCOUNTER — Ambulatory Visit (HOSPITAL_COMMUNITY): Payer: 59 | Attending: Cardiology

## 2015-02-04 ENCOUNTER — Other Ambulatory Visit: Payer: Self-pay

## 2015-02-04 DIAGNOSIS — R079 Chest pain, unspecified: Secondary | ICD-10-CM | POA: Insufficient documentation

## 2015-02-04 DIAGNOSIS — I517 Cardiomegaly: Secondary | ICD-10-CM | POA: Diagnosis not present

## 2015-02-04 DIAGNOSIS — E785 Hyperlipidemia, unspecified: Secondary | ICD-10-CM | POA: Diagnosis not present

## 2015-02-04 DIAGNOSIS — I10 Essential (primary) hypertension: Secondary | ICD-10-CM | POA: Diagnosis not present

## 2015-02-04 DIAGNOSIS — Z6841 Body Mass Index (BMI) 40.0 and over, adult: Secondary | ICD-10-CM | POA: Insufficient documentation

## 2015-02-04 DIAGNOSIS — E119 Type 2 diabetes mellitus without complications: Secondary | ICD-10-CM | POA: Diagnosis not present

## 2015-02-04 MED ORDER — TECHNETIUM TC 99M SESTAMIBI GENERIC - CARDIOLITE
32.5000 | Freq: Once | INTRAVENOUS | Status: AC | PRN
  Administered 2015-02-04: 33 via INTRAVENOUS

## 2015-02-05 ENCOUNTER — Ambulatory Visit (HOSPITAL_COMMUNITY): Payer: 59 | Attending: Cardiovascular Disease

## 2015-02-05 DIAGNOSIS — R0989 Other specified symptoms and signs involving the circulatory and respiratory systems: Secondary | ICD-10-CM

## 2015-02-05 LAB — MYOCARDIAL PERFUSION IMAGING
CHL CUP NUCLEAR SDS: 4
CHL CUP NUCLEAR SRS: 0
CHL CUP NUCLEAR SSS: 4
CSEPEW: 9.3 METS
CSEPPHR: 153 {beats}/min
Exercise duration (min): 7 min
Exercise duration (sec): 31 s
LV sys vol: 47 mL
LVDIAVOL: 121 mL
MPHR: 170 {beats}/min
RATE: 0.22
RPE: 18
Rest HR: 64 {beats}/min
TID: 0.96

## 2015-02-05 MED ORDER — TECHNETIUM TC 99M SESTAMIBI GENERIC - CARDIOLITE
32.1000 | Freq: Once | INTRAVENOUS | Status: AC | PRN
  Administered 2015-02-05: 32.1 via INTRAVENOUS

## 2015-02-06 ENCOUNTER — Telehealth: Payer: Self-pay | Admitting: Cardiology

## 2015-02-06 NOTE — Telephone Encounter (Signed)
New message      Please call pt regarding getting [facility].  She also stated that she was having problems.  I called her to schedule her 74mo sleep appt for jan and she explained that she was having trouble getting her supplies and that she was having problems.

## 2015-02-06 NOTE — Telephone Encounter (Signed)
[facility]. Informed patient the office CPAP assistant [facility] to [facility] and will return patient's call ASAP. Patient grateful for call.

## 2015-02-07 NOTE — Telephone Encounter (Signed)
Follow up    Patients calling back to speak with nurse

## 2015-02-07 NOTE — Telephone Encounter (Signed)
[facility] and [facility] informed me that they needed a download from the patient before they can give her new supplies.  I informed the patient that [facility].

## 2015-02-11 ENCOUNTER — Other Ambulatory Visit (INDEPENDENT_AMBULATORY_CARE_PROVIDER_SITE_OTHER): Payer: 59 | Admitting: *Deleted

## 2015-02-11 ENCOUNTER — Telehealth: Payer: Self-pay

## 2015-02-11 DIAGNOSIS — R0789 Other chest pain: Secondary | ICD-10-CM | POA: Diagnosis not present

## 2015-02-11 MED ORDER — ESOMEPRAZOLE MAGNESIUM 40 MG PO CPDR: 40.0000 mg | DELAYED_RELEASE_CAPSULE | Freq: Two times a day (BID) | ORAL | Status: DC

## 2015-02-11 NOTE — Telephone Encounter (Signed)
Patient to come in TODAY for BNP. Instructed patient to INCREASE NEXIUM to 40 mg BID. OV scheduled with Carlean JewsKatie Thompson 12/19. Patient agrees with treatment plan.

## 2015-02-11 NOTE — Telephone Encounter (Signed)
-----   Message from Quintella Reichertraci R Turner, MD sent at 02/07/2015 11:26 AM EST ----- Increase nexium to 40mg  BID and followup with extender in 4 weeks.  Also have her come in for a BNP

## 2015-02-12 LAB — BRAIN NATRIURETIC PEPTIDE: Brain Natriuretic Peptide: 3.3 pg/mL (ref 0.0–100.0)

## 2015-02-13 ENCOUNTER — Telehealth: Payer: Self-pay | Admitting: Cardiology

## 2015-02-13 NOTE — Telephone Encounter (Signed)
-----   Message from Quintella Reichertraci R Turner, MD sent at 02/12/2015  7:29 PM EST ----- Please let patient know that labs were normal.  Continue current medical therapy.

## 2015-02-13 NOTE — Telephone Encounter (Signed)
Informed patient of results and verbal understanding expressed.  

## 2015-02-13 NOTE — Telephone Encounter (Signed)
F/u ° ° ° ° ° ° °Pt returning Katy's phone call. °

## 2015-03-08 ENCOUNTER — Ambulatory Visit: Payer: 59 | Admitting: Cardiology

## 2015-03-16 NOTE — Progress Notes (Signed)
Cardiology Office Note   Date:  03/18/2015   ID:  [name] [name], DOB [birth year], MRN [id number]  PCP:  Hollice Espy, MD  Cardiologist:  Dr. Mayford Knife   4 week follow up    History of Present Illness: [name] [name] is a 49 y.o. female with a history of OSA on CPAP, DM, obesity, and HTN who presents to clinic for follow up.   She was last seen by Dr. Mayford Knife on 09/26/14. She complained of chest pain and ETT was ordered. She was also noted to have bradycardia on no rate lowering medications and a 48 hour monitor was placed. 48 hour holter monitor revealed average HR of 62 with occasional PVCs and PACs. ETT was normal but she had a hypertensive response to exercise and a 24 hour BP monitor was ordered, which returned normal. 2D ECHO with normal LV size with mild LVH. EF 55-60%. She continued to complain of CP and SOB and a BNP and ett myoview were ordered. Both returned normal. Nexium increased to  BID.  Today she presents to clinic for follow up. Since her last visit she has been feeling good with no complaints. She has had no CP. SOB is a lot better. No LE edema, orthopnea, PND. She wears CPAP daily. She has noticed less fatigue. She has not been exercising but knows she should. She tries to watch what she eats. She eats a lot of chicken and vegetables. No dizziness or passing out.     Past Medical History  Diagnosis Date  . SVD (spontaneous vaginal delivery)     x 2  . PONV (postoperative nausea and vomiting)   . Asthma   . Hyperlipidemia     diet controlled  . Seasonal allergies   . Sleep apnea     cpap setting 70  . Headache(784.0)     on rx med  . Arthritis     lower back, knee  . Anemia     history  . GERD (gastroesophageal reflux disease)   . Anxiety   . Mental disorder   . Esophageal reflux   . Obesity   . Depression   . Migraines   . H/O gestational diabetes mellitus, not currently pregnant   . OSA (obstructive sleep apnea)     Mild w AHI 14/hr now on CPAP at 9cm H2O  . Hypertension   . Diabetes mellitus without complication (HCC)   . Bradycardia 09/26/2014    Past Surgical History  Procedure Laterality Date  . Abdominal hysterectomy    . Carpel tunnel surgery      x 2 - left and right  . Trigger thumb release      x 2 for right and left thumbs  . Wisdom tooth extraction    . Left foot surgery      remove cyst  . Cesarean section  1987    x 1  . Robotic assisted salpingo oopherectomy Left 08/08/2012    Procedure: ROBOTIC ASSISTED SALPINGO OOPHORECTOMY;  Surgeon: Serita Kyle, MD;  Location: [facility];  Service: Gynecology;  Laterality: Left;  . Robotic assisted laparoscopic lysis of adhesion N/A 08/08/2012    Procedure: ROBOTIC ASSISTED LAPAROSCOPIC exctensive LYSIS OF ADHESION;  Surgeon: Serita Kyle, MD;  Location: [facility];  Service: Gynecology;  Laterality: N/A;  . Cystoscopy N/A 08/08/2012    Procedure: CYSTOSCOPY;  Surgeon: Serita Kyle, MD;  Location: [facility];  Service: Gynecology;  Laterality: N/A;     Current  Outpatient Prescriptions  Medication Sig Dispense Refill  . albuterol (PROVENTIL HFA;VENTOLIN HFA) 108 (90 BASE) MCG/ACT inhaler Inhale 2 puffs into the lungs every 6 (six) hours as needed for wheezing or shortness of breath.    Marland Kitchen albuterol (PROVENTIL) (2.5 MG/3ML) 0.083% nebulizer solution Take 2.5 mg by nebulization every 6 (six) hours as needed for wheezing or shortness of breath.    . ALPRAZolam (XANAX) 0.5 MG tablet Take 0.5 mg by mouth at bedtime as needed for sleep or anxiety.    Marland Kitchen aspirin 81 MG tablet Take 81 mg by mouth daily.    . B-D ULTRAFINE III SHORT PEN 31G X 8 MM MISC See admin instructions.  0  . budesonide-formoterol (SYMBICORT) 160-4.5 MCG/ACT inhaler Inhale 2 puffs into the lungs 2 (two) times daily.    . cyclobenzaprine (FLEXERIL) 10 MG tablet Take 10 mg by mouth 3 (three) times daily as needed for muscle spasms.    . DULoxetine (CYMBALTA) 60 MG capsule  Take 60 mg by mouth daily.    Marland Kitchen EPINEPHrine (EPIPEN 2-PAK) 0.3 mg/0.3 mL IJ SOAJ injection as needed.    Marland Kitchen esomeprazole (NEXIUM) 40 MG capsule Take 1 capsule (40 mg total) by mouth 2 (two) times daily before a meal. 60 capsule 11  . estradiol (CLIMARA - DOSED IN MG/24 HR) 0.1 mg/24hr patch Place 0.1 mg onto the skin once a week.    . folic acid (FOLVITE) 1 MG tablet Take 1 mg by mouth daily.     Marland Kitchen gabapentin (NEURONTIN) 300 MG capsule Take 300 mg by mouth 3 (three) times daily.    . insulin glargine (LANTUS) 100 UNIT/ML injection Inject 30 Units into the skin at bedtime.    Marland Kitchen ipratropium (ATROVENT) 0.02 % nebulizer solution Take 2.5 mLs (0.5 mg total) by nebulization 4 (four) times daily. 75 mL 12  . levocetirizine (XYZAL) 5 MG tablet Take 5 mg by mouth every evening.  1  . losartan (COZAAR) 50 MG tablet Take 50 mg by mouth daily.     . meclizine (ANTIVERT) 25 MG tablet Take 25 mg by mouth 2 (two) times daily as needed for dizziness.    . meloxicam (MOBIC) 15 MG tablet Take 15 mg by mouth daily as needed for pain.    . metFORMIN (GLUCOPHAGE-XR) 500 MG 24 hr tablet Take 1,000 mg by mouth daily.    . methotrexate (RHEUMATREX) 2.5 MG tablet Take 10 mg by mouth once a week. On Wednesday. Caution:Chemotherapy. Protect from light.    . montelukast (SINGULAIR) 10 MG tablet Take 10 mg by mouth at bedtime.    . naproxen (NAPROSYN) 500 MG tablet Take 500 mg by mouth 2 (two) times daily with a meal.     . SUMAtriptan (IMITREX) 100 MG tablet Take 100 mg by mouth every 2 (two) hours as needed for migraine.    . temazepam (RESTORIL) 15 MG capsule Take 15 mg by mouth at bedtime.    . topiramate (TOPAMAX) 25 MG tablet Take 75 mg by mouth at bedtime.    . Vitamin D, Ergocalciferol, (DRISDOL) 50000 UNITS CAPS Take 50,000 Units by mouth 3 (three) times a week. Mondays, wednesdays, fridays     No current facility-administered medications for this visit.    Allergies:   Iodine    Social History:  The patient   reports that she has never smoked. She has never used smokeless tobacco. She reports that she does not drink alcohol or use illicit drugs.   Family History:  The patient's family history includes CAD in her maternal grandfather and maternal grandmother; Colon cancer in her maternal uncle and paternal uncle; Diabetes in her maternal grandmother; Diabetes Mellitus II in her mother.    ROS:  Please see the history of present illness.   Otherwise, review of systems are positive for NONE.   All other systems are reviewed and negative.    PHYSICAL EXAM: VS:  BP 110/70 mmHg  Pulse 76  Ht 5\' 4"  (1.626 m)  Wt 290 lb 12.8 oz (131.906 kg)  BMI 49.89 kg/m2 , BMI Body mass index is 49.89 kg/(m^2). GEN: Well nourished, well developed, in no acute distressobese HEENT: normal Neck: no JVD, carotid bruits, or masses Cardiac:RRR; no murmurs, rubs, or gallops,no edema  Respiratory:  clear to auscultation bilaterally, normal work of breathing GI: soft, nontender, nondistended, + BS MS: no deformity or atrophy Skin: warm and dry, no rash Neuro:  Strength and sensation are intact Psych: euthymic mood, full affect   EKG:  EKG is not ordered today.    Recent Labs: 09/07/2014: Hemoglobin 12.6; Platelets 173 09/26/2014: BUN 8; Creatinine, Ser 0.80; Potassium 3.8; Sodium 136; TSH 1.56    Lipid Panel No results found for: CHOL, TRIG, HDL, CHOLHDL, VLDL, LDLCALC, LDLDIRECT    Wt Readings from Last 3 Encounters:  03/18/15 290 lb 12.8 oz (131.906 kg)  02/04/15 285 lb (129.275 kg)  09/26/14 285 lb (129.275 kg)      Other studies Reviewed: Additional studies records that were reviewed today include: 2D ECHO, ETT, nuclear stress test Review of the above records demonstrates:   2D ECHO: 02/04/2015 LV EF: 55% -  60% Study Conclusions - Left ventricle: The cavity size was normal. Wall thickness was increased in a pattern of mild LVH. Systolic function was normal. The estimated ejection fraction  was in the range of 55% to 60%. Although no diagnostic regional wall motion abnormality was identified, this possibility cannot be completely excluded on the basis of this study. Left ventricular diastolic function parameters were normal. - Aortic valve: There was no stenosis. - Mitral valve: There was no significant regurgitation. - Right ventricle: The cavity size was normal. Systolic function was normal. - Pulmonary arteries: No complete TR doppler jet so unable to estimate PA systolic pressure. - Inferior vena cava: The vessel was normal in size. The respirophasic diameter changes were in the normal range (>= 50%), consistent with normal central venous pressure. Impressions: - Normal LV size with mild LV hypertrophy. EF 55-60%. Normal diastolic function. Normal RV size and systolic function. No significant valvular abnormalities.  ETT (10/05/14): No ST segment changes but hypertensive response to exercise.   ETT Myoview (02/05/15) EF 61%, no ST segment during stress, low risk study.    ASSESSMENT AND PLAN:  [name] [name] is a 49 y.o. female with a history of OSA on CPAP, DM, obesity, and HTN who presents to clinic for follow up.  Chest pain/SOB: normal BNP, ECHO, GXT and exercise myoview. Likely due to obesity and deconditioning. She does not complain of CP or SOB today.   OSA: continue CPAP. Followed by Dr. Mayford Knifeurner  HTN: continue Losartan 50mg  qd. Recent 24 hour BP monitor with good control   Bradycardia: recent 48 hour Holter with average HR 62 with occasional PACs and PVCs  DM: continue home regimen  Obesity: weight loss recommended. She says she plans to start exercising   Current medicines are reviewed at length with the patient today.  The patient does not have  concerns regarding medicines.  The following changes have been made:  no change  Labs/ tests ordered today include:  No orders of the defined types were placed in this  encounter.     Disposition:   FU with Dr. Mayford Knife for CPAP titration early next year. Patient has to leave and she will schedule herself later.   Billy Fischer, PA-C  03/18/2015 3:20 PM    [facility] [address] [place], [place], [place]  [address] Phone: [phone]; Fax: [phone]

## 2015-03-18 ENCOUNTER — Ambulatory Visit (INDEPENDENT_AMBULATORY_CARE_PROVIDER_SITE_OTHER): Payer: 59 | Admitting: Physician Assistant

## 2015-03-18 ENCOUNTER — Encounter: Payer: Self-pay | Admitting: Physician Assistant

## 2015-03-18 VITALS — BP 110/70 | HR 76 | Ht 64.0 in | Wt 290.8 lb

## 2015-03-18 DIAGNOSIS — R0602 Shortness of breath: Secondary | ICD-10-CM

## 2015-03-18 NOTE — Patient Instructions (Signed)
Medication Instructions:  None  Labwork: None  Testing/Procedures: None  Follow-Up: Your physician recommends that you schedule a follow-up appointment with Dr. Mayford Knifeurner next available for CPAP   Any Other Special Instructions Will Be Listed Below (If Applicable).     If you need a refill on your cardiac medications before your next appointment, please call your pharmacy.

## 2015-04-03 ENCOUNTER — Ambulatory Visit: Payer: 59 | Admitting: Cardiology

## 2015-06-10 ENCOUNTER — Emergency Department (HOSPITAL_COMMUNITY)
Admission: EM | Admit: 2015-06-10 | Discharge: 2015-06-10 | Disposition: A | Discharge status: Home / Self Care | Payer: 59 | Attending: Emergency Medicine | Admitting: Emergency Medicine

## 2015-06-10 ENCOUNTER — Encounter (HOSPITAL_COMMUNITY): Payer: Self-pay | Admitting: Family Medicine

## 2015-06-10 ENCOUNTER — Emergency Department (HOSPITAL_COMMUNITY): Payer: 59

## 2015-06-10 DIAGNOSIS — K219 Gastro-esophageal reflux disease without esophagitis: Secondary | ICD-10-CM | POA: Diagnosis not present

## 2015-06-10 DIAGNOSIS — E669 Obesity, unspecified: Secondary | ICD-10-CM | POA: Diagnosis not present

## 2015-06-10 DIAGNOSIS — M545 Low back pain, unspecified: Secondary | ICD-10-CM

## 2015-06-10 DIAGNOSIS — F419 Anxiety disorder, unspecified: Secondary | ICD-10-CM | POA: Diagnosis not present

## 2015-06-10 DIAGNOSIS — F329 Major depressive disorder, single episode, unspecified: Secondary | ICD-10-CM | POA: Diagnosis not present

## 2015-06-10 DIAGNOSIS — E119 Type 2 diabetes mellitus without complications: Secondary | ICD-10-CM | POA: Insufficient documentation

## 2015-06-10 DIAGNOSIS — Z79899 Other long term (current) drug therapy: Secondary | ICD-10-CM | POA: Diagnosis not present

## 2015-06-10 DIAGNOSIS — Z794 Long term (current) use of insulin: Secondary | ICD-10-CM | POA: Diagnosis not present

## 2015-06-10 DIAGNOSIS — K8020 Calculus of gallbladder without cholecystitis without obstruction: Secondary | ICD-10-CM

## 2015-06-10 DIAGNOSIS — Z862 Personal history of diseases of the blood and blood-forming organs and certain disorders involving the immune mechanism: Secondary | ICD-10-CM | POA: Diagnosis not present

## 2015-06-10 DIAGNOSIS — R109 Unspecified abdominal pain: Secondary | ICD-10-CM

## 2015-06-10 DIAGNOSIS — Z8669 Personal history of other diseases of the nervous system and sense organs: Secondary | ICD-10-CM | POA: Diagnosis not present

## 2015-06-10 DIAGNOSIS — J45909 Unspecified asthma, uncomplicated: Secondary | ICD-10-CM | POA: Diagnosis not present

## 2015-06-10 DIAGNOSIS — I10 Essential (primary) hypertension: Secondary | ICD-10-CM | POA: Insufficient documentation

## 2015-06-10 DIAGNOSIS — G43909 Migraine, unspecified, not intractable, without status migrainosus: Secondary | ICD-10-CM | POA: Diagnosis not present

## 2015-06-10 DIAGNOSIS — M1990 Unspecified osteoarthritis, unspecified site: Secondary | ICD-10-CM | POA: Diagnosis not present

## 2015-06-10 DIAGNOSIS — Z7982 Long term (current) use of aspirin: Secondary | ICD-10-CM | POA: Diagnosis not present

## 2015-06-10 LAB — COMPREHENSIVE METABOLIC PANEL
ALBUMIN: 3.6 g/dL (ref 3.5–5.0)
ALK PHOS: 108 U/L (ref 38–126)
ALT: 34 U/L (ref 14–54)
ANION GAP: 13 (ref 5–15)
AST: 56 U/L — ABNORMAL HIGH (ref 15–41)
BUN: 10 mg/dL (ref 6–20)
CALCIUM: 9.2 mg/dL (ref 8.9–10.3)
CHLORIDE: 103 mmol/L (ref 101–111)
CO2: 29 mmol/L (ref 22–32)
Creatinine, Ser: 0.8 mg/dL (ref 0.44–1.00)
GFR calc Af Amer: >60 mL/min (ref >60)
GFR calc non Af Amer: >60 mL/min (ref >60)
GLUCOSE: 90 mg/dL (ref 65–99)
Potassium: 3.9 mmol/L (ref 3.5–5.1)
SODIUM: 145 mmol/L (ref 135–145)
Total Bilirubin: 0.7 mg/dL (ref 0.3–1.2)
Total Protein: 8.1 g/dL (ref 6.5–8.1)

## 2015-06-10 LAB — CBC
HEMATOCRIT: 38.1 % (ref 36.0–46.0)
HEMOGLOBIN: 12.1 g/dL (ref 12.0–15.0)
MCH: 29.9 pg (ref 26.0–34.0)
MCHC: 31.8 g/dL (ref 30.0–36.0)
MCV: 94.1 fL (ref 78.0–100.0)
PLATELETS: 201 10*3/uL (ref 150–400)
RBC: 4.05 MIL/uL (ref 3.87–5.11)
RDW: 13 % (ref 11.5–15.5)
WBC: 5.4 10*3/uL (ref 4.0–10.5)

## 2015-06-10 LAB — LIPASE, BLOOD: Lipase: 39 U/L (ref 11–51)

## 2015-06-10 MED ORDER — HYDROMORPHONE HCL 1 MG/ML IJ SOLN
1.0000 mg | Freq: Once | INTRAMUSCULAR | Status: AC
  Administered 2015-06-10: 1 mg via INTRAMUSCULAR
  Filled 2015-06-10: qty 1

## 2015-06-10 MED ORDER — LORAZEPAM 2 MG/ML IJ SOLN
1.0000 mg | Freq: Once | INTRAMUSCULAR | Status: AC
  Administered 2015-06-10: 1 mg via INTRAVENOUS
  Filled 2015-06-10: qty 1

## 2015-06-10 MED ORDER — IBUPROFEN 400 MG PO TABS
400.0000 mg | ORAL_TABLET | Freq: Once | ORAL | Status: AC
  Administered 2015-06-10: 400 mg via ORAL
  Filled 2015-06-10: qty 1

## 2015-06-10 MED ORDER — HYDROCODONE-ACETAMINOPHEN 5-325 MG PO TABS: 1.0000 | ORAL_TABLET | Freq: Four times a day (QID) | ORAL | Status: DC | PRN

## 2015-06-10 MED ORDER — ONDANSETRON 4 MG PO TBDP
4.0000 mg | ORAL_TABLET | Freq: Once | ORAL | Status: AC
  Administered 2015-06-10: 4 mg via ORAL
  Filled 2015-06-10: qty 1

## 2015-06-10 NOTE — ED Notes (Signed)
Discharge instructions reviewed - voiced understanding 

## 2015-06-10 NOTE — ED Notes (Signed)
Pt here for right flank pain since Saturday. Sent here by her doctor to r/o kidney stone. Pt given pain shot at PCP with some relief.

## 2015-06-10 NOTE — ED Provider Notes (Signed)
CSN: [id number]     Arrival date & time 06/10/15  1328 History   First MD Initiated Contact with Patient 06/10/15 1906     Chief Complaint  Patient presents with  . Abdominal Pain     (Consider location/radiation/quality/duration/timing/severity/associated sxs/prior Treatment) Patient is a 50 y.o. female presenting with abdominal pain. The history is provided by the patient.  Abdominal Pain Associated symptoms: no chest pain, no chills, no cough, no dysuria, no fever, no shortness of breath, no sore throat, no vaginal bleeding, no vaginal discharge and no vomiting   Patient c/o right flank pain, acute onset today, at rest. Pain constant, dull, severe, waxes and wanes in severity. No specific exacerbating or alleviating factors. No dysuria or hematuria. Remote hx hysterectomy. No anterior/abd pain. No fever or chills. Denies injury or strain to area. No midline back pain or radicular pain. No swelling or skin rash to area of pain. No hx same pain. No hx kidney stones.      Past Medical History  Diagnosis Date  . SVD (spontaneous vaginal delivery)     x 2  . PONV (postoperative nausea and vomiting)   . Asthma   . Hyperlipidemia     diet controlled  . Seasonal allergies   . Sleep apnea     cpap setting 70  . Headache(784.0)     on rx med  . Arthritis     lower back, knee  . Anemia     history  . GERD (gastroesophageal reflux disease)   . Anxiety   . Mental disorder   . Esophageal reflux   . Obesity   . Depression   . Migraines   . H/O gestational diabetes mellitus, not currently pregnant   . OSA (obstructive sleep apnea)     Mild w AHI 14/hr now on CPAP at 9cm H2O  . Hypertension   . Diabetes mellitus without complication (HCC)   . Bradycardia 09/26/2014   Past Surgical History  Procedure Laterality Date  . Abdominal hysterectomy    . Carpel tunnel surgery      x 2 - left and right  . Trigger thumb release      x 2 for right and left thumbs  . Wisdom tooth  extraction    . Left foot surgery      remove cyst  . Cesarean section  1987    x 1  . Robotic assisted salpingo oopherectomy Left 08/08/2012    Procedure: ROBOTIC ASSISTED SALPINGO OOPHORECTOMY;  Surgeon: Serita Kyle, MD;  Location: [facility];  Service: Gynecology;  Laterality: Left;  . Robotic assisted laparoscopic lysis of adhesion N/A 08/08/2012    Procedure: ROBOTIC ASSISTED LAPAROSCOPIC exctensive LYSIS OF ADHESION;  Surgeon: Serita Kyle, MD;  Location: [facility];  Service: Gynecology;  Laterality: N/A;  . Cystoscopy N/A 08/08/2012    Procedure: CYSTOSCOPY;  Surgeon: Serita Kyle, MD;  Location: [facility];  Service: Gynecology;  Laterality: N/A;   Family History  Problem Relation Age of Onset  . Diabetes Mellitus II Mother   . Colon cancer Maternal Uncle   . Colon cancer Paternal Uncle   . Diabetes Maternal Grandmother   . CAD Maternal Grandmother   . CAD Maternal Grandfather    Social History  Substance Use Topics  . Smoking status: Never Smoker   . Smokeless tobacco: Never Used  . Alcohol Use: No   OB History    No data available     Review of Systems  Constitutional: Negative for fever and chills.  HENT: Negative for sore throat.   Eyes: Negative for redness.  Respiratory: Negative for cough and shortness of breath.   Cardiovascular: Negative for chest pain.  Gastrointestinal: Positive for abdominal pain. Negative for vomiting.  Genitourinary: Positive for flank pain. Negative for dysuria, vaginal bleeding and vaginal discharge.  Musculoskeletal: Positive for back pain. Negative for neck pain.  Skin: Negative for rash.  Neurological: Negative for headaches.  Hematological: Does not bruise/bleed easily.  Psychiatric/Behavioral: Negative for confusion.      Allergies  Iodine  Home Medications   Prior to Admission medications   Medication Sig Start Date End Date Taking? Authorizing Provider  albuterol (PROVENTIL HFA;VENTOLIN HFA) 108 (90  BASE) MCG/ACT inhaler Inhale 2 puffs into the lungs every 6 (six) hours as needed for wheezing or shortness of breath.    Historical Provider, MD  albuterol (PROVENTIL) (2.5 MG/3ML) 0.083% nebulizer solution Take 2.5 mg by nebulization every 6 (six) hours as needed for wheezing or shortness of breath.    Historical Provider, MD  ALPRAZolam Prudy Feeler) 0.5 MG tablet Take 0.5 mg by mouth at bedtime as needed for sleep or anxiety.    Historical Provider, MD  aspirin 81 MG tablet Take 81 mg by mouth daily.    Historical Provider, MD  B-D ULTRAFINE III SHORT PEN 31G X 8 MM MISC See admin instructions. 09/19/14   Historical Provider, MD  budesonide-formoterol (SYMBICORT) 160-4.5 MCG/ACT inhaler Inhale 2 puffs into the lungs 2 (two) times daily.    Historical Provider, MD  cyclobenzaprine (FLEXERIL) 10 MG tablet Take 10 mg by mouth 3 (three) times daily as needed for muscle spasms.    Historical Provider, MD  DULoxetine (CYMBALTA) 60 MG capsule Take 60 mg by mouth daily.    Historical Provider, MD  EPINEPHrine (EPIPEN 2-PAK) 0.3 mg/0.3 mL IJ SOAJ injection as needed. 10/25/12   Historical Provider, MD  esomeprazole (NEXIUM) 40 MG capsule Take 1 capsule (40 mg total) by mouth 2 (two) times daily before a meal. 02/11/15   Quintella Reichert, MD  estradiol (CLIMARA - DOSED IN MG/24 HR) 0.1 mg/24hr patch Place 0.1 mg onto the skin once a week.    Historical Provider, MD  folic acid (FOLVITE) 1 MG tablet Take 1 mg by mouth daily.  07/24/14   Historical Provider, MD  gabapentin (NEURONTIN) 300 MG capsule Take 300 mg by mouth 3 (three) times daily.    Historical Provider, MD  insulin glargine (LANTUS) 100 UNIT/ML injection Inject 30 Units into the skin at bedtime.    Historical Provider, MD  ipratropium (ATROVENT) 0.02 % nebulizer solution Take 2.5 mLs (0.5 mg total) by nebulization 4 (four) times daily. 04/12/14   Arby Barrette, MD  levocetirizine (XYZAL) 5 MG tablet Take 5 mg by mouth every evening. 09/08/14   Historical  Provider, MD  losartan (COZAAR) 50 MG tablet Take 50 mg by mouth daily.  07/24/14   Historical Provider, MD  meclizine (ANTIVERT) 25 MG tablet Take 25 mg by mouth 2 (two) times daily as needed for dizziness.    Historical Provider, MD  meloxicam (MOBIC) 15 MG tablet Take 15 mg by mouth daily as needed for pain.    Historical Provider, MD  metFORMIN (GLUCOPHAGE-XR) 500 MG 24 hr tablet Take 1,000 mg by mouth daily. 03/11/15   Historical Provider, MD  methotrexate (RHEUMATREX) 2.5 MG tablet Take 10 mg by mouth once a week. On Wednesday. Caution:Chemotherapy. Protect from light.    Historical Provider,  MD  montelukast (SINGULAIR) 10 MG tablet Take 10 mg by mouth at bedtime.    Historical Provider, MD  naproxen (NAPROSYN) 500 MG tablet Take 500 mg by mouth 2 (two) times daily with a meal.  09/06/14   Historical Provider, MD  SUMAtriptan (IMITREX) 100 MG tablet Take 100 mg by mouth every 2 (two) hours as needed for migraine.    Historical Provider, MD  temazepam (RESTORIL) 15 MG capsule Take 15 mg by mouth at bedtime.    Historical Provider, MD  topiramate (TOPAMAX) 25 MG tablet Take 75 mg by mouth at bedtime.    Historical Provider, MD  Vitamin D, Ergocalciferol, (DRISDOL) 50000 UNITS CAPS Take 50,000 Units by mouth 3 (three) times a week. Mondays, wednesdays, fridays    Historical Provider, MD   BP 142/94 mmHg  Pulse 60  Temp(Src) 98.4 F (36.9 C) (Oral)  Resp 18  SpO2 99% Physical Exam  Constitutional: She appears well-developed and well-nourished. No distress.  Eyes: Conjunctivae are normal. No scleral icterus.  Neck: Neck supple. No tracheal deviation present.  Cardiovascular: Normal rate, regular rhythm, normal heart sounds and intact distal pulses.   Pulmonary/Chest: Effort normal and breath sounds normal. No respiratory distress.  Abdominal: Soft. Normal appearance and bowel sounds are normal. She exhibits no distension and no mass. There is no tenderness. There is no rebound and no guarding.   obese  Genitourinary:  No cva tenderness  Musculoskeletal: She exhibits no edema or tenderness.  Neurological: She is alert.  Skin: Skin is warm and dry. No rash noted. She is not diaphoretic.  Psychiatric: She has a normal mood and affect.  Nursing note and vitals reviewed.   ED Course  Procedures (including critical care time) Labs Review  Results for orders placed or performed during the hospital encounter of 06/10/15  Lipase, blood  Result Value Ref Range   Lipase 39 11 - 51 U/L  Comprehensive metabolic panel  Result Value Ref Range   Sodium 145 135 - 145 mmol/L   Potassium 3.9 3.5 - 5.1 mmol/L   Chloride 103 101 - 111 mmol/L   CO2 29 22 - 32 mmol/L   Glucose, Bld 90 65 - 99 mg/dL   BUN 10 6 - 20 mg/dL   Creatinine, Ser 9.560.80 0.44 - 1.00 mg/dL   Calcium 9.2 8.9 - 21.310.3 mg/dL   Total Protein 8.1 6.5 - 8.1 g/dL   Albumin 3.6 3.5 - 5.0 g/dL   AST 56 (H) 15 - 41 U/L   ALT 34 14 - 54 U/L   Alkaline Phosphatase 108 38 - 126 U/L   Total Bilirubin 0.7 0.3 - 1.2 mg/dL   GFR calc non Af Amer >60 >60 mL/min   GFR calc Af Amer >60 >60 mL/min   Anion gap 13 5 - 15  CBC  Result Value Ref Range   WBC 5.4 4.0 - 10.5 K/uL   RBC 4.05 3.87 - 5.11 MIL/uL   Hemoglobin 12.1 12.0 - 15.0 g/dL   HCT 08.638.1 57.836.0 - 46.946.0 %   MCV 94.1 78.0 - 100.0 fL   MCH 29.9 26.0 - 34.0 pg   MCHC 31.8 30.0 - 36.0 g/dL   RDW 62.913.0 52.811.5 - 41.315.5 %   Platelets 201 150 - 400 K/uL   Ct Abdomen Pelvis Wo Contrast  06/10/2015  CLINICAL DATA:  Right flank pain EXAM: CT ABDOMEN AND PELVIS WITHOUT CONTRAST TECHNIQUE: Multidetector CT imaging of the abdomen and pelvis was performed following the standard protocol without  IV contrast. COMPARISON:  None. FINDINGS: Calcified granuloma in the right lower lobe. Layering gallstones are present towards the neck of the gallbladder. Spleen, pancreas, adrenal glands are within normal limits. 10 mm hypodensity is present at the dome of the left lobe of the liver. See image 9. The  appendix is within normal limits. Terminal ileum is within normal limits. Uterus is absent.  Adnexa are within normal limits. Bladder is unremarkable. No free-fluid.  No abnormal retroperitoneal adenopathy. No focal bowel wall thickening or evidence of bowel obstruction. No vertebral compression deformity. IMPRESSION: Cholelithiasis 10 mm hypodensity at the left dome of the liver is nonspecific. Of the patient has a history of malignancy or is at high risk for malignancy, MRI is recommended to further characterize. No acute intra-abdominal pathology. No evidence of urinary obstruction or appendicitis. Electronically Signed   By: Jolaine Click M.D.   On: 06/10/2015 20:33       I have personally reviewed and evaluated these images and lab results as part of my medical decision-making.    MDM   Pt requests pain med. States has ride, does not have to drive.  Ct.   Dilaudid 1 mg im.  Reviewed nursing notes and prior charts for additional history.   pts pain would be very atypical for gallstones, right lower flank. No abd tenderness on exam.  Discussed ct w pt.  Also discussed diff dx including musculoskeletal strain, nerve impingement, biliary colic/gallstones, etc.  Pt requests additional med, is also tearful and anxious appearing.   Ativan 1 mg. Motrin po.   abd remains soft nt.  Pt currently appears stable for d/c.       Cathren Laine, MD 06/10/15 2050

## 2015-06-10 NOTE — Discharge Instructions (Signed)
It was our pleasure to provide your ER care today - we hope that you feel better.  Your ct scan was read as showing: IMPRESSION: Cholelithiasis (gallstones)  10 mm hypodensity at the left dome of the liver is nonspecific. Of the patient has a history of malignancy or is at high risk for malignancy, MRI is recommended to further characterize.  No acute intra-abdominal pathology. No evidence of urinary obstruction or appendicitis.  For gallstones, follow up with general surgeon in the next 1-2 weeks - call office to arrange appointment.  For the 1 cm lesions noted by the liver, discuss with primary care doctor in the next few weeks, and discuss repeat imaging, possible MRI scan, with them.  Take motrin or aleve as need for pain. You may also take hydrocodone as need for pain. No driving when taking hydrocodone. Also, do not take tylenol or acetaminophen containing medication when taking hydrocodone.  Return to ER if worse, new symptoms, fevers, severe abdominal pain, other concern.  You were given pain medication in the ER - no driving for the next 4 hours.    Flank Pain Flank pain refers to pain that is located on the side of the body between the upper abdomen and the back. The pain may occur over a short period of time (acute) or may be long-term or reoccurring (chronic). It may be mild or severe. Flank pain can be caused by many things. CAUSES  Some of the more common causes of flank pain include:  Muscle strains.   Muscle spasms.   A disease of your spine (vertebral disk disease).   A lung infection (pneumonia).   Fluid around your lungs (pulmonary edema).   A kidney infection.   Kidney stones.   A very painful skin rash caused by the chickenpox virus (shingles).   Gallbladder disease.  HOME CARE INSTRUCTIONS  Home care will depend on the cause of your pain. In general,  Rest as directed by your caregiver.  Drink enough fluids to keep your urine clear or  pale yellow.  Only take over-the-counter or prescription medicines as directed by your caregiver. Some medicines may help relieve the pain.  Tell your caregiver about any changes in your pain.  Follow up with your caregiver as directed. SEEK IMMEDIATE MEDICAL CARE IF:   Your pain is not controlled with medicine.   You have new or worsening symptoms.  Your pain increases.   You have abdominal pain.   You have shortness of breath.   You have persistent nausea or vomiting.   You have swelling in your abdomen.   You feel faint or pass out.   You have blood in your urine.  You have a fever or persistent symptoms for more than 2-3 days.  You have a fever and your symptoms suddenly get worse. MAKE SURE YOU:   Understand these instructions.  Will watch your condition.  Will get help right away if you are not doing well or get worse.   This information is not intended to replace advice given to you by your health care provider. Make sure you discuss any questions you have with your health care provider.   Document Released: 05/07/2005 Document Revised: 12/09/2011 Document Reviewed: 10/29/2011 Elsevier Interactive Patient Education 2016 Elsevier Inc.    Back Pain, Adult Back pain is very common in adults.The cause of back pain is rarely dangerous and the pain often gets better over time.The cause of your back pain may not be known. Some common  causes of back pain include:  Strain of the muscles or ligaments supporting the spine.  Wear and tear (degeneration) of the spinal disks.  Arthritis.  Direct injury to the back. For many people, back pain may return. Since back pain is rarely dangerous, most people can learn to manage this condition on their own. HOME CARE INSTRUCTIONS Watch your back pain for any changes. The following actions may help to lessen any discomfort you are feeling:  Remain active. It is stressful on your back to sit or stand in one place  for long periods of time. Do not sit, drive, or stand in one place for more than 30 minutes at a time. Take short walks on even surfaces as soon as you are able.Try to increase the length of time you walk each day.  Exercise regularly as directed by your health care provider. Exercise helps your back heal faster. It also helps avoid future injury by keeping your muscles strong and flexible.  Do not stay in bed.Resting more than 1-2 days can delay your recovery.  Pay attention to your body when you bend and lift. The most comfortable positions are those that put less stress on your recovering back. Always use proper lifting techniques, including:  Bending your knees.  Keeping the load close to your body.  Avoiding twisting.  Find a comfortable position to sleep. Use a firm mattress and lie on your side with your knees slightly bent. If you lie on your back, put a pillow under your knees.  Avoid feeling anxious or stressed.Stress increases muscle tension and can worsen back pain.It is important to recognize when you are anxious or stressed and learn ways to manage it, such as with exercise.  Take medicines only as directed by your health care provider. Over-the-counter medicines to reduce pain and inflammation are often the most helpful.Your health care provider may prescribe muscle relaxant drugs.These medicines help dull your pain so you can more quickly return to your normal activities and healthy exercise.  Apply ice to the injured area:  Put ice in a plastic bag.  Place a towel between your skin and the bag.  Leave the ice on for 20 minutes, 2-3 times a day for the first 2-3 days. After that, ice and heat may be alternated to reduce pain and spasms.  Maintain a healthy weight. Excess weight puts extra stress on your back and makes it difficult to maintain good posture. SEEK MEDICAL CARE IF:  You have pain that is not relieved with rest or medicine.  You have increasing pain  going down into the legs or buttocks.  You have pain that does not improve in one week.  You have night pain.  You lose weight.  You have a fever or chills. SEEK IMMEDIATE MEDICAL CARE IF:   You develop new bowel or bladder control problems.  You have unusual weakness or numbness in your arms or legs.  You develop nausea or vomiting.  You develop abdominal pain.  You feel faint.   This information is not intended to replace advice given to you by your health care provider. Make sure you discuss any questions you have with your health care provider.   Document Released: 03/16/2005 Document Revised: 04/06/2014 Document Reviewed: 07/18/2013 Elsevier Interactive Patient Education 2016 [employer].    Cholelithiasis Cholelithiasis (also called gallstones) is a form of gallbladder disease in which gallstones form in your gallbladder. The gallbladder is an organ that stores bile made in the liver, which  helps digest fats. Gallstones begin as small crystals and slowly grow into stones. Gallstone pain occurs when the gallbladder spasms and a gallstone is blocking the duct. Pain can also occur when a stone passes out of the duct.  RISK FACTORS  Being female.   Having multiple pregnancies. Health care providers sometimes advise removing diseased gallbladders before future pregnancies.   Being obese.  Eating a diet heavy in fried foods and fat.   Being older than 60 years and increasing age.   Prolonged use of medicines containing female hormones.   Having diabetes mellitus.   Rapidly losing weight.   Having a family history of gallstones (heredity).  SYMPTOMS  Nausea.   Vomiting.  Abdominal pain.   Yellowing of the skin (jaundice).   Sudden pain. It may persist from several minutes to several hours.  Fever.   Tenderness to the touch. In some cases, when gallstones do not move into the bile duct, people have no pain or symptoms. These are called  "silent" gallstones.  TREATMENT Silent gallstones do not need treatment. In severe cases, emergency surgery may be required. Options for treatment include:  Surgery to remove the gallbladder. This is the most common treatment.  Medicines. These do not always work and may take 6-12 months or more to work.  Shock wave treatment (extracorporeal biliary lithotripsy). In this treatment an ultrasound machine sends shock waves to the gallbladder to break gallstones into smaller pieces that can pass into the intestines or be dissolved by medicine. HOME CARE INSTRUCTIONS   Only take over-the-counter or prescription medicines for pain, discomfort, or fever as directed by your health care provider.   Follow a low-fat diet until seen again by your health care provider. Fat causes the gallbladder to contract, which can result in pain.   Follow up with your health care provider as directed. Attacks are almost always recurrent and surgery is usually required for permanent treatment.  SEEK IMMEDIATE MEDICAL CARE IF:   Your pain increases and is not controlled by medicines.   You have a fever or persistent symptoms for more than 2-3 days.   You have a fever and your symptoms suddenly get worse.   You have persistent nausea and vomiting.  MAKE SURE YOU:   Understand these instructions.  Will watch your condition.  Will get help right away if you are not doing well or get worse.   This information is not intended to replace advice given to you by your health care provider. Make sure you discuss any questions you have with your health care provider.   Document Released: 03/12/2005 Document Revised: 11/16/2012 Document Reviewed: 09/07/2012 Elsevier Interactive Patient Education [employer]2016 Elsevier Inc.

## 2015-06-10 NOTE — ED Notes (Signed)
Patient vomited small amount.  Requesting to go home

## 2015-06-13 ENCOUNTER — Other Ambulatory Visit: Payer: Self-pay | Admitting: Family Medicine

## 2015-06-13 DIAGNOSIS — K769 Liver disease, unspecified: Secondary | ICD-10-CM

## 2015-06-14 ENCOUNTER — Other Ambulatory Visit: Payer: 59

## 2015-06-22 ENCOUNTER — Inpatient Hospital Stay: Admission: RE | Admit: 2015-06-22 | Payer: 59 | Source: Ambulatory Visit

## 2015-10-05 IMAGING — CR DG CHEST 2V
2 series · 2 of 2 positions shown · non-contrast
Comparison: Chest x-ray of 04/12/2014

CLINICAL DATA: Right-sided chest pain, difficulty breathing

EXAM:
CHEST  2 VIEW

[w chest pa *]
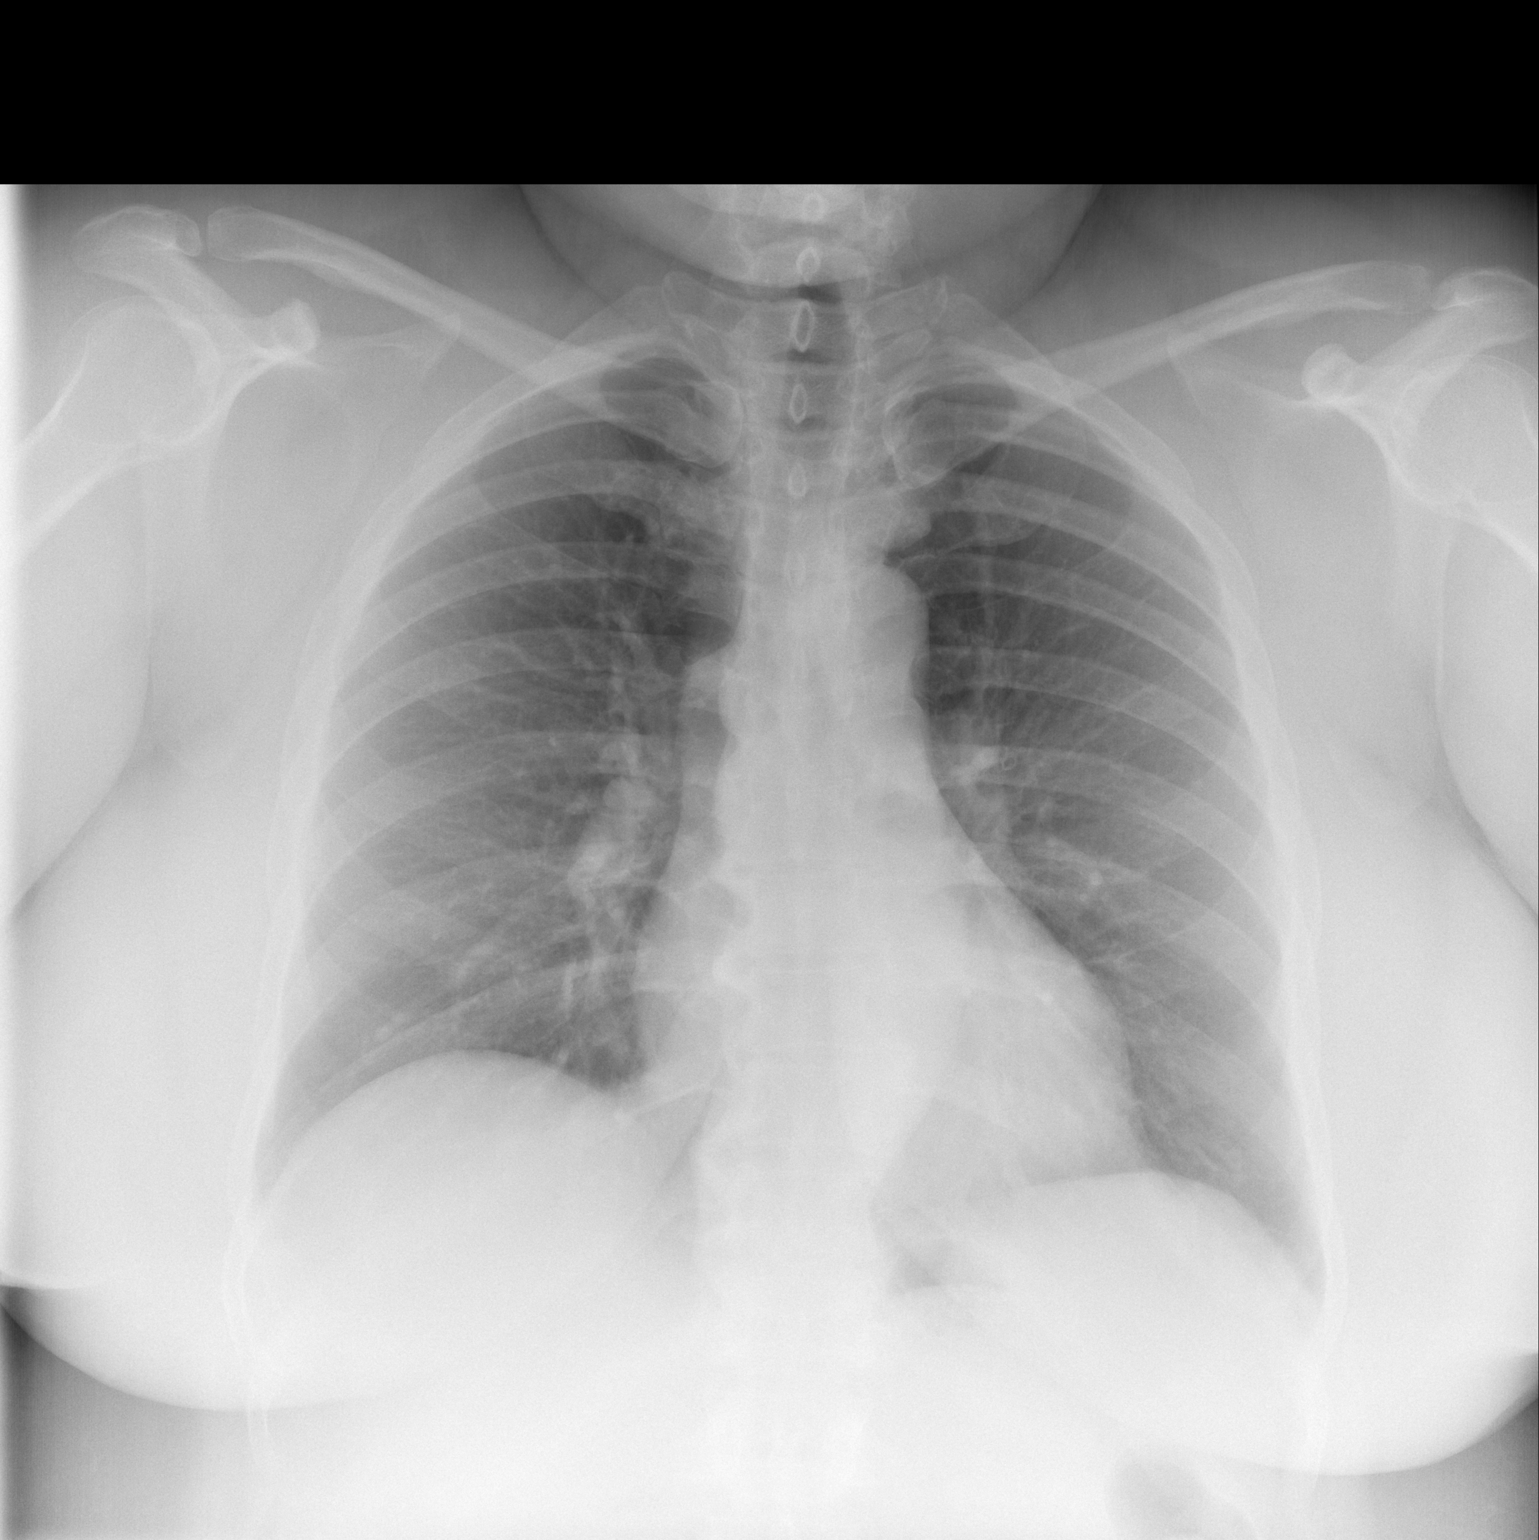

[w chest lat *]
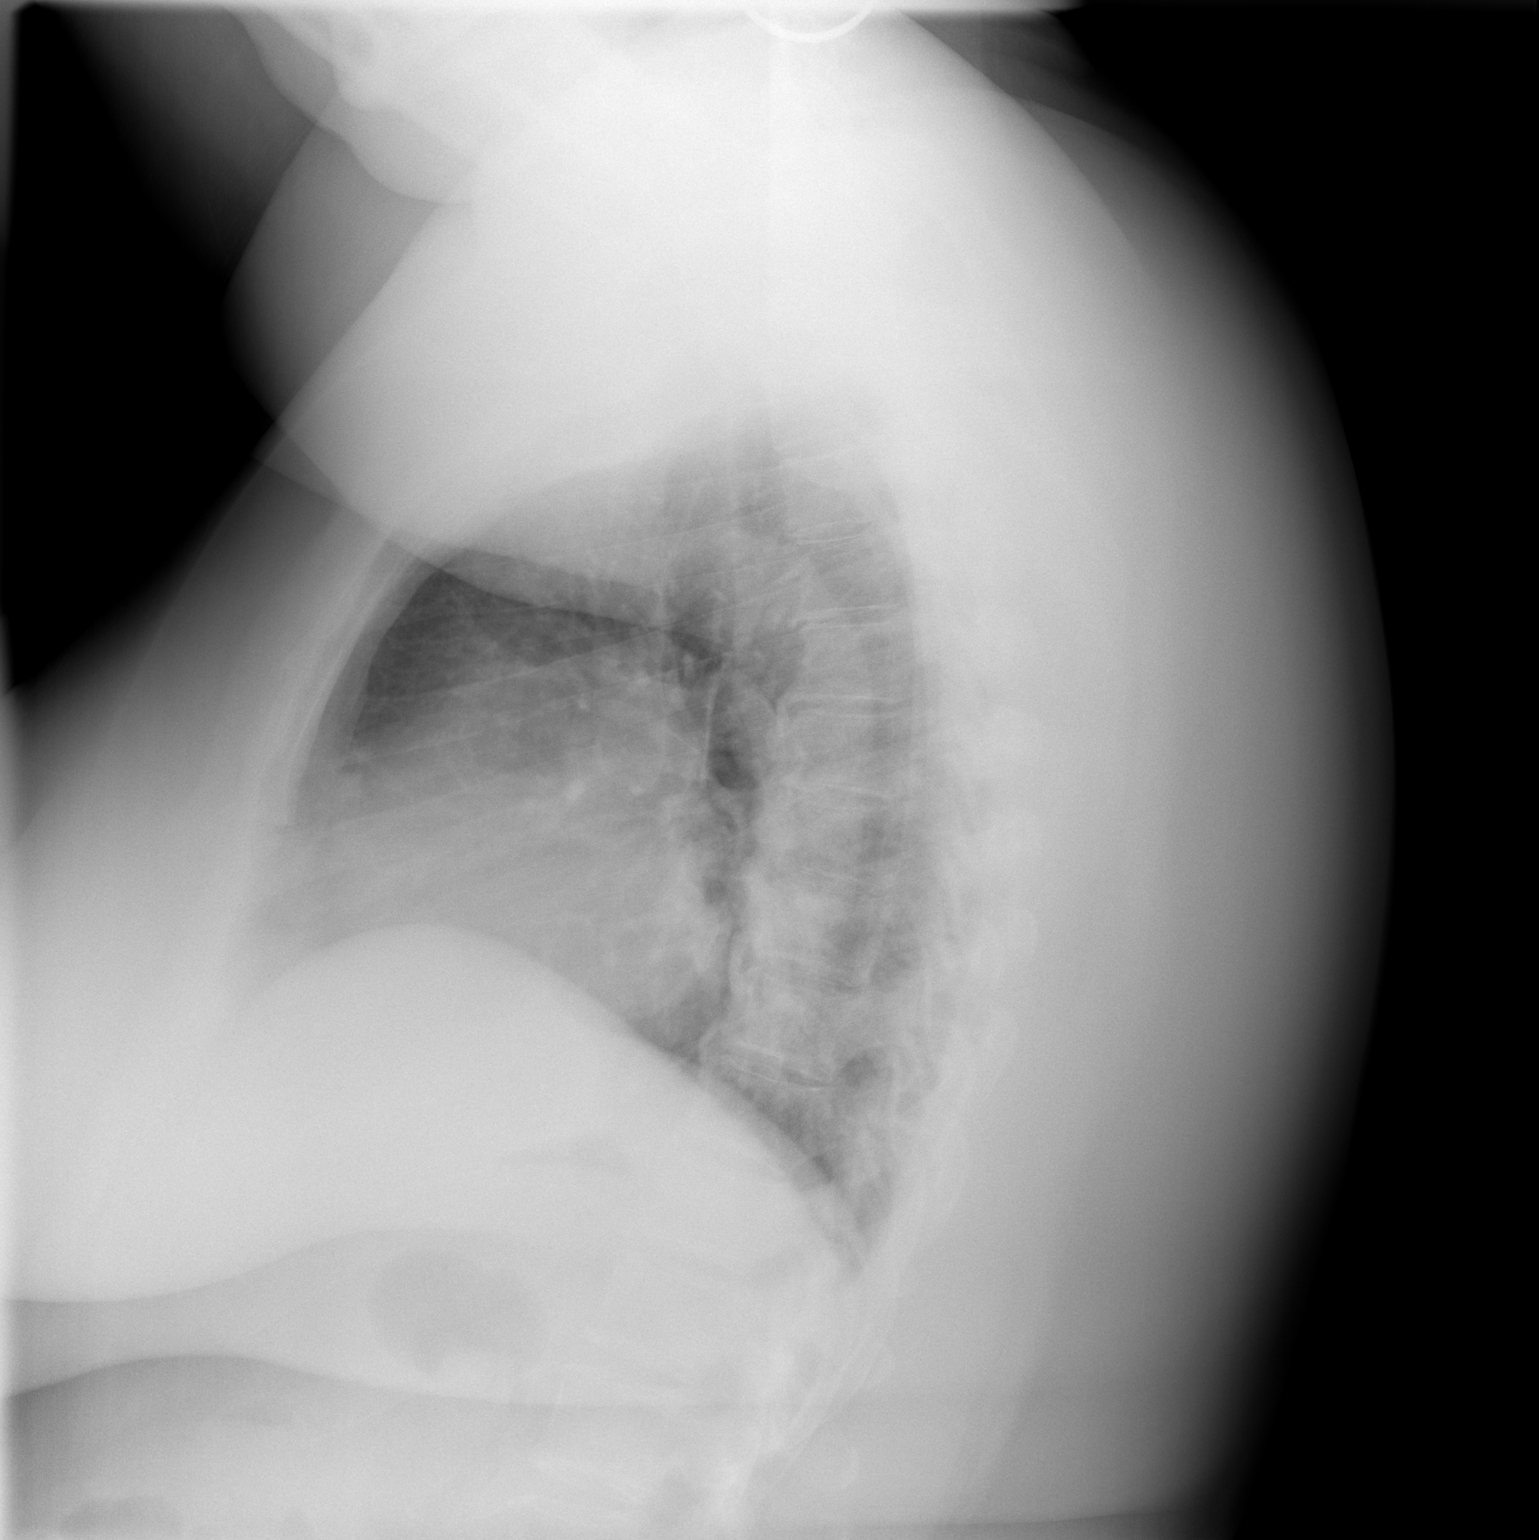

[2 of 2 positions shown; findings below may reference images not displayed]

FINDINGS: No active infiltrate or effusion is seen. No pneumothorax is noted.
Mediastinal and hilar contours are unremarkable. The heart is within
normal limits in size. There are mild degenerative changes in the
mid to lower thoracic spine.
IMPRESSION: No active cardiopulmonary disease.

## 2015-12-04 ENCOUNTER — Other Ambulatory Visit: Payer: Self-pay | Admitting: Family Medicine

## 2015-12-04 DIAGNOSIS — Z1231 Encounter for screening mammogram for malignant neoplasm of breast: Secondary | ICD-10-CM

## 2015-12-10 ENCOUNTER — Ambulatory Visit (INDEPENDENT_AMBULATORY_CARE_PROVIDER_SITE_OTHER): Payer: 59 | Admitting: Cardiology

## 2015-12-10 ENCOUNTER — Encounter: Payer: Self-pay | Admitting: Cardiology

## 2015-12-10 VITALS — BP 124/72 | HR 70 | Ht 64.0 in | Wt 285.8 lb

## 2015-12-10 DIAGNOSIS — R002 Palpitations: Secondary | ICD-10-CM

## 2015-12-10 NOTE — Progress Notes (Signed)
Cardiology Office Note    Date:  12/10/2015   ID:  [name] [name], DOB 11/20/1965, MRN [id number]  PCP:  Hollice EspyGATES,[name] RUTH, MD  Cardiologist:  [name] Magicraci Danyella Mcginty, MD   Chief Complaint  Patient presents with  . Sleep Apnea  . Hypertension    History of Present Illness:  [name] [name] is a 50 y.o. female with a history of OSA, obesity and HTN presents today for followup. She is doing well. She tolerates her CPAP without any problems. For the most part she feels rested for the most part when she gets up. She tolerates her full face mask and feels the pressure is adequate.  She would like to try to change to a nasal pillow mask. She does not snore.   She has no mouth dryness and no head congestion.     Past Medical History:  Diagnosis Date  . Anemia    history  . Anxiety   . Arthritis    lower back, knee  . Asthma   . Bradycardia 09/26/2014  . Depression   . Diabetes mellitus without complication (HCC)   . Esophageal reflux   . GERD (gastroesophageal reflux disease)   . H/O gestational diabetes mellitus, not currently pregnant   . Headache(784.0)    on rx med  . Hyperlipidemia    diet controlled  . Hypertension   . Mental disorder   . Migraines   . Obesity   . OSA (obstructive sleep apnea)    Mild w AHI 14/hr now on CPAP at 9cm H2O  . PONV (postoperative nausea and vomiting)   . Seasonal allergies   . Sleep apnea    cpap setting 70  . SVD (spontaneous vaginal delivery)    x 2    Past Surgical History:  Procedure Laterality Date  . ABDOMINAL HYSTERECTOMY    . carpel tunnel surgery     x 2 - left and right  . CESAREAN SECTION  1987   x 1  . CYSTOSCOPY N/A 08/08/2012   Procedure: CYSTOSCOPY;  Surgeon: Serita KyleSheronette A Cousins, MD;  Location: [facility];  Service: Gynecology;  Laterality: N/A;  . left foot surgery     remove cyst  . ROBOTIC ASSISTED LAPAROSCOPIC LYSIS OF ADHESION N/A 08/08/2012   Procedure: ROBOTIC ASSISTED LAPAROSCOPIC  exctensive LYSIS OF ADHESION;  Surgeon: Serita KyleSheronette A Cousins, MD;  Location: [facility];  Service: Gynecology;  Laterality: N/A;  . ROBOTIC ASSISTED SALPINGO OOPHERECTOMY Left 08/08/2012   Procedure: ROBOTIC ASSISTED SALPINGO OOPHORECTOMY;  Surgeon: Serita KyleSheronette A Cousins, MD;  Location: [facility];  Service: Gynecology;  Laterality: Left;  . trigger thumb release     x 2 for right and left thumbs  . WISDOM TOOTH EXTRACTION      Current Medications: Outpatient Medications Prior to Visit  Medication Sig Dispense Refill  . albuterol (PROVENTIL HFA;VENTOLIN HFA) 108 (90 BASE) MCG/ACT inhaler Inhale 2 puffs into the lungs every 6 (six) hours as needed for wheezing or shortness of breath.    Marland Kitchen. albuterol (PROVENTIL) (2.5 MG/3ML) 0.083% nebulizer solution Take 2.5 mg by nebulization every 6 (six) hours as needed for wheezing or shortness of breath.    . ALPRAZolam (XANAX) 0.5 MG tablet Take 0.5 mg by mouth at bedtime as needed for sleep or anxiety.    Marland Kitchen. aspirin 81 MG tablet Take 81 mg by mouth daily.    . B-D ULTRAFINE III SHORT PEN 31G X 8 MM MISC See admin instructions.  0  . budesonide-formoterol (SYMBICORT)  160-4.5 MCG/ACT inhaler Inhale 2 puffs into the lungs 2 (two) times daily.    . cyclobenzaprine (FLEXERIL) 10 MG tablet Take 10 mg by mouth 3 (three) times daily as needed for muscle spasms.    . DULoxetine (CYMBALTA) 60 MG capsule Take 60 mg by mouth daily.    Marland Kitchen EPINEPHrine (EPIPEN 2-PAK) 0.3 mg/0.3 mL IJ SOAJ injection as needed.    Marland Kitchen esomeprazole (NEXIUM) 40 MG capsule Take 1 capsule (40 mg total) by mouth 2 (two) times daily before a meal. 60 capsule 11  . estradiol (CLIMARA - DOSED IN MG/24 HR) 0.1 mg/24hr patch Place 0.1 mg onto the skin once a week.    . folic acid (FOLVITE) 1 MG tablet Take 1 mg by mouth daily.     Marland Kitchen gabapentin (NEURONTIN) 300 MG capsule Take 300 mg by mouth 3 (three) times daily.    Marland Kitchen HYDROcodone-acetaminophen (NORCO/VICODIN) 5-325 MG tablet Take 1-2 tablets by mouth every 6  (six) hours as needed for moderate pain. 20 tablet 0  . insulin glargine (LANTUS) 100 UNIT/ML injection Inject 30 Units into the skin at bedtime.    Marland Kitchen ipratropium (ATROVENT) 0.02 % nebulizer solution Take 2.5 mLs (0.5 mg total) by nebulization 4 (four) times daily. 75 mL 12  . levocetirizine (XYZAL) 5 MG tablet Take 5 mg by mouth every evening.  1  . losartan (COZAAR) 50 MG tablet Take 50 mg by mouth daily.     . meclizine (ANTIVERT) 25 MG tablet Take 25 mg by mouth 2 (two) times daily as needed for dizziness.    . meloxicam (MOBIC) 15 MG tablet Take 15 mg by mouth daily as needed for pain.    . metFORMIN (GLUCOPHAGE-XR) 500 MG 24 hr tablet Take 1,000 mg by mouth daily.    . methotrexate (RHEUMATREX) 2.5 MG tablet Take 10 mg by mouth once a week. On Wednesday. Caution:Chemotherapy. Protect from light.    . montelukast (SINGULAIR) 10 MG tablet Take 10 mg by mouth at bedtime.    . naproxen (NAPROSYN) 500 MG tablet Take 500 mg by mouth 2 (two) times daily with a meal.     . SUMAtriptan (IMITREX) 100 MG tablet Take 100 mg by mouth every 2 (two) hours as needed for migraine.    . temazepam (RESTORIL) 15 MG capsule Take 15 mg by mouth at bedtime.    . topiramate (TOPAMAX) 25 MG tablet Take 75 mg by mouth at bedtime.    . Vitamin D, Ergocalciferol, (DRISDOL) 50000 UNITS CAPS Take 50,000 Units by mouth 3 (three) times a week. Mondays, wednesdays, fridays     No facility-administered medications prior to visit.      Allergies:   Iodine   Social History   Social History  . Marital status: Married    Spouse name: N/A  . Number of children: N/A  . Years of education: N/A   Social History Main Topics  . Smoking status: Never Smoker  . Smokeless tobacco: Never Used  . Alcohol use No  . Drug use: No  . Sexual activity: Yes    Birth control/ protection: Other-see comments     Comment: hysterectomy   Other Topics Concern  . None   Social History Narrative  . None     Family History:  The  patient's family history includes CAD in her maternal grandfather and maternal grandmother; Colon cancer in her maternal uncle and paternal uncle; Diabetes in her maternal grandmother; Diabetes Mellitus II in her mother.  ROS:   Please see the history of present illness.    ROS All other systems reviewed and are negative.   PHYSICAL EXAM:   VS:  BP 124/72   Pulse 70   Ht 5\' 4"  (1.626 m)   Wt 285 lb 12.8 oz (129.6 kg)   SpO2 98%   BMI 49.06 kg/m    GEN: Well nourished, well developed, in no acute distress  HEENT: normal  Neck: no JVD, carotid bruits, or masses Cardiac: RRR; no murmurs, rubs, or gallops,no edema.  Intact distal pulses bilaterally.  Respiratory:  clear to auscultation bilaterally, normal work of breathing GI: soft, nontender, nondistended, + BS MS: no deformity or atrophy  Skin: warm and dry, no rash Neuro:  Alert and Oriented x 3, Strength and sensation are intact Psych: euthymic mood, full affect  Wt Readings from Last 3 Encounters:  12/10/15 285 lb 12.8 oz (129.6 kg)  03/18/15 290 lb 12.8 oz (131.9 kg)  02/04/15 285 lb (129.3 kg)      Studies/Labs Reviewed:   EKG:  EKG is  ordered today.  The ekg ordered today demonstrates sinus bradycardia at 56bpm with no ST changes  Recent Labs: 02/11/2015: Brain Natriuretic Peptide 3.3 06/10/2015: ALT 34; BUN 10; Creatinine, Ser 0.80; Hemoglobin 12.1; Platelets 201; Potassium 3.9; Sodium 145   Lipid Panel No results found for: CHOL, TRIG, HDL, CHOLHDL, VLDL, LDLCALC, LDLDIRECT  Additional studies/ records that were reviewed today include:  none    ASSESSMENT:    No diagnosis found.   PLAN:  In order of problems listed above:  OSA - the patient is tolerating PAP therapy well without any problems. The patient has been using and benefiting from CPAP use and will continue to benefit from therapy. She will drop off her download card to check her compliance and AHI. Obesity - I have encouraged her to try to  exercise more and watch her carbs and portions. Bradycardia - she is asymptomatic. Palpitations - I have recommended that we get a heart monitor to rule out PAF given increased incidence in sleep apnea patients.      Medication Adjustments/Labs and Tests Ordered: Current medicines are reviewed at length with the patient today.  Concerns regarding medicines are outlined above.  Medication changes, Labs and Tests ordered today are listed in the Patient Instructions below.  There are no Patient Instructions on file for this visit.   Signed, [name] Magic, MD  12/10/2015 3:50 PM    [facility] [address] [place], [place], [place]  [address] Phone: [phone]; Fax: [phone]

## 2015-12-10 NOTE — Patient Instructions (Signed)
Medication Instructions:  Your physician recommends that you continue on your current medications as directed. Please refer to the Current Medication list given to you today.   Labwork: None  Testing/Procedures: Your physician has recommended that you wear an event monitor. Event monitors are medical devices that record the heart's electrical activity. Doctors most often us these monitors to diagnose arrhythmias. Arrhythmias are problems with the speed or rhythm of the heartbeat. The monitor is a small, portable device. You can wear one while you do your normal daily activities. This is usually used to diagnose what is causing palpitations/syncope (passing out).  Follow-Up: Your physician wants you to follow-up in: 1 year with Dr. Turner. You will receive a reminder letter in the mail two months in advance. If you don't receive a letter, please call our office to schedule the follow-up appointment.   Any Other Special Instructions Will Be Listed Below (If Applicable).     If you need a refill on your cardiac medications before your next appointment, please call your pharmacy.   

## 2015-12-13 ENCOUNTER — Ambulatory Visit (INDEPENDENT_AMBULATORY_CARE_PROVIDER_SITE_OTHER): Payer: 59

## 2015-12-13 DIAGNOSIS — R002 Palpitations: Secondary | ICD-10-CM | POA: Diagnosis not present

## 2015-12-18 ENCOUNTER — Telehealth: Payer: Self-pay | Admitting: Cardiology

## 2015-12-18 NOTE — Telephone Encounter (Signed)
New Message:     Her monitor is not working,its needs to be re-evaluated. Please call around 2:45, if not at 4:40,these are the times she will be on her break at work.

## 2015-12-18 NOTE — Telephone Encounter (Signed)
Spoke with patient and instructed her to call the monitor company for any issues she is having. They can easily troubleshoot.

## 2016-01-03 ENCOUNTER — Encounter: Payer: Self-pay | Admitting: *Deleted

## 2016-01-06 ENCOUNTER — Ambulatory Visit
Admission: RE | Admit: 2016-01-06 | Discharge: 2016-01-06 | Disposition: A | Discharge status: Home / Self Care | Payer: 59 | Source: Ambulatory Visit | Attending: Family Medicine | Admitting: Family Medicine

## 2016-01-06 DIAGNOSIS — Z1231 Encounter for screening mammogram for malignant neoplasm of breast: Secondary | ICD-10-CM

## 2016-08-10 ENCOUNTER — Other Ambulatory Visit: Payer: Self-pay | Admitting: Orthopedic Surgery

## 2016-08-10 DIAGNOSIS — M545 Low back pain, unspecified: Secondary | ICD-10-CM

## 2016-08-10 DIAGNOSIS — G8929 Other chronic pain: Secondary | ICD-10-CM

## 2016-08-16 ENCOUNTER — Ambulatory Visit
Admission: RE | Admit: 2016-08-16 | Discharge: 2016-08-16 | Disposition: A | Discharge status: Home / Self Care | Payer: 59 | Source: Ambulatory Visit | Attending: Orthopedic Surgery | Admitting: Orthopedic Surgery

## 2016-08-16 DIAGNOSIS — G8929 Other chronic pain: Secondary | ICD-10-CM

## 2016-08-16 DIAGNOSIS — M545 Low back pain, unspecified: Secondary | ICD-10-CM

## 2016-10-20 ENCOUNTER — Other Ambulatory Visit: Payer: Self-pay | Admitting: Orthopedic Surgery

## 2016-10-20 DIAGNOSIS — M5136 Other intervertebral disc degeneration, lumbar region: Secondary | ICD-10-CM

## 2016-10-30 ENCOUNTER — Ambulatory Visit
Admission: RE | Admit: 2016-10-30 | Discharge: 2016-10-30 | Disposition: A | Discharge status: Home / Self Care | Payer: 59 | Source: Ambulatory Visit | Attending: Orthopedic Surgery | Admitting: Orthopedic Surgery

## 2016-10-30 ENCOUNTER — Other Ambulatory Visit: Payer: Self-pay | Admitting: Orthopedic Surgery

## 2016-10-30 DIAGNOSIS — M5136 Other intervertebral disc degeneration, lumbar region: Secondary | ICD-10-CM

## 2016-10-30 MED ORDER — METHYLPREDNISOLONE ACETATE 40 MG/ML INJ SUSP (RADIOLOG: 120.0000 mg | Freq: Once | INTRAMUSCULAR | Status: DC

## 2016-10-30 NOTE — Discharge Instructions (Signed)

## 2016-12-14 ENCOUNTER — Other Ambulatory Visit: Payer: Self-pay | Admitting: Family Medicine

## 2016-12-14 DIAGNOSIS — Z1239 Encounter for other screening for malignant neoplasm of breast: Secondary | ICD-10-CM

## 2017-01-06 ENCOUNTER — Ambulatory Visit
Admission: RE | Admit: 2017-01-06 | Discharge: 2017-01-06 | Disposition: A | Discharge status: Home / Self Care | Payer: BLUE CROSS/BLUE SHIELD | Source: Ambulatory Visit | Attending: Family Medicine | Admitting: Family Medicine

## 2017-01-06 DIAGNOSIS — Z1239 Encounter for other screening for malignant neoplasm of breast: Secondary | ICD-10-CM

## 2017-03-15 ENCOUNTER — Ambulatory Visit
Admission: RE | Admit: 2017-03-15 | Discharge: 2017-03-15 | Disposition: A | Discharge status: Home / Self Care | Payer: BLUE CROSS/BLUE SHIELD | Source: Ambulatory Visit | Attending: Family Medicine | Admitting: Family Medicine

## 2017-03-15 ENCOUNTER — Other Ambulatory Visit: Payer: Self-pay | Admitting: Family Medicine

## 2017-03-15 DIAGNOSIS — W19XXXA Unspecified fall, initial encounter: Secondary | ICD-10-CM

## 2017-03-19 ENCOUNTER — Encounter (HOSPITAL_COMMUNITY): Payer: Self-pay

## 2017-03-19 ENCOUNTER — Emergency Department (HOSPITAL_COMMUNITY)
Admission: EM | Admit: 2017-03-19 | Discharge: 2017-03-19 | Disposition: A | Discharge status: Home / Self Care | Payer: BLUE CROSS/BLUE SHIELD | Attending: Emergency Medicine | Admitting: Emergency Medicine

## 2017-03-19 ENCOUNTER — Other Ambulatory Visit: Payer: Self-pay

## 2017-03-19 DIAGNOSIS — Z79899 Other long term (current) drug therapy: Secondary | ICD-10-CM | POA: Insufficient documentation

## 2017-03-19 DIAGNOSIS — J45909 Unspecified asthma, uncomplicated: Secondary | ICD-10-CM | POA: Diagnosis not present

## 2017-03-19 DIAGNOSIS — R202 Paresthesia of skin: Secondary | ICD-10-CM | POA: Diagnosis present

## 2017-03-19 DIAGNOSIS — I10 Essential (primary) hypertension: Secondary | ICD-10-CM | POA: Insufficient documentation

## 2017-03-19 DIAGNOSIS — Z7982 Long term (current) use of aspirin: Secondary | ICD-10-CM | POA: Insufficient documentation

## 2017-03-19 DIAGNOSIS — Z794 Long term (current) use of insulin: Secondary | ICD-10-CM | POA: Diagnosis not present

## 2017-03-19 DIAGNOSIS — T7840XA Allergy, unspecified, initial encounter: Secondary | ICD-10-CM

## 2017-03-19 DIAGNOSIS — T781XXA Other adverse food reactions, not elsewhere classified, initial encounter: Secondary | ICD-10-CM | POA: Insufficient documentation

## 2017-03-19 DIAGNOSIS — E119 Type 2 diabetes mellitus without complications: Secondary | ICD-10-CM | POA: Diagnosis not present

## 2017-03-19 MED ORDER — EPINEPHRINE 0.3 MG/0.3ML IJ SOAJ: 0.3000 mg | Freq: Once | INTRAMUSCULAR | 1 refills | Status: AC

## 2017-03-19 MED ORDER — DIPHENHYDRAMINE HCL 25 MG PO TABS: 50.0000 mg | ORAL_TABLET | Freq: Four times a day (QID) | ORAL | 0 refills | Status: DC | PRN

## 2017-03-19 MED ORDER — PREDNISONE 20 MG PO TABS: ORAL_TABLET | ORAL | 0 refills | Status: DC

## 2017-03-19 NOTE — ED Triage Notes (Signed)
Patient c/o roof of mouth itching, swelling and itching of the throat since 1400 Patient used her Albuterol inhaler and Benadryl 50 mg at 1430. Patient states the throat swelling has decreased. No problems swallowing or breathing at this time. 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 Patient ate fries and hush puppies from the seafood place.

## 2017-03-19 NOTE — ED Provider Notes (Signed)
[facility]-EMERGENCY DEPT Provider Note   CSN: [id number] Arrival date & time: 03/19/17  1520     History   Chief Complaint Chief Complaint  Patient presents with  . Allergic Reaction    HPI [name] [name] is a 51 y.o. female.  HPI Patient has seafood allergy.  She was eating [place] fries and hush puppies from a seafood restaurant when she started to feel tingling in her throat and tongue.  It seemed to progress to some tightness and difficulty with swallowing.  Patient took Benadryl around 2 PM.  She was getting some improvement but continued to have a feeling of tingling of the referral of her mouth and tongue.  She called her PCP and was referred to the emergency department.  Patient denies she had difficulty breathing, rash, vomiting or diarrhea.  Patient used to have an EpiPen but no longer has one.  She did not use that. Past Medical History:  Diagnosis Date  . Anemia    history  . Anxiety   . Arthritis    lower back, knee  . Asthma   . Bradycardia 09/26/2014  . Depression   . Diabetes mellitus without complication (HCC)   . Esophageal reflux   . GERD (gastroesophageal reflux disease)   . H/O gestational diabetes mellitus, not currently pregnant   . Headache(784.0)    on rx med  . Hyperlipidemia    diet controlled  . Hypertension   . Mental disorder   . Migraines   . Obesity   . OSA (obstructive sleep apnea)    Mild w AHI 14/hr now on CPAP at 9cm H2O  . PONV (postoperative nausea and vomiting)   . Seasonal allergies   . Sleep apnea    cpap setting 70  . SVD (spontaneous vaginal delivery)    x 2    Patient Active Problem List   Diagnosis Date Noted  . Bradycardia 09/26/2014  . Hypertension   . Obstructive sleep apnea 05/03/2013  . Obesity 05/27/2006  . ASTHMA, UNSPECIFIED 05/27/2006    Past Surgical History:  Procedure Laterality Date  . ABDOMINAL HYSTERECTOMY    . carpel tunnel surgery     x 2 - left and right    . CESAREAN SECTION  1987   x 1  . CYSTOSCOPY N/A 08/08/2012   Procedure: CYSTOSCOPY;  Surgeon: Serita Kyle, MD;  Location: [facility];  Service: Gynecology;  Laterality: N/A;  . left foot surgery     remove cyst  . ROBOTIC ASSISTED LAPAROSCOPIC LYSIS OF ADHESION N/A 08/08/2012   Procedure: ROBOTIC ASSISTED LAPAROSCOPIC exctensive LYSIS OF ADHESION;  Surgeon: Serita Kyle, MD;  Location: [facility];  Service: Gynecology;  Laterality: N/A;  . ROBOTIC ASSISTED SALPINGO OOPHERECTOMY Left 08/08/2012   Procedure: ROBOTIC ASSISTED SALPINGO OOPHORECTOMY;  Surgeon: Serita Kyle, MD;  Location: [facility];  Service: Gynecology;  Laterality: Left;  . trigger thumb release     x 2 for right and left thumbs  . WISDOM TOOTH EXTRACTION      OB History    No data available       Home Medications    Prior to Admission medications   Medication Sig Start Date End Date Taking? Authorizing Provider  albuterol (PROVENTIL HFA;VENTOLIN HFA) 108 (90 BASE) MCG/ACT inhaler Inhale 2 puffs into the lungs every 6 (six) hours as needed for wheezing or shortness of breath.   Yes [provider]  albuterol (PROVENTIL) (2.5 MG/3ML) 0.083% nebulizer solution Take  2.5 mg by nebulization every 6 (six) hours as needed for wheezing or shortness of breath.   Yes [provider]  aspirin 81 MG tablet Take 81 mg by mouth daily.   Yes [provider]  budesonide-formoterol (SYMBICORT) 160-4.5 MCG/ACT inhaler Inhale 2 puffs into the lungs 2 (two) times daily.   Yes [provider]  cyclobenzaprine (FLEXERIL) 10 MG tablet Take 10 mg by mouth 3 (three) times daily as needed for muscle spasms.   Yes [provider]  DULoxetine (CYMBALTA) 60 MG capsule Take 60 mg by mouth daily.   Yes [provider]  esomeprazole (NEXIUM) 40 MG capsule Take 1 capsule (40 mg total) by mouth 2 (two) times daily before a meal. 02/11/15  Yes Turner, Traci R, MD  estradiol (CLIMARA -  DOSED IN MG/24 HR) 0.1 mg/24hr patch Place 0.1 mg onto the skin once a week.   Yes [provider]  folic acid (FOLVITE) 1 MG tablet Take 1 mg by mouth daily.  07/24/14  Yes [provider]  gabapentin (NEURONTIN) 300 MG capsule Take 300 mg by mouth 3 (three) times daily.   Yes [provider]  insulin degludec (TRESIBA FLEXTOUCH) 100 UNIT/ML SOPN FlexTouch Pen Inject 29-30 Units into the skin daily at 10 pm.   Yes [provider]  ipratropium (ATROVENT) 0.02 % nebulizer solution Take 2.5 mLs (0.5 mg total) by nebulization 4 (four) times daily. Patient taking differently: Take 0.5 mg by nebulization every 6 (six) hours as needed for wheezing or shortness of breath.  04/12/14  Yes Arby BarrettePfeiffer, Amyria Komar, MD  levocetirizine (XYZAL) 5 MG tablet Take 5 mg by mouth every evening. 09/08/14  Yes [provider]  losartan (COZAAR) 50 MG tablet Take 50 mg by mouth daily.  07/24/14  Yes [provider]  metFORMIN (GLUCOPHAGE-XR) 500 MG 24 hr tablet Take 1,000 mg by mouth every evening.  03/11/15  Yes [provider]  montelukast (SINGULAIR) 10 MG tablet Take 10 mg by mouth at bedtime.   Yes [provider]  naproxen (NAPROSYN) 500 MG tablet Take 500 mg by mouth 2 (two) times daily as needed for moderate pain.  09/06/14  Yes [provider]  SUMAtriptan (IMITREX) 100 MG tablet Take 100 mg by mouth every 2 (two) hours as needed for migraine.   Yes [provider]  temazepam (RESTORIL) 15 MG capsule Take 15 mg by mouth at bedtime.   Yes [provider]  topiramate (TOPAMAX) 25 MG tablet Take 75 mg by mouth at bedtime.   Yes [provider]  Vitamin D, Ergocalciferol, (DRISDOL) 50000 UNITS CAPS Take 50,000 Units by mouth 3 (three) times a week. Mondays, wednesdays, fridays   Yes [provider]  ALPRAZolam Prudy Feeler(XANAX) 0.5 MG tablet Take 0.5 mg by mouth at bedtime as needed for sleep or anxiety.    [provider]  B-D ULTRAFINE III SHORT PEN 31G X 8 MM MISC See admin instructions. 09/19/14   [provider]  diphenhydrAMINE (BENADRYL) 25 MG tablet Take 2 tablets (50 mg total) by mouth every 6 (six) hours as needed for allergies. 03/19/17   Arby BarrettePfeiffer, Estha Few, MD  EPINEPHrine (EPIPEN 2-PAK) 0.3 mg/0.3 mL IJ SOAJ injection as needed. 10/25/12   [provider]  EPINEPHrine (EPIPEN 2-PAK) 0.3 mg/0.3 mL IJ SOAJ injection Inject 0.3 mLs (0.3 mg total) into the muscle once for 1 dose. 03/19/17 03/19/17  Arby BarrettePfeiffer, Cashtyn Pouliot, MD  HYDROcodone-acetaminophen (NORCO/VICODIN) 5-325 MG tablet Take 1-2 tablets by  mouth every 6 (six) hours as needed for moderate pain. Patient not taking: Reported on 03/19/2017 06/10/15   Cathren LaineSteinl, Kevin, MD  insulin glargine (LANTUS) 100 UNIT/ML injection Inject 30 Units into the skin at bedtime.    [provider]  meclizine (ANTIVERT) 25 MG tablet Take 25 mg by mouth 2 (two) times daily as needed for dizziness.    [provider]  meloxicam (MOBIC) 15 MG tablet Take 15 mg by mouth daily as needed for pain.    [provider]  methotrexate (RHEUMATREX) 2.5 MG tablet Take 10 mg by mouth once a week. On Wednesday. Caution:Chemotherapy. Protect from light.    [provider]  predniSONE (DELTASONE) 20 MG tablet 2 tabs po daily x 3 days 03/19/17   Arby BarrettePfeiffer, Dakwon Wenberg, MD    Family History Family History  Problem Relation Age of Onset  . Diabetes Mellitus II Mother   . Diabetes Maternal Grandmother   . CAD Maternal Grandmother   . CAD Maternal Grandfather   . Colon cancer Maternal Uncle   . Colon cancer Paternal Uncle   . Breast cancer Neg Hx     Social History Social History   Tobacco Use  . Smoking status: Never Smoker  . Smokeless tobacco: Never Used  Substance Use Topics  . Alcohol use: No  . Drug use: No     Allergies   Iodine   Review of Systems Review of Systems 10 Systems reviewed and are negative for acute  change except as noted in the HPI.  Physical Exam Updated Vital Signs BP (!) 142/83   Pulse (!) 57   Temp 98.7 F (37.1 C) (Oral)   Resp 16   Ht 5\' 4"  (1.626 m)   Wt 113.4 kg (250 lb)   SpO2 100%   BMI 42.91 kg/m   Physical Exam  Constitutional: She appears well-developed and well-nourished. No distress.  No facial swelling.  Oral airway widely patent.  No appreciable swelling of tongue or mucous membranes.  HENT:  Head: Normocephalic and atraumatic.  Eyes: Conjunctivae and EOM are normal.  Neck: Neck supple.  Cardiovascular: Normal rate and regular rhythm.  No murmur heard. Pulmonary/Chest: Effort normal and breath sounds normal. No respiratory distress.  Abdominal: Soft. There is no tenderness.  Musculoskeletal: She exhibits no edema.  Neurological: She is alert.  Skin: Skin is warm and dry. No rash noted.  Psychiatric: She has a normal mood and affect.  Nursing note and vitals reviewed.    ED Treatments / Results  Labs (all labs ordered are listed, but only abnormal results are displayed) Labs Reviewed - No data to display  EKG  EKG Interpretation None       Radiology No results found.  Procedures Procedures (including critical care time)  Medications Ordered in ED Medications - No data to display   Initial Impression / Assessment and Plan / ED Course  I have reviewed the triage vital signs and the nursing notes.  Pertinent labs & imaging results that were available during my care of the patient were reviewed by me and considered in my medical decision making (see chart for details).     Final Clinical Impressions(s) / ED Diagnoses   Final diagnoses:  Allergic reaction, initial encounter  Consistent with self-limited allergic reaction to seafood.  Patient likely got small amount of exposure via deep fryer.  She is getting improvement with Benadryl.  She did not go on to have anaphylactic type symptoms.  This time I will  have the patient take  prednisone for 3 days and continue the Benadryl.  Refill for EpiPen is given.  Patient is stable and advised to return if symptoms should worsen or change.  ED Discharge Orders        Ordered    predniSONE (DELTASONE) 20 MG tablet     03/19/17 1653    diphenhydrAMINE (BENADRYL) 25 MG tablet  Every 6 hours PRN     03/19/17 1653    EPINEPHrine (EPIPEN 2-PAK) 0.3 mg/0.3 mL IJ SOAJ injection   Once     03/19/17 1653       Arby Barrette, MD 03/19/17 1718

## 2017-03-19 NOTE — ED Notes (Signed)
ED Provider at bedside. 

## 2017-04-22 ENCOUNTER — Other Ambulatory Visit: Payer: Self-pay | Admitting: Family Medicine

## 2017-04-22 ENCOUNTER — Ambulatory Visit
Admission: RE | Admit: 2017-04-22 | Discharge: 2017-04-22 | Disposition: A | Discharge status: Home / Self Care | Payer: BLUE CROSS/BLUE SHIELD | Source: Ambulatory Visit | Attending: Family Medicine | Admitting: Family Medicine

## 2017-04-22 DIAGNOSIS — R1032 Left lower quadrant pain: Secondary | ICD-10-CM

## 2017-12-01 ENCOUNTER — Other Ambulatory Visit: Payer: Self-pay | Admitting: Family Medicine

## 2017-12-01 DIAGNOSIS — Z1231 Encounter for screening mammogram for malignant neoplasm of breast: Secondary | ICD-10-CM

## 2018-01-07 ENCOUNTER — Ambulatory Visit: Payer: BLUE CROSS/BLUE SHIELD

## 2018-01-07 ENCOUNTER — Ambulatory Visit
Admission: RE | Admit: 2018-01-07 | Discharge: 2018-01-07 | Disposition: A | Discharge status: Home / Self Care | Payer: BLUE CROSS/BLUE SHIELD | Source: Ambulatory Visit | Attending: Family Medicine | Admitting: Family Medicine

## 2018-01-07 DIAGNOSIS — Z1231 Encounter for screening mammogram for malignant neoplasm of breast: Secondary | ICD-10-CM

## 2018-04-28 ENCOUNTER — Encounter (INDEPENDENT_AMBULATORY_CARE_PROVIDER_SITE_OTHER): Payer: Self-pay

## 2018-05-03 ENCOUNTER — Ambulatory Visit
Admission: RE | Admit: 2018-05-03 | Discharge: 2018-05-03 | Disposition: A | Discharge status: Home / Self Care | Payer: BLUE CROSS/BLUE SHIELD | Source: Ambulatory Visit | Attending: Family Medicine | Admitting: Family Medicine

## 2018-05-03 ENCOUNTER — Other Ambulatory Visit: Payer: Self-pay | Admitting: Family Medicine

## 2018-05-03 DIAGNOSIS — R079 Chest pain, unspecified: Secondary | ICD-10-CM

## 2018-05-04 ENCOUNTER — Ambulatory Visit (INDEPENDENT_AMBULATORY_CARE_PROVIDER_SITE_OTHER): Payer: Self-pay | Admitting: Family Medicine

## 2018-05-04 ENCOUNTER — Encounter (INDEPENDENT_AMBULATORY_CARE_PROVIDER_SITE_OTHER): Payer: Self-pay

## 2018-05-18 ENCOUNTER — Ambulatory Visit (INDEPENDENT_AMBULATORY_CARE_PROVIDER_SITE_OTHER): Payer: Self-pay | Admitting: Family Medicine

## 2018-05-18 ENCOUNTER — Other Ambulatory Visit: Payer: Self-pay | Admitting: Family Medicine

## 2018-05-18 DIAGNOSIS — R079 Chest pain, unspecified: Secondary | ICD-10-CM

## 2018-05-18 DIAGNOSIS — R091 Pleurisy: Secondary | ICD-10-CM

## 2018-05-23 ENCOUNTER — Inpatient Hospital Stay: Admission: RE | Admit: 2018-05-23 | Payer: BLUE CROSS/BLUE SHIELD | Source: Ambulatory Visit

## 2018-05-26 ENCOUNTER — Other Ambulatory Visit: Payer: Self-pay | Admitting: Family Medicine

## 2018-05-26 DIAGNOSIS — R079 Chest pain, unspecified: Secondary | ICD-10-CM

## 2018-05-26 DIAGNOSIS — R091 Pleurisy: Secondary | ICD-10-CM

## 2018-06-28 ENCOUNTER — Encounter (INDEPENDENT_AMBULATORY_CARE_PROVIDER_SITE_OTHER): Payer: Self-pay

## 2018-11-28 ENCOUNTER — Other Ambulatory Visit: Payer: Self-pay | Admitting: *Deleted

## 2018-11-28 DIAGNOSIS — Z20822 Contact with and (suspected) exposure to covid-19: Secondary | ICD-10-CM

## 2018-11-29 LAB — NOVEL CORONAVIRUS, NAA: SARS-CoV-2, NAA: NOT DETECTED

## 2018-12-15 ENCOUNTER — Other Ambulatory Visit: Payer: Self-pay | Admitting: Family Medicine

## 2018-12-15 DIAGNOSIS — Z1231 Encounter for screening mammogram for malignant neoplasm of breast: Secondary | ICD-10-CM

## 2019-01-05 ENCOUNTER — Ambulatory Visit
Admission: RE | Admit: 2019-01-05 | Discharge: 2019-01-05 | Disposition: A | Discharge status: Home / Self Care | Payer: BC Managed Care – PPO | Source: Ambulatory Visit | Attending: Family Medicine | Admitting: Family Medicine

## 2019-01-05 ENCOUNTER — Other Ambulatory Visit: Payer: Self-pay

## 2019-01-05 ENCOUNTER — Other Ambulatory Visit: Payer: Self-pay | Admitting: Family Medicine

## 2019-01-05 DIAGNOSIS — R079 Chest pain, unspecified: Secondary | ICD-10-CM

## 2019-01-10 ENCOUNTER — Other Ambulatory Visit: Payer: Self-pay

## 2019-01-10 ENCOUNTER — Ambulatory Visit
Admission: RE | Admit: 2019-01-10 | Discharge: 2019-01-10 | Disposition: A | Discharge status: Home / Self Care | Payer: BC Managed Care – PPO | Source: Ambulatory Visit | Attending: Family Medicine | Admitting: Family Medicine

## 2019-01-10 DIAGNOSIS — Z1231 Encounter for screening mammogram for malignant neoplasm of breast: Secondary | ICD-10-CM

## 2019-01-30 ENCOUNTER — Ambulatory Visit: Payer: BLUE CROSS/BLUE SHIELD

## 2019-04-19 ENCOUNTER — Other Ambulatory Visit: Payer: Self-pay

## 2019-04-19 ENCOUNTER — Encounter: Payer: Self-pay | Admitting: Physical Therapy

## 2019-04-19 ENCOUNTER — Ambulatory Visit: Payer: BC Managed Care – PPO | Attending: Family Medicine | Admitting: Physical Therapy

## 2019-04-19 DIAGNOSIS — M5441 Lumbago with sciatica, right side: Secondary | ICD-10-CM | POA: Diagnosis present

## 2019-04-19 DIAGNOSIS — R293 Abnormal posture: Secondary | ICD-10-CM

## 2019-04-19 DIAGNOSIS — M6281 Muscle weakness (generalized): Secondary | ICD-10-CM

## 2019-04-19 NOTE — Patient Instructions (Signed)
Access Code: [email or web address]  URL: [email or web address]  Date: 04/19/2019  Prepared by: Loistine Simas Maisy Newport   Exercises  Lying Prone - 1x daily - 7x weekly  Standing Back Extension - 10 reps - 3 sets - 1x daily - 7x weekly  Sidelying Transversus Abdominis Bracing - 10 reps - 1 sets - 10 hold - 1x daily - 7x weekly

## 2019-04-19 NOTE — Therapy (Signed)
[facility] [address], [address] [place], [place], [address] Phone: [phone]   Fax:  [phone]  Physical Therapy Evaluation  Patient Details  Name: [name] [name] MRN: [id number] Date of Birth: [birth year] Referring Provider (PT): Darrow Bussing, MD   Encounter Date: 04/19/2019  PT End of Session - 04/19/19 1103    Visit Number  1    Date for PT Re-Evaluation  06/14/19    Authorization Type  BCBS    Authorization - Visit Number  1    Authorization - Number of Visits  30    PT Start Time  1015    PT Stop Time  1100    PT Time Calculation (min)  45 min    Activity Tolerance  Patient limited by pain    Behavior During Therapy  [facility] for tasks assessed/performed       Past Medical History:  Diagnosis Date  . Anemia    history  . Anxiety   . Arthritis    lower back, knee  . Asthma   . Bradycardia 09/26/2014  . Depression   . Diabetes mellitus without complication (HCC)   . Esophageal reflux   . GERD (gastroesophageal reflux disease)   . H/O gestational diabetes mellitus, not currently pregnant   . Headache(784.0)    on rx med  . Hyperlipidemia    diet controlled  . Hypertension   . Mental disorder   . Migraines   . Obesity   . OSA (obstructive sleep apnea)    Mild w AHI 14/hr now on CPAP at 9cm H2O  . PONV (postoperative nausea and vomiting)   . Seasonal allergies   . Sleep apnea    cpap setting 70  . SVD (spontaneous vaginal delivery)    x 2    Past Surgical History:  Procedure Laterality Date  . ABDOMINAL HYSTERECTOMY    . carpel tunnel surgery     x 2 - left and right  . CESAREAN SECTION  1987   x 1  . CYSTOSCOPY N/A 08/08/2012   Procedure: CYSTOSCOPY;  Surgeon: Serita Kyle, MD;  Location: [facility];  Service: Gynecology;  Laterality: N/A;  . left foot surgery     remove cyst  . ROBOTIC ASSISTED LAPAROSCOPIC LYSIS OF ADHESION N/A 08/08/2012   Procedure: ROBOTIC ASSISTED  LAPAROSCOPIC exctensive LYSIS OF ADHESION;  Surgeon: Serita Kyle, MD;  Location: [facility];  Service: Gynecology;  Laterality: N/A;  . ROBOTIC ASSISTED SALPINGO OOPHERECTOMY Left 08/08/2012   Procedure: ROBOTIC ASSISTED SALPINGO OOPHORECTOMY;  Surgeon: Serita Kyle, MD;  Location: [facility];  Service: Gynecology;  Laterality: Left;  . trigger thumb release     x 2 for right and left thumbs  . WISDOM TOOTH EXTRACTION      There were no vitals filed for this visit.   Subjective Assessment - 04/19/19 1017    Subjective  Pt has history of back pain with flare up last week while standing folding clothes.  Onset of Rt lateral/anterior thigh pain accompanied pain.  Pain is worsening over time.  Sleep is disrupted due to pain.    Limitations  Sitting;Standing;Walking;House hold activities;Lifting    How long can you sit comfortably?  30 min    How long can you stand comfortably?  5-10 min    How long can you walk comfortably?  5-10 min    Diagnostic tests  MRI 2019 L3/4 mild disc space narrowing    Patient Stated Goals  sleep through night, get back to walking up to 5 miles/day, get rid of pain    Currently in Pain?  Yes    Pain Score  7     Pain Location  Back    Pain Orientation  Lower;Mid;Right;Left    Pain Descriptors / Indicators  Sharp;Aching;Dull    Pain Type  Acute pain;Chronic pain    Pain Radiating Towards  Rt buttock and Rt lateral thigh    Pain Onset  1 to 4 weeks ago    Pain Frequency  Constant    Aggravating Factors   standing/walking, bending, bed mobility    Pain Relieving Factors  heat    Effect of Pain on Daily Activities  has to sit for cooking prep         [facility] PT Assessment - 04/19/19 0001      Assessment   Medical Diagnosis  M54.41 (ICD-10-CM) - Lumbago with sciatica, right side    Referring Provider (PT)  Koirala, Dibas, MD    Onset Date/Surgical Date  --   1-2 weeks ago   Next MD Visit  as needed    Prior Therapy  yes      Precautions    Precautions  None      Restrictions   Weight Bearing Restrictions  No      Balance Screen   Has the patient fallen in the past 6 months  No      Home Environment   Living Environment  Private residence    Living Arrangements  Children    Type of Home  House    Home Access  Stairs to enter    Entrance Stairs-Number of Steps  3    Home Layout  One level      Prior Function   Level of Independence  Independent    Vocation  Unemployed    Leisure  walking, couponing      Cognition   Overall Cognitive Status  Within Functional Limits for tasks assessed      Observation/Other Assessments   Focus on Therapeutic Outcomes (FOTO)   78%   49%     Posture/Postural Control   Posture/Postural Control  Postural limitations    Postural Limitations  Increased lumbar lordosis;Rounded Shoulders;Forward head    Posture Comments  pubic bone anterior to sternum, Pt "hangs" trunk on lumbar spine      ROM / Strength   AROM / PROM / Strength  AROM;Strength      AROM   Overall AROM Comments  trunk flexion limited 50% with pain, bil SB limited 75% with pain, ext limited 20% with possible relief      Strength   Overall Strength Comments  Rt LE strength limited testing to due pain on resistance: 3+/5 knee, 4-/5 hip, Lt LE 5/5 throughout      Flexibility   Soft Tissue Assessment /Muscle Length  --   difficult to assess secondary to pain with all LE movement     Palpation   Spinal mobility  unable to assess due to high pain levels      Special Tests    Special Tests  Lumbar    Lumbar Tests  Slump Test;Straight Leg Raise      Slump test   Findings  Positive    Side  Right      Straight Leg Raise   Findings  Positive    Side   Right      Bed Mobility   Bed  Mobility  --   needed cueing for safe performance, able to do ind     Transfers   Transfers  Sit to Stand    Comments  uses bil UEs, slow for pain      Ambulation/Gait   Gait Pattern  Wide base of support                 Objective measurements completed on examination: See above findings.      [facility] Adult PT Treatment/Exercise - 04/19/19 0001      Self-Care   Self-Care  Posture;Other Self-Care Comments    Posture  use of towel roll in sitting, standing posture alignment sternum over pubic bone and use TrA    Other Self-Care Comments   bed mobility performance             PT Education - 04/19/19 1248    Education Details  Access Code: [email or web address]    Person(s) Educated  Patient    Methods  Explanation;Demonstration;Verbal cues;Handout    Comprehension  Verbalized understanding;Returned demonstration       PT Short Term Goals - 04/19/19 1258      PT SHORT TERM GOAL #1   Title  Pt will be ind in intial HEP and demo proper standing, sitting posture and bed mobility.    Time  3    Period  Weeks    Status  New    Target Date  05/10/19      PT SHORT TERM GOAL #2   Title  pt will report less sleep disruption due to pain by at least 25%    Time  4    Period  Weeks    Status  New    Target Date  05/17/19        PT Long Term Goals - 04/19/19 1259      PT LONG TERM GOAL #1   Title  Pt will achieve at least 4/5 strength throughout Rt LE to improve functional tasks such as transfers, stairs and walking for exercise.    Time  8    Period  Weeks    Status  New    Target Date  06/14/19      PT LONG TERM GOAL #2   Title  Pt will improve lumbar flexion to enable her to don shoes/socks ind    Time  8    Period  Weeks    Status  New    Target Date  06/14/19      PT LONG TERM GOAL #3   Title  Pt will report reduced sleep disruption due to pain by at least 70%    Time  8    Period  Weeks    Status  New    Target Date  06/14/19      PT LONG TERM GOAL #4   Title  Pt will reduce FOTO to 49% or less to demo less limitations in daily activities.    Time  8    Period  Weeks    Status  New    Target Date  06/14/19      PT LONG TERM GOAL #5   Title  Pt will be ind in  HEP including return to walking for exercise at least 3x/week to improve overall mobility, strength and endurance.    Time  8    Period  Weeks    Status  New    Target Date  06/14/19  Plan - 04/19/19 1248    Clinical Impression Statement  Pt is a 54yo female with flare of up back pain last week while standing folding laundry.  She reports Rt lateral thigh > plantar foot numbness which began with flare up.  Pain limits standing, walking and sitting to 30 min.  Pain level was high and limited full evaluation.  Trunk and hip ROM is signif limited by pain.  Pt reported some initial relief with trunk backward bending and prone lying.  She has weakness in Rt LE at hip and knee 3+/5 compared to 4/5 throughout Lt LE.  She has + Slump and SLR on Rt.  She has poor trunk stabilization and postural deficits.  PT will have Pt trial prone lying and standing ext with understanding to d/c if pain increases.  PT also educated Pt on proper seated (with towel roll) posture, standing alignment, bed mobility and core use for transfers today.  Pt will benefit from skilled PT for modalities for pain, ROM, stabilization and functional strength training to improve deficits and pain.    Personal Factors and Comorbidities  Fitness;Comorbidity 1;Time since onset of injury/illness/exacerbation    Comorbidities  obesity, diabetes, high blood pressure, chronic LBP with acute flare up    Examination-Activity Limitations  Locomotion Level;Transfers;Bed Mobility;Bend;Sit;Carry;Sleep;Stand;Lift;Stairs;Squat    Examination-Participation Restrictions  Community Activity;Driving;Shop;Cleaning;Laundry;Meal Prep    Stability/Clinical Decision Making  Stable/Uncomplicated    Clinical Decision Making  Low    Rehab Potential  Good    PT Frequency  2x / week    PT Duration  8 weeks    PT Treatment/Interventions  ADLs/Self Care Home Management;Aquatic Therapy;Cryotherapy;Electrical Stimulation;Moist Heat;Iontophoresis 4mg /ml  Dexamethasone;Traction;Stair training;Gait training;Functional mobility training;Therapeutic activities;Therapeutic exercise;Neuromuscular re-education;Manual techniques;Patient/family education;Passive range of motion;Dry needling;Taping;Spinal Manipulations;Joint Manipulations    PT Next Visit Plan  f/u on ext exercises, revisit standing posture with core, revisit sidelying TrA, add sit to stand, clam, row, seated or supine ther ex for lumbar stab, TENS/heat    Consulted and Agree with Plan of Care  Patient       Patient will benefit from skilled therapeutic intervention in order to improve the following deficits and impairments:  Abnormal gait, Decreased range of motion, Decreased endurance, Increased muscle spasms, Obesity, Pain, Decreased activity tolerance, Hypomobility, Impaired flexibility, Improper body mechanics, Postural dysfunction, Decreased mobility, Decreased strength  Visit Diagnosis: Acute midline low back pain with right-sided sciatica - Plan: PT plan of care cert/re-cert  Muscle weakness (generalized) - Plan: PT plan of care cert/re-cert  Abnormal posture - Plan: PT plan of care cert/re-cert     Problem List Patient Active Problem List   Diagnosis Date Noted  . Bradycardia 09/26/2014  . Hypertension   . Obstructive sleep apnea 05/03/2013  . Obesity 05/27/2006  . ASTHMA, UNSPECIFIED 05/27/2006    05/29/2006, PT 04/19/19 1:04 PM   [facility] [address], [address] [place], [place], [place] Phone: [phone]   Fax:  [phone]  Name: AMNA WELKER MRN: Nicholes Calamity Date of Birth: Jul 29, 1965

## 2019-04-21 ENCOUNTER — Encounter: Payer: Self-pay | Admitting: Physical Therapy

## 2019-04-21 ENCOUNTER — Other Ambulatory Visit: Payer: Self-pay

## 2019-04-21 ENCOUNTER — Ambulatory Visit: Payer: BC Managed Care – PPO | Admitting: Physical Therapy

## 2019-04-21 DIAGNOSIS — M6281 Muscle weakness (generalized): Secondary | ICD-10-CM

## 2019-04-21 DIAGNOSIS — M5441 Lumbago with sciatica, right side: Secondary | ICD-10-CM | POA: Diagnosis not present

## 2019-04-21 DIAGNOSIS — R293 Abnormal posture: Secondary | ICD-10-CM

## 2019-04-21 NOTE — Therapy (Signed)
[facility] [address], [place], [place], [address] Phone: [phone]   Fax:  [phone]  Physical Therapy Treatment  Patient Details  Name: [name] [name] MRN: [id number] Date of Birth: 20-Aug-1965 Referring Provider (PT): Lujean Amel, MD   Encounter Date: 04/21/2019  PT End of Session - 04/21/19 0941    Visit Number  2    Date for PT Re-Evaluation  06/14/19    Authorization Type  BCBS    Authorization - Visit Number  2    Authorization - Number of Visits  30    PT Start Time  5361    PT Stop Time  0927    PT Time Calculation (min)  43 min    Activity Tolerance  Patient limited by pain       Past Medical History:  Diagnosis Date  . Anemia    history  . Anxiety   . Arthritis    lower back, knee  . Asthma   . Bradycardia 09/26/2014  . Depression   . Diabetes mellitus without complication ([employer])   . Esophageal reflux   . GERD (gastroesophageal reflux disease)   . H/O gestational diabetes mellitus, not currently pregnant   . Headache(784.0)    on rx med  . Hyperlipidemia    diet controlled  . Hypertension   . Mental disorder   . Migraines   . Obesity   . OSA (obstructive sleep apnea)    Mild w AHI 14/hr now on CPAP at 9cm H2O  . PONV (postoperative nausea and vomiting)   . Seasonal allergies   . Sleep apnea    cpap setting 70  . SVD (spontaneous vaginal delivery)    x 2    Past Surgical History:  Procedure Laterality Date  . ABDOMINAL HYSTERECTOMY    . carpel tunnel surgery     x 2 - left and right  . [place]   x 1  . CYSTOSCOPY N/A 08/08/2012   Procedure: CYSTOSCOPY;  Surgeon: Marvene Staff, MD;  Location: [facility];  Service: Gynecology;  Laterality: N/A;  . left foot surgery     remove cyst  . ROBOTIC ASSISTED LAPAROSCOPIC LYSIS OF ADHESION N/A 08/08/2012   Procedure: ROBOTIC ASSISTED LAPAROSCOPIC exctensive LYSIS OF ADHESION;  Surgeon: Marvene Staff, MD;  Location: [facility];  Service: Gynecology;  Laterality: N/A;  . ROBOTIC ASSISTED SALPINGO OOPHERECTOMY Left 08/08/2012   Procedure: ROBOTIC ASSISTED SALPINGO OOPHORECTOMY;  Surgeon: Marvene Staff, MD;  Location: [facility];  Service: Gynecology;  Laterality: Left;  . trigger thumb release     x 2 for right and left thumbs  . WISDOM TOOTH EXTRACTION      There were no vitals filed for this visit.  Subjective Assessment - 04/21/19 0847    Subjective  Today is not a good day.  I had a rough night.  I might have done too much exercise.  I did a lot of housework.  Both sides of my back, mostly right, buttock numbness.    Currently in Pain?  Yes    Pain Score  10-Worst pain ever    Pain Location  Back    Pain Orientation  Right;Left                       [facility] Adult PT Treatment/Exercise - 04/21/19 0001      Self-Care   ADL's  centralization principle  Posture  discussion of frequent change of position; short walks; prolonged sitting avoidance    Other Self-Care Comments   use of home TENS for pain control       Lumbar Exercises: Prone   Other Prone Lumbar Exercises  prone press ups (partial range) 10x       Moist Heat Therapy   Number Minutes Moist Heat  15 Minutes    Moist Heat Location  Lumbar Spine      Electrical Stimulation   Electrical Stimulation Location  right and left lumbar, right buttock,right lateral thigh     Electrical Stimulation Action  IFC    Electrical Stimulation Parameters  22 ma 15 min prone              PT Education - 04/21/19 0939    Education Details  Access Code: [email or web address]  trial of prone press ups, home TENS info; basic self care    Person(s) Educated  Patient    Methods  Explanation;Demonstration;Handout    Comprehension  Returned demonstration;Verbalized understanding       PT Short Term Goals - 04/19/19 1258      PT SHORT TERM GOAL #1   Title  Pt will be ind in intial HEP and demo proper standing, sitting  posture and bed mobility.    Time  3    Period  Weeks    Status  New    Target Date  05/10/19      PT SHORT TERM GOAL #2   Title  pt will report less sleep disruption due to pain by at least 25%    Time  4    Period  Weeks    Status  New    Target Date  05/17/19        PT Long Term Goals - 04/19/19 1259      PT LONG TERM GOAL #1   Title  Pt will achieve at least 4/5 strength throughout Rt LE to improve functional tasks such as transfers, stairs and walking for exercise.    Time  8    Period  Weeks    Status  New    Target Date  06/14/19      PT LONG TERM GOAL #2   Title  Pt will improve lumbar flexion to enable her to don shoes/socks ind    Time  8    Period  Weeks    Status  New    Target Date  06/14/19      PT LONG TERM GOAL #3   Title  Pt will report reduced sleep disruption due to pain by at least 70%    Time  8    Period  Weeks    Status  New    Target Date  06/14/19      PT LONG TERM GOAL #4   Title  Pt will reduce FOTO to 49% or less to demo less limitations in daily activities.    Time  8    Period  Weeks    Status  New    Target Date  06/14/19      PT LONG TERM GOAL #5   Title  Pt will be ind in HEP including return to walking for exercise at least 3x/week to improve overall mobility, strength and endurance.    Time  8    Period  Weeks    Status  New    Target Date  06/14/19  Plan - 04/21/19 0941    Clinical Impression Statement  The patient continues to be in significant pain with 10/10 pain rating on arrival.  She has numbness in right buttock and thigh.  She has a positive response to prone lying with heat/electrical stimulation and is able to perform partial range lumbar press ups.  Buttock and thigh symptoms decreased following treatment session and patient able to transition from prone to sitting to standing with ease.  Extensive patient education on basic self care strategies, change of position, short walks and centralization  principles.  Therapist closely monitoring response during all treatment interventions.    Examination-Activity Limitations  Locomotion Level;Transfers;Bed Mobility;Bend;Sit;Carry;Sleep;Stand;Lift;Stairs;Squat    Stability/Clinical Decision Making  Stable/Uncomplicated    Rehab Potential  Good    PT Frequency  2x / week    PT Duration  8 weeks    PT Treatment/Interventions  ADLs/Self Care Home Management;Aquatic Therapy;Cryotherapy;Electrical Stimulation;Moist Heat;Iontophoresis 4mg /ml Dexamethasone;Traction;Stair training;Gait training;Functional mobility training;Therapeutic activities;Therapeutic exercise;Neuromuscular re-education;Manual techniques;Patient/family education;Passive range of motion;Dry needling;Taping;Spinal Manipulations;Joint Manipulations    PT Next Visit Plan  assess response to initiation of prone pressups;  revisit sidelying TrA, add sit to stand, clam, row, seated or supine ther ex for lumbar stab, TENS/heat (she may get one for home and may need instruction for set up).       Patient will benefit from skilled therapeutic intervention in order to improve the following deficits and impairments:  Abnormal gait, Decreased range of motion, Decreased endurance, Increased muscle spasms, Obesity, Pain, Decreased activity tolerance, Hypomobility, Impaired flexibility, Improper body mechanics, Postural dysfunction, Decreased mobility, Decreased strength  Visit Diagnosis: Acute midline low back pain with right-sided sciatica  Muscle weakness (generalized)  Abnormal posture     Problem List Patient Active Problem List   Diagnosis Date Noted  . Bradycardia 09/26/2014  . Hypertension   . Obstructive sleep apnea 05/03/2013  . Obesity 05/27/2006  . ASTHMA, UNSPECIFIED 05/27/2006   05/29/2006, PT 04/21/19 9:51 AM Phone: [phone] Fax: [phone] 04/21/2019, 9:49 AM  [facility] [address] [place], [place], [place] Phone: [phone]   Fax:  [phone]  Name: ARNELLE NALE MRN: Nicholes Calamity Date of Birth: 02-Jun-1965

## 2019-04-21 NOTE — Patient Instructions (Signed)
Access Code: [email or web address]  URL: [email or web address]  Date: 04/21/2019  Prepared by: Lavinia Sharps   Exercises Lying Prone - 1x daily - 7x weekly Standing Back Extension - 10 reps - 3 sets - 1x daily - 7x weekly Sidelying Transversus Abdominis Bracing - 10 reps - 1 sets - 10 hold - 1x daily - 7x weekly Prone Press Up - 10 reps - 1 sets - 2 hold - 4x daily - 7x weekly   TENS UNIT  This is helpful for muscle pain and spasm.   Search and Purchase a TENS 7000 2nd edition at www.tenspros.com or www.amazon.com  (It should be less than $30)     TENS unit instructions:   Do not shower or bathe with the unit on  Turn the unit off before removing electrodes or batteries  If the electrodes lose stickiness add a drop of water to the electrodes after they are disconnected from the unit and place on plastic sheet. If you continued to have difficulty, call the TENS unit company to purchase more electrodes.  Do not apply lotion on the skin area prior to use. Make sure the skin is clean and dry as this will help prolong the life of the electrodes.  After use, always check skin for unusual red areas, rash or other skin difficulties. If there are any skin problems, does not apply electrodes to the same area.  Never remove the electrodes from the unit by pulling the wires.  Do not use the TENS unit or electrodes other than as directed.  Do not change electrode placement without consulting your therapist or physician.  Keep 2 fingers with between each electrode.   Lavinia Sharps PT [facility] [address], [address] [place], [place] [address] Phone # [phone] Fax [phone]

## 2019-04-25 ENCOUNTER — Encounter: Payer: Self-pay | Admitting: Physical Therapy

## 2019-04-25 ENCOUNTER — Ambulatory Visit: Payer: BC Managed Care – PPO | Admitting: Physical Therapy

## 2019-04-25 ENCOUNTER — Other Ambulatory Visit: Payer: Self-pay

## 2019-04-25 DIAGNOSIS — M6281 Muscle weakness (generalized): Secondary | ICD-10-CM

## 2019-04-25 DIAGNOSIS — M5441 Lumbago with sciatica, right side: Secondary | ICD-10-CM

## 2019-04-25 DIAGNOSIS — R293 Abnormal posture: Secondary | ICD-10-CM

## 2019-04-25 NOTE — Therapy (Signed)
[facility] [address] [place], [place], [address] Phone: [phone]   Fax:  [phone]  Physical Therapy Treatment  Patient Details  Name: [name] [name] MRN: [id number] Date of Birth: January 21, 1966 Referring Provider (PT): Darrow Bussing, MD   Encounter Date: 04/25/2019  PT End of Session - 04/25/19 0907    Visit Number  3    Date for PT Re-Evaluation  06/14/19    Authorization Type  BCBS 30 visit limit    Authorization - Visit Number  3    Authorization - Number of Visits  30    PT Start Time  [phone]    PT Stop Time  0811    PT Time Calculation (min)  38 min    Activity Tolerance  Patient limited by pain       Past Medical History:  Diagnosis Date  . Anemia    history  . Anxiety   . Arthritis    lower back, knee  . Asthma   . Bradycardia 09/26/2014  . Depression   . Diabetes mellitus without complication (HCC)   . Esophageal reflux   . GERD (gastroesophageal reflux disease)   . H/O gestational diabetes mellitus, not currently pregnant   . Headache(784.0)    on rx med  . Hyperlipidemia    diet controlled  . Hypertension   . Mental disorder   . Migraines   . Obesity   . OSA (obstructive sleep apnea)    Mild w AHI 14/hr now on CPAP at 9cm H2O  . PONV (postoperative nausea and vomiting)   . Seasonal allergies   . Sleep apnea    cpap setting 70  . SVD (spontaneous vaginal delivery)    x 2    Past Surgical History:  Procedure Laterality Date  . ABDOMINAL HYSTERECTOMY    . carpel tunnel surgery     x 2 - left and right  . CESAREAN SECTION  1987   x 1  . CYSTOSCOPY N/A 08/08/2012   Procedure: CYSTOSCOPY;  Surgeon: Serita Kyle, MD;  Location: [facility];  Service: Gynecology;  Laterality: N/A;  . left foot surgery     remove cyst  . ROBOTIC ASSISTED LAPAROSCOPIC LYSIS OF ADHESION N/A 08/08/2012   Procedure: ROBOTIC ASSISTED LAPAROSCOPIC exctensive LYSIS OF ADHESION;  Surgeon:  Serita Kyle, MD;  Location: [facility];  Service: Gynecology;  Laterality: N/A;  . ROBOTIC ASSISTED SALPINGO OOPHERECTOMY Left 08/08/2012   Procedure: ROBOTIC ASSISTED SALPINGO OOPHORECTOMY;  Surgeon: Serita Kyle, MD;  Location: [facility];  Service: Gynecology;  Laterality: Left;  . trigger thumb release     x 2 for right and left thumbs  . WISDOM TOOTH EXTRACTION      There were no vitals filed for this visit.  Subjective Assessment - 04/25/19 0734    Subjective  Initially reports 9/10, then changes to 7/10. "Exercising is a mess."  Pushing too high with press ups worsens the pain.  "My son said I was overdoing it by exercising."   " I ordered a home TENS unit but not here yet."    Patient Stated Goals  sleep through night, get back to walking up to 5 miles/day, get rid of pain    Currently in Pain?  Yes    Pain Score  7     Pain Location  Back    Pain Orientation  Right  [facility] Adult PT Treatment/Exercise - 04/25/19 0001      Lumbar Exercises: Aerobic   UBE (Upper Arm Bike)  1 min forward/1 min backward standing       Lumbar Exercises: Supine   Ab Set  10 reps    Other Supine Lumbar Exercises  hooklying ball squeeze between knees 10x       Lumbar Exercises: Sidelying   Other Sidelying Lumbar Exercises  right and left sidelying ab set 10x each       Lumbar Exercises: Prone   Other Prone Lumbar Exercises  small range press ups 8x      Lumbar Exercises: Quadruped   [place]  10 reps    Other Quadruped Lumbar Exercises  rocking forward and back 10x       Moist Heat Therapy   Number Minutes Moist Heat  12 Minutes    Moist Heat Location  Lumbar Spine      Electrical Stimulation   Electrical Stimulation Location  right low back     Electrical Stimulation Action  IFC     Electrical Stimulation Parameters  22 ma 12 min prone with roll under feet              PT Education - 04/25/19 0906    Education Details   discussed midrange ex's only for pain modulation: partial press ups; abdominal draw in, quad rocking    Person(s) Educated  Patient    Methods  Explanation;Demonstration    Comprehension  Verbalized understanding;Returned demonstration       PT Short Term Goals - 04/19/19 1258      PT SHORT TERM GOAL #1   Title  Pt will be ind in intial HEP and demo proper standing, sitting posture and bed mobility.    Time  3    Period  Weeks    Status  New    Target Date  05/10/19      PT SHORT TERM GOAL #2   Title  pt will report less sleep disruption due to pain by at least 25%    Time  4    Period  Weeks    Status  New    Target Date  05/17/19        PT Long Term Goals - 04/19/19 1259      PT LONG TERM GOAL #1   Title  Pt will achieve at least 4/5 strength throughout Rt LE to improve functional tasks such as transfers, stairs and walking for exercise.    Time  8    Period  Weeks    Status  New    Target Date  06/14/19      PT LONG TERM GOAL #2   Title  Pt will improve lumbar flexion to enable her to don shoes/socks ind    Time  8    Period  Weeks    Status  New    Target Date  06/14/19      PT LONG TERM GOAL #3   Title  Pt will report reduced sleep disruption due to pain by at least 70%    Time  8    Period  Weeks    Status  New    Target Date  06/14/19      PT LONG TERM GOAL #4   Title  Pt will reduce FOTO to 49% or less to demo less limitations in daily activities.    Time  8    Period  Weeks  Status  New    Target Date  06/14/19      PT LONG TERM GOAL #5   Title  Pt will be ind in HEP including return to walking for exercise at least 3x/week to improve overall mobility, strength and endurance.    Time  8    Period  Weeks    Status  New    Target Date  06/14/19            Plan - 04/25/19 0908    Clinical Impression Statement  The patient continues to report "severe" level back pain.   Although she reports no buttock or right LE symptoms today (a very  positive response), she is frustrated and fearful of "doing too much".  Discussed the importance of continued movement to promote healing.  Treatment focus on midrange of motion and patient education/fear reduction of movement.  She reports pain relief with sidelying transverse abdominus muscle activation and prone lying for ES/heat.    PT Frequency  2x / week    PT Duration  8 weeks    PT Treatment/Interventions  ADLs/Self Care Home Management;Aquatic Therapy;Cryotherapy;Electrical Stimulation;Moist Heat;Iontophoresis 4mg /ml Dexamethasone;Traction;Stair training;Gait training;Functional mobility training;Therapeutic activities;Therapeutic exercise;Neuromuscular re-education;Manual techniques;Patient/family education;Passive range of motion;Dry needling;Taping;Spinal Manipulations;Joint Manipulations    PT Next Visit Plan  mid range movements including sidelying TrA and quadruped, add sit to stand, clam, row, seated or supine ther ex for lumbar stab, TENS/heat (ordered one for home but hasn't received yet) .       Patient will benefit from skilled therapeutic intervention in order to improve the following deficits and impairments:  Abnormal gait, Decreased range of motion, Decreased endurance, Increased muscle spasms, Obesity, Pain, Decreased activity tolerance, Hypomobility, Impaired flexibility, Improper body mechanics, Postural dysfunction, Decreased mobility, Decreased strength  Visit Diagnosis: Acute midline low back pain with right-sided sciatica  Muscle weakness (generalized)  Abnormal posture     Problem List Patient Active Problem List   Diagnosis Date Noted  . Bradycardia 09/26/2014  . Hypertension   . Obstructive sleep apnea 05/03/2013  . Obesity 05/27/2006  . ASTHMA, UNSPECIFIED 05/27/2006   Ruben Im, PT 04/25/19 9:22 AM Phone: [phone] Fax: [phone] Alvera Singh 04/25/2019, 9:22 AM  [facility] [address], [place], [place], [address] Phone: [phone]   Fax:  [phone]  Name: NASTASSIA BAZALDUA MRN: [id number] Date of Birth: 07-07-1965

## 2019-04-26 ENCOUNTER — Ambulatory Visit: Payer: BC Managed Care – PPO | Admitting: Physical Therapy

## 2019-04-28 ENCOUNTER — Ambulatory Visit: Payer: BC Managed Care – PPO | Admitting: Physical Therapy

## 2019-04-28 ENCOUNTER — Other Ambulatory Visit: Payer: Self-pay

## 2019-04-28 ENCOUNTER — Encounter: Payer: Self-pay | Admitting: Physical Therapy

## 2019-04-28 DIAGNOSIS — M6281 Muscle weakness (generalized): Secondary | ICD-10-CM

## 2019-04-28 DIAGNOSIS — M5441 Lumbago with sciatica, right side: Secondary | ICD-10-CM | POA: Diagnosis not present

## 2019-04-28 DIAGNOSIS — R293 Abnormal posture: Secondary | ICD-10-CM

## 2019-04-28 NOTE — Patient Instructions (Addendum)
Access Code: [email or web address]  URL: [email or web address]  Date: 04/28/2019  Prepared by: Loistine Simas Gerrica Cygan   Exercises  Lying Prone - 1x daily - 7x weekly  Standing Back Extension - 10 reps - 3 sets - 1x daily - 7x weekly  Sidelying Transversus Abdominis Bracing - 10 reps - 1 sets - 10 hold - 1x daily - 7x weekly  Prone Press Up - 10 reps - 1 sets - 2 hold - 4x daily - 7x weekly  Sit to Stand with Hands on Knees - 5 reps - 3 sets - 1x daily - 7x weekly  Shoulder Extension with Resistance - 15 reps - 2 sets - 1x daily - 7x weekly  Standing Bilateral Low Shoulder Row with Anchored Resistance - 15 reps - 2 sets - 1x daily - 7x weekly

## 2019-04-28 NOTE — Therapy (Signed)
[facility] [address], [place], [place], [address] Phone: [phone]   Fax:  [phone]  Physical Therapy Treatment  Patient Details  Name: [name] [name] MRN: [id number] Date of Birth: 05-21-1965 Referring Provider (PT): Lujean Amel, MD   Encounter Date: 04/28/2019  PT End of Session - 04/28/19 0805    Visit Number  4    Date for PT Re-Evaluation  06/14/19    Authorization Type  BCBS 30 visit limit    Authorization - Visit Number  4    Authorization - Number of Visits  30    PT Start Time  0758    PT Stop Time  0850   10 min stim/heat   PT Time Calculation (min)  52 min       Past Medical History:  Diagnosis Date  . Anemia    history  . Anxiety   . Arthritis    lower back, knee  . Asthma   . Bradycardia 09/26/2014  . Depression   . Diabetes mellitus without complication ([place])   . Esophageal reflux   . GERD (gastroesophageal reflux disease)   . H/O gestational diabetes mellitus, not currently pregnant   . Headache(784.0)    on rx med  . Hyperlipidemia    diet controlled  . Hypertension   . Mental disorder   . Migraines   . Obesity   . OSA (obstructive sleep apnea)    Mild w AHI 14/hr now on CPAP at 9cm H2O  . PONV (postoperative nausea and vomiting)   . Seasonal allergies   . Sleep apnea    cpap setting 70  . SVD (spontaneous vaginal delivery)    x 2    Past Surgical History:  Procedure Laterality Date  . ABDOMINAL HYSTERECTOMY    . carpel tunnel surgery     x 2 - left and right  . [place]   x 1  . CYSTOSCOPY N/A 08/08/2012   Procedure: CYSTOSCOPY;  Surgeon: Marvene Staff, MD;  Location: [facility];  Service: Gynecology;  Laterality: N/A;  . left foot surgery     remove cyst  . ROBOTIC ASSISTED LAPAROSCOPIC LYSIS OF ADHESION N/A 08/08/2012   Procedure: ROBOTIC ASSISTED LAPAROSCOPIC exctensive LYSIS OF ADHESION;  Surgeon: Marvene Staff, MD;   Location: [facility];  Service: Gynecology;  Laterality: N/A;  . ROBOTIC ASSISTED SALPINGO OOPHERECTOMY Left 08/08/2012   Procedure: ROBOTIC ASSISTED SALPINGO OOPHORECTOMY;  Surgeon: Marvene Staff, MD;  Location: [facility];  Service: Gynecology;  Laterality: Left;  . trigger thumb release     x 2 for right and left thumbs  . WISDOM TOOTH EXTRACTION      There were no vitals filed for this visit.  Subjective Assessment - 04/28/19 0802    Subjective  Doing alright this morning.  HEP going well and helps my pain.    Limitations  Sitting;Standing;Walking;House hold activities;Lifting    How long can you sit comfortably?  30 min    How long can you stand comfortably?  5-10 min    How long can you walk comfortably?  5-10 min    Diagnostic tests  MRI 2019 L3/4 mild disc space narrowing    Patient Stated Goals  sleep through night, get back to walking up to 5 miles/day, get rid of pain    Currently in Pain?  Yes    Pain Score  5     Pain Location  Back  Pain Orientation  Right;Lower    Pain Descriptors / Indicators  Aching;Dull    Pain Type  Acute pain;Chronic pain    Pain Radiating Towards  no leg pain today    Pain Onset  More than a month ago    Pain Frequency  Constant    Aggravating Factors   stand/walk, bend, bed mobility    Pain Relieving Factors  heat                       [facility] Adult PT Treatment/Exercise - 04/28/19 0001      Exercises   Exercises  Lumbar;Shoulder;Knee/Hip      Lumbar Exercises: Aerobic   UBE (Upper Arm Bike)  4 min alt each min fwd/bwd, PT present to discuss progress and symptoms      Lumbar Exercises: Seated   Other Seated Lumbar Exercises  3# dumbbell 2x30 hip to hip, 1x30 shoulder to hip diagonals, PT cued exhale with each tap, use of thick towel roll and black pad under feet      Knee/Hip Exercises: Seated   Sit to Sand  3 sets;5 reps;with UE support   hands on knees, PT cued pause for posture check each stand     Shoulder  Exercises: Standing   Extension  Strengthening;Both;20 reps;Theraband    Theraband Level (Shoulder Extension)  Level 2 (Red)    Extension Limitations  PT cued pelvis under trunk, TrA, scapular retraction/depression    Row  Strengthening      Moist Heat Therapy   Number Minutes Moist Heat  10 Minutes    Moist Heat Location  Lumbar Spine      Electrical Stimulation   Electrical Stimulation Location  right low back     Electrical Stimulation Action  IFC    Electrical Stimulation Parameters  57ma 10 min    Electrical Stimulation Goals  Pain   prone with heat and feet propped on bolster            PT Education - 04/28/19 0841    Education Details  Access Code: [email or web address]    Person(s) Educated  Patient    Methods  Explanation;Demonstration;Verbal cues;Handout;Tactile cues    Comprehension  Verbalized understanding;Returned demonstration;Tactile cues required;Verbal cues required       PT Short Term Goals - 04/28/19 0845      PT SHORT TERM GOAL #1   Title  Pt will be ind in intial HEP and demo proper standing, sitting posture and bed mobility.    Status  On-going      PT SHORT TERM GOAL #2   Title  pt will report less sleep disruption due to pain by at least 25%    Status  On-going        PT Long Term Goals - 04/19/19 1259      PT LONG TERM GOAL #1   Title  Pt will achieve at least 4/5 strength throughout Rt LE to improve functional tasks such as transfers, stairs and walking for exercise.    Time  8    Period  Weeks    Status  New    Target Date  06/14/19      PT LONG TERM GOAL #2   Title  Pt will improve lumbar flexion to enable her to don shoes/socks ind    Time  8    Period  Weeks    Status  New    Target Date  06/14/19  PT LONG TERM GOAL #3   Title  Pt will report reduced sleep disruption due to pain by at least 70%    Time  8    Period  Weeks    Status  New    Target Date  06/14/19      PT LONG TERM GOAL #4   Title  Pt will reduce FOTO to 49% or  less to demo less limitations in daily activities.    Time  8    Period  Weeks    Status  New    Target Date  06/14/19      PT LONG TERM GOAL #5   Title  Pt will be ind in HEP including return to walking for exercise at least 3x/week to improve overall mobility, strength and endurance.    Time  8    Period  Weeks    Status  New    Target Date  06/14/19            Plan - 04/28/19 0842    Clinical Impression Statement  Pt reports lower pain levels since last visit and much greater tolerance of ther ex today.  She has started to stand during kitchen tasks and is sleeping a little better.  Session focused on postural strength and awareness with heavy need for TC and VCs for posture and technique.  PT progressed time on UBE and HEP.  Pt needs ongoing reminders for postural self-checks and will need review of ther ex next visit.  She has ordered a TENS unit but hasn't arrived yet.  Pt is motivated to work towards being more active with less pain.  Continue along POC with ongoing assessment and progression    Comorbidities  obesity, diabetes, high blood pressure, chronic LBP with acute flare up    Rehab Potential  Good    PT Frequency  2x / week    PT Duration  8 weeks    PT Treatment/Interventions  ADLs/Self Care Home Management;Aquatic Therapy;Cryotherapy;Electrical Stimulation;Moist Heat;Iontophoresis 4mg /ml Dexamethasone;Traction;Stair training;Gait training;Functional mobility training;Therapeutic activities;Therapeutic exercise;Neuromuscular re-education;Manual techniques;Patient/family education;Passive range of motion;Dry needling;Taping;Spinal Manipulations;Joint Manipulations    PT Next Visit Plan  review updated from last visit, continue postural reed, mid-range ROM, add SL clam, continue TENS/heat prone    PT Home Exercise Plan  Access Code: [email or web address]    Consulted and Agree with Plan of Care  Patient       Patient will benefit from skilled therapeutic intervention in order to  improve the following deficits and impairments:     Visit Diagnosis: Acute midline low back pain with right-sided sciatica  Muscle weakness (generalized)  Abnormal posture     Problem List Patient Active Problem List   Diagnosis Date Noted  . Bradycardia 09/26/2014  . Hypertension   . Obstructive sleep apnea 05/03/2013  . Obesity 05/27/2006  . ASTHMA, UNSPECIFIED 05/27/2006    05/29/2006, PT 04/28/19 8:46 AM   [facility] [address], [address] [place], [place], [place] Phone: [phone]   Fax:  [phone]  Name: CAMILIA CAYWOOD MRN: Nicholes Calamity Date of Birth: 01-21-1966

## 2019-05-03 ENCOUNTER — Other Ambulatory Visit: Payer: Self-pay

## 2019-05-03 ENCOUNTER — Ambulatory Visit: Payer: BC Managed Care – PPO | Attending: Family Medicine | Admitting: Physical Therapy

## 2019-05-03 ENCOUNTER — Encounter: Payer: Self-pay | Admitting: Physical Therapy

## 2019-05-03 DIAGNOSIS — M6281 Muscle weakness (generalized): Secondary | ICD-10-CM

## 2019-05-03 DIAGNOSIS — M5441 Lumbago with sciatica, right side: Secondary | ICD-10-CM

## 2019-05-03 DIAGNOSIS — R293 Abnormal posture: Secondary | ICD-10-CM | POA: Insufficient documentation

## 2019-05-03 NOTE — Therapy (Signed)
[facility] [address], [place], [place], [address] Phone: [phone]   Fax:  [phone]  Physical Therapy Treatment  Patient Details  Name: [name] [name] MRN: [id number] Date of Birth: [birth year] Referring Provider (PT): Lujean Amel, MD   Encounter Date: 05/03/2019  PT End of Session - 05/03/19 0759    Visit Number  5    Date for PT Re-Evaluation  06/14/19    Authorization Type  BCBS 30 visit limit    Authorization - Visit Number  5    Authorization - Number of Visits  30    PT Start Time  8563    PT Stop Time  0848   stim and heat end of session   PT Time Calculation (min)  53 min    Activity Tolerance  Patient tolerated treatment well    Behavior During Therapy  [facility] for tasks assessed/performed       Past Medical History:  Diagnosis Date  . Anemia    history  . Anxiety   . Arthritis    lower back, knee  . Asthma   . Bradycardia 09/26/2014  . Depression   . Diabetes mellitus without complication ([place])   . Esophageal reflux   . GERD (gastroesophageal reflux disease)   . H/O gestational diabetes mellitus, not currently pregnant   . Headache(784.0)    on rx med  . Hyperlipidemia    diet controlled  . Hypertension   . Mental disorder   . Migraines   . Obesity   . OSA (obstructive sleep apnea)    Mild w AHI 14/hr now on CPAP at 9cm H2O  . PONV (postoperative nausea and vomiting)   . Seasonal allergies   . Sleep apnea    cpap setting 70  . SVD (spontaneous vaginal delivery)    x 2    Past Surgical History:  Procedure Laterality Date  . ABDOMINAL HYSTERECTOMY    . carpel tunnel surgery     x 2 - left and right  . Quail   x 1  . CYSTOSCOPY N/A 08/08/2012   Procedure: CYSTOSCOPY;  Surgeon: Marvene Staff, MD;  Location: [facility];  Service: Gynecology;  Laterality: N/A;  . left foot surgery     remove cyst  . ROBOTIC ASSISTED LAPAROSCOPIC LYSIS OF  ADHESION N/A 08/08/2012   Procedure: ROBOTIC ASSISTED LAPAROSCOPIC exctensive LYSIS OF ADHESION;  Surgeon: Marvene Staff, MD;  Location: [facility];  Service: Gynecology;  Laterality: N/A;  . ROBOTIC ASSISTED SALPINGO OOPHERECTOMY Left 08/08/2012   Procedure: ROBOTIC ASSISTED SALPINGO OOPHORECTOMY;  Surgeon: Marvene Staff, MD;  Location: [facility];  Service: Gynecology;  Laterality: Left;  . trigger thumb release     x 2 for right and left thumbs  . WISDOM TOOTH EXTRACTION      There were no vitals filed for this visit.  Subjective Assessment - 05/03/19 0757    Subjective  I did well after last visit.  Feeling pretty good today.    Limitations  Sitting;Standing;Walking;House hold activities;Lifting    How long can you sit comfortably?  30 min    How long can you stand comfortably?  5-10 min    How long can you walk comfortably?  5-10 min    Diagnostic tests  MRI 2019 L3/4 mild disc space narrowing    Patient Stated Goals  sleep through night, get back to walking up to 5 miles/day, get rid of pain  Currently in Pain?  Yes    Pain Score  4     Pain Location  Back    Pain Orientation  Right;Lower    Pain Descriptors / Indicators  Aching;Dull    Pain Type  Chronic pain    Pain Onset  More than a month ago    Pain Frequency  Intermittent    Aggravating Factors   stand/walk, bend    Pain Relieving Factors  heat                       [facility] Adult PT Treatment/Exercise - 05/03/19 0001      Lumbar Exercises: Stretches   Pelvic Tilt  10 reps      Lumbar Exercises: Aerobic   Nustep  6' seat 8 arms 9, L3 PT present to disucss progress      Lumbar Exercises: Standing   Other Standing Lumbar Exercises  20# pulleys walkbacks bil UEs elbows bent x 5 reps, PT cued alignment      Lumbar Exercises: Supine   Clam  15 reps    Clam Limitations  blue band with TrA cueing      Knee/Hip Exercises: Stretches   Piriformis Stretch  Left;1 rep;20 seconds    Piriformis Stretch  Limitations  Pt with pain on Rt, d/c'd    Other Knee/Hip Stretches  figure 4 push knee away supine 2x20 sec, Rt, Lt      Knee/Hip Exercises: Standing   Forward Step Up  2 sets;5 reps;Both;Step Height: 6"    Forward Step Up Limitations  no UE assistance      Knee/Hip Exercises: Seated   Sit to Sand  2 sets;10 reps;with UE support   from black pad in chair, hands on thighs first set only     Knee/Hip Exercises: Supine   Bridges  Strengthening;Both;5 reps    Bridges Limitations  Rt LBP even with TrA and hip abd cue with blue band, switched to hip abd only      Shoulder Exercises: Standing   Extension  Strengthening;Both;Theraband;20 reps    Theraband Level (Shoulder Extension)  Level 2 (Red)    Extension Limitations  2x10 alt with row    Row  Strengthening;Both;20 reps;Theraband    Row Limitations  2x10      Moist Heat Therapy   Number Minutes Moist Heat  10 Minutes    Moist Heat Location  Lumbar Spine      Electrical Stimulation   Electrical Stimulation Location  right low back     Electrical Stimulation Action  IFC    Electrical Stimulation Parameters  45ma 10 min    Electrical Stimulation Goals  Pain   prone with heat and feet propped on bolster     Manual Therapy   Manual Therapy  Soft tissue mobilization    Soft tissue mobilization  Rt hip glutes and piriformis in Lt SL               PT Short Term Goals - 05/03/19 0800      PT SHORT TERM GOAL #1   Title  Pt will be ind in intial HEP and demo proper standing, sitting posture and bed mobility.    Status  On-going      PT SHORT TERM GOAL #2   Title  pt will report less sleep disruption due to pain by at least 25%    Baseline  50% with use of exercise    Status  Achieved        PT Long Term Goals - 04/19/19 1259      PT LONG TERM GOAL #1   Title  Pt will achieve at least 4/5 strength throughout Rt LE to improve functional tasks such as transfers, stairs and walking for exercise.    Time  8    Period   Weeks    Status  New    Target Date  06/14/19      PT LONG TERM GOAL #2   Title  Pt will improve lumbar flexion to enable her to don shoes/socks ind    Time  8    Period  Weeks    Status  New    Target Date  06/14/19      PT LONG TERM GOAL #3   Title  Pt will report reduced sleep disruption due to pain by at least 70%    Time  8    Period  Weeks    Status  New    Target Date  06/14/19      PT LONG TERM GOAL #4   Title  Pt will reduce FOTO to 49% or less to demo less limitations in daily activities.    Time  8    Period  Weeks    Status  New    Target Date  06/14/19      PT LONG TERM GOAL #5   Title  Pt will be ind in HEP including return to walking for exercise at least 3x/week to improve overall mobility, strength and endurance.    Time  8    Period  Weeks    Status  New    Target Date  06/14/19            Plan - 05/03/19 0808    Clinical Impression Statement  Pt reports 50% improvement in pain with exercise.  She is compliant with HEP.  She can now do 10 sit to stands without use of UEs but needs from black pad for eccentric control end of stand to sit.  She demos improving standing posture awareness with core recruitment with only min need for PT VCs for aligning ribcage over pelvis.  She had some pain with attempts to do bridging and stretching of Rt glutes and piriformis so PT modified and performed brief STM on Rt hip today.  If Pt responds well to today's session will update HEP next visit.  Continue along POC.    Comorbidities  obesity, diabetes, high blood pressure, chronic LBP with acute flare up    Rehab Potential  Good    PT Frequency  2x / week    PT Duration  8 weeks    PT Treatment/Interventions  ADLs/Self Care Home Management;Aquatic Therapy;Cryotherapy;Electrical Stimulation;Moist Heat;Iontophoresis 4mg /ml Dexamethasone;Traction;Stair training;Gait training;Functional mobility training;Therapeutic activities;Therapeutic exercise;Neuromuscular  re-education;Manual techniques;Patient/family education;Passive range of motion;Dry needling;Taping;Spinal Manipulations;Joint Manipulations    PT Next Visit Plan  update HEP, continue lumbar stab and LE strength, postural re-ed and strength, lumbar gentle ROM, and endurance    PT Home Exercise Plan  Access Code: [email or web address]    Consulted and Agree with Plan of Care  Patient       Patient will benefit from skilled therapeutic intervention in order to improve the following deficits and impairments:     Visit Diagnosis: Acute midline low back pain with right-sided sciatica  Muscle weakness (generalized)  Abnormal posture     Problem List Patient Active Problem List   Diagnosis Date  Noted  . Bradycardia 09/26/2014  . Hypertension   . Obstructive sleep apnea 05/03/2013  . Obesity 05/27/2006  . ASTHMA, UNSPECIFIED 05/27/2006    Morton Peters, PT 05/03/19 8:40 AM   [facility] [address], [address] [place], [place], [address] Phone: [phone]   Fax:  [phone]  Name: [name] [name] MRN: [id number] Date of Birth: 17-Oct-1965

## 2019-05-05 ENCOUNTER — Other Ambulatory Visit: Payer: Self-pay

## 2019-05-05 ENCOUNTER — Ambulatory Visit: Payer: BC Managed Care – PPO | Admitting: Physical Therapy

## 2019-05-05 ENCOUNTER — Encounter: Payer: Self-pay | Admitting: Physical Therapy

## 2019-05-05 DIAGNOSIS — M6281 Muscle weakness (generalized): Secondary | ICD-10-CM

## 2019-05-05 DIAGNOSIS — M5441 Lumbago with sciatica, right side: Secondary | ICD-10-CM | POA: Diagnosis not present

## 2019-05-05 DIAGNOSIS — R293 Abnormal posture: Secondary | ICD-10-CM

## 2019-05-05 NOTE — Patient Instructions (Signed)
Access Code: [email or web address]  URL: [email or web address]  Date: 05/05/2019  Prepared by: Loistine Simas Connell Bognar   Exercises  Lying Prone - 1x daily - 7x weekly  Standing Back Extension - 10 reps - 3 sets - 1x daily - 7x weekly  Sidelying Transversus Abdominis Bracing - 10 reps - 1 sets - 10 hold - 1x daily - 7x weekly  Prone Press Up - 10 reps - 1 sets - 2 hold - 4x daily - 7x weekly  Sit to Stand with Hands on Knees - 5 reps - 3 sets - 1x daily - 7x weekly  Shoulder Extension with Resistance - 15 reps - 2 sets - 1x daily - 7x weekly  Standing Bilateral Low Shoulder Row with Anchored Resistance - 15 reps - 2 sets - 1x daily - 7x weekly  Seated March with Resistance - 20 reps - 2 sets - 1x daily - 7x weekly  Seated Hip Abduction with Resistance - 20 reps - 2 sets - 1x daily - 7x weekly  Sitting Knee Extension with Resistance - 10 reps - 2 sets - 1x daily - 7x weekly  Seated Table Piriformis Stretch - 3 reps - 3 sets - 30 hold - 1x daily - 7x weekly

## 2019-05-05 NOTE — Therapy (Signed)
[facility] [address], [address] [place], [place], [address] Phone: [phone]   Fax:  [phone]  Physical Therapy Treatment  Patient Details  Name: [name] [name] MRN: [id number] Date of Birth: January 02, 1966 Referring Provider (PT): Darrow Bussing, MD   Encounter Date: 05/05/2019  PT End of Session - 05/05/19 0806    Visit Number  6    Date for PT Re-Evaluation  06/14/19    Authorization Type  BCBS 30 visit limit    Authorization - Visit Number  6    Authorization - Number of Visits  30    PT Start Time  0800    PT Stop Time  0850   10 min stim and heat end of session   PT Time Calculation (min)  50 min    Activity Tolerance  Patient tolerated treatment well    Behavior During Therapy  [facility] for tasks assessed/performed       Past Medical History:  Diagnosis Date  . Anemia    history  . Anxiety   . Arthritis    lower back, knee  . Asthma   . Bradycardia 09/26/2014  . Depression   . Diabetes mellitus without complication ([facility])   . Esophageal reflux   . GERD (gastroesophageal reflux disease)   . H/O gestational diabetes mellitus, not currently pregnant   . Headache(784.0)    on rx med  . Hyperlipidemia    diet controlled  . Hypertension   . Mental disorder   . Migraines   . Obesity   . OSA (obstructive sleep apnea)    Mild w AHI 14/hr now on CPAP at 9cm H2O  . PONV (postoperative nausea and vomiting)   . Seasonal allergies   . Sleep apnea    cpap setting 70  . SVD (spontaneous vaginal delivery)    x 2    Past Surgical History:  Procedure Laterality Date  . ABDOMINAL HYSTERECTOMY    . carpel tunnel surgery     x 2 - left and right  . CESAREAN SECTION  1987   x 1  . CYSTOSCOPY N/A 08/08/2012   Procedure: CYSTOSCOPY;  Surgeon: Serita Kyle, MD;  Location: [facility];  Service: Gynecology;  Laterality: N/A;  . left foot surgery     remove cyst  . ROBOTIC ASSISTED LAPAROSCOPIC LYSIS  OF ADHESION N/A 08/08/2012   Procedure: ROBOTIC ASSISTED LAPAROSCOPIC exctensive LYSIS OF ADHESION;  Surgeon: Serita Kyle, MD;  Location: [facility];  Service: Gynecology;  Laterality: N/A;  . ROBOTIC ASSISTED SALPINGO OOPHERECTOMY Left 08/08/2012   Procedure: ROBOTIC ASSISTED SALPINGO OOPHORECTOMY;  Surgeon: Serita Kyle, MD;  Location: [facility];  Service: Gynecology;  Laterality: Left;  . trigger thumb release     x 2 for right and left thumbs  . WISDOM TOOTH EXTRACTION      There were no vitals filed for this visit.  Subjective Assessment - 05/05/19 0804    Subjective  Still about 50% better.  5/10 pain this morning.    Limitations  Sitting;Standing;Walking;House hold activities;Lifting    How long can you sit comfortably?  30 min    How long can you stand comfortably?  5-10 min    How long can you walk comfortably?  5-10 min    Diagnostic tests  MRI 2019 L3/4 mild disc space narrowing    Patient Stated Goals  sleep through night, get back to walking up to 5 miles/day, get rid of pain  Currently in Pain?  Yes    Pain Score  5     Pain Location  Back    Pain Orientation  Right;Lower    Pain Descriptors / Indicators  Aching;Dull    Pain Type  Chronic pain    Pain Onset  More than a month ago    Pain Frequency  Intermittent    Aggravating Factors   stand/walk, bend    Pain Relieving Factors  heat, TENS, HEP    Effect of Pain on Daily Activities  starting to stand for cooking prep                       [facility] Adult PT Treatment/Exercise - 05/05/19 0001      Exercises   Exercises  Shoulder;Lumbar;Knee/Hip      Lumbar Exercises: Aerobic   Nustep  6' seat 8 arms 9, L3 PT present to disucss progress      Lumbar Exercises: Standing   Heel Raises  15 reps      Knee/Hip Exercises: Stretches   Piriformis Stretch  Right;1 rep;20 seconds    Piriformis Stretch Limitations  edge of mat table, lean forward over knee    Other Knee/Hip Stretches  figure 4 push  knee away supine 2x20 sec, Rt, Lt      Knee/Hip Exercises: Standing   Hip Abduction  Stengthening;AROM;Both;10 reps;Knee straight    Hip Extension  Stengthening;Both;AROM;2 sets;10 reps;Knee straight      Knee/Hip Exercises: Seated   Long Arc Quad  Strengthening;Both;2 sets;10 reps;Weights    Long Arc Quad Weight  2 lbs.    Clamshell with TheraBand  Blue   1x20   Marching  Strengthening;1 set;Both;20 reps    Sit to [employer]  10 reps;without UE support   from black pad     Shoulder Exercises: ROM/Strengthening   UBE (Upper Arm Bike)  L2 2x2 fwd/bwd standing with core cueing by PT      Moist Heat Therapy   Number Minutes Moist Heat  10 Minutes    Moist Heat Location  Lumbar Spine      Electrical Stimulation   Electrical Stimulation Location  right low back     Electrical Stimulation Action  IFC    Electrical Stimulation Parameters  3ma 10 min    Electrical Stimulation Goals  Pain   prone with heat and feet propped on bolster            PT Education - 05/05/19 0836    Education Details  Access Code: [email or web address]    Person(s) Educated  Patient    Methods  Explanation;Demonstration;Verbal cues;Handout    Comprehension  Verbalized understanding;Returned demonstration       PT Short Term Goals - 05/05/19 0840      PT SHORT TERM GOAL #1   Title  Pt will be ind in intial HEP and demo proper standing, sitting posture and bed mobility.    Status  Achieved      PT SHORT TERM GOAL #2   Title  pt will report less sleep disruption due to pain by at least 25%    Status  Achieved        PT Long Term Goals - 05/05/19 0840      PT LONG TERM GOAL #1   Title  Pt will achieve at least 4/5 strength throughout Rt LE to improve functional tasks such as transfers, stairs and walking for exercise.    Status  On-going  PT LONG TERM GOAL #2   Title  Pt will improve lumbar flexion to enable her to don shoes/socks ind    Status  On-going      PT LONG TERM GOAL #3   Title  Pt will  report reduced sleep disruption due to pain by at least 70%    Baseline  50%    Status  On-going      PT LONG TERM GOAL #5   Title  Pt will be ind in HEP including return to walking for exercise at least 3x/week to improve overall mobility, strength and endurance.    Status  On-going            Plan - 05/05/19 9030    Clinical Impression Statement  Pt continues to report 50% improvement in sleep and daily pain since starting PT.  PT progressed seated ther ex for HEP and found a piriformis stretch for Rt hip that was more comfortable for Pt today (edge of mat table, lean forward over bent knee).  PT also trialed standing hip abd and ext and heel raises but Pt reported increased Rt hip and back pain so did not progress this into HEP at this time.  Pt demo'd improved eccentric control in stand to sit today without need for black pad.  PT cued slower march in seated with resistance exercise to connect more with core.  Pt will continue to benefit from skilled PT along POC.    Comorbidities  obesity, diabetes, high blood pressure, chronic LBP with acute flare up    Rehab Potential  Good    PT Frequency  2x / week    PT Duration  8 weeks    PT Treatment/Interventions  ADLs/Self Care Home Management;Aquatic Therapy;Cryotherapy;Electrical Stimulation;Moist Heat;Iontophoresis 4mg /ml Dexamethasone;Traction;Stair training;Gait training;Functional mobility training;Therapeutic activities;Therapeutic exercise;Neuromuscular re-education;Manual techniques;Patient/family education;Passive range of motion;Dry needling;Taping;Spinal Manipulations;Joint Manipulations    PT Next Visit Plan  f/u on seated ther ex HEP, Rt hip stretching and STM, continue lumbar stab, endurance, LE strength, UE row    PT Home Exercise Plan  Access Code: [email or web address]    Consulted and Agree with Plan of Care  Patient       Patient will benefit from skilled therapeutic intervention in order to improve the following deficits and  impairments:  Abnormal gait, Decreased range of motion, Decreased endurance, Increased muscle spasms, Obesity, Pain, Decreased activity tolerance, Hypomobility, Impaired flexibility, Improper body mechanics, Postural dysfunction, Decreased mobility, Decreased strength  Visit Diagnosis: Acute midline low back pain with right-sided sciatica  Muscle weakness (generalized)  Abnormal posture     Problem List Patient Active Problem List   Diagnosis Date Noted  . Bradycardia 09/26/2014  . Hypertension   . Obstructive sleep apnea 05/03/2013  . Obesity 05/27/2006  . ASTHMA, UNSPECIFIED 05/27/2006    Baruch Merl, PT 05/05/19 8:43 AM   [facility] [address] [place], [place], [address] Phone: [phone]   Fax:  [phone]  Name: HATTIE PINE MRN: [id number] Date of Birth: 04-03-1965

## 2019-05-09 ENCOUNTER — Other Ambulatory Visit: Payer: Self-pay

## 2019-05-09 ENCOUNTER — Encounter: Payer: Self-pay | Admitting: Physical Therapy

## 2019-05-09 ENCOUNTER — Ambulatory Visit: Payer: BC Managed Care – PPO | Admitting: Physical Therapy

## 2019-05-09 DIAGNOSIS — M5441 Lumbago with sciatica, right side: Secondary | ICD-10-CM

## 2019-05-09 DIAGNOSIS — M6281 Muscle weakness (generalized): Secondary | ICD-10-CM

## 2019-05-09 DIAGNOSIS — R293 Abnormal posture: Secondary | ICD-10-CM

## 2019-05-09 NOTE — Therapy (Signed)
[facility] [address] [place], [place], [address] Phone: [phone]   Fax:  [phone]  Physical Therapy Treatment  Patient Details  Name: [name] [name] MRN: [id number] Date of Birth: 07-Oct-1965 Referring Provider (PT): Darrow Bussing, MD   Encounter Date: 05/09/2019  PT End of Session - 05/09/19 1416    Visit Number  7    Date for PT Re-Evaluation  06/14/19    Authorization Type  BCBS 30 visit limit    Authorization - Visit Number  7    Authorization - Number of Visits  30    PT Start Time  0805    PT Stop Time  0850    PT Time Calculation (min)  45 min    Activity Tolerance  Patient tolerated treatment well       Past Medical History:  Diagnosis Date  . Anemia    history  . Anxiety   . Arthritis    lower back, knee  . Asthma   . Bradycardia 09/26/2014  . Depression   . Diabetes mellitus without complication (HCC)   . Esophageal reflux   . GERD (gastroesophageal reflux disease)   . H/O gestational diabetes mellitus, not currently pregnant   . Headache(784.0)    on rx med  . Hyperlipidemia    diet controlled  . Hypertension   . Mental disorder   . Migraines   . Obesity   . OSA (obstructive sleep apnea)    Mild w AHI 14/hr now on CPAP at 9cm H2O  . PONV (postoperative nausea and vomiting)   . Seasonal allergies   . Sleep apnea    cpap setting 70  . SVD (spontaneous vaginal delivery)    x 2    Past Surgical History:  Procedure Laterality Date  . ABDOMINAL HYSTERECTOMY    . carpel tunnel surgery     x 2 - left and right  . CESAREAN SECTION  1987   x 1  . CYSTOSCOPY N/A 08/08/2012   Procedure: CYSTOSCOPY;  Surgeon: Serita Kyle, MD;  Location: [facility];  Service: Gynecology;  Laterality: N/A;  . left foot surgery     remove cyst  . ROBOTIC ASSISTED LAPAROSCOPIC LYSIS OF ADHESION N/A 08/08/2012   Procedure: ROBOTIC ASSISTED LAPAROSCOPIC exctensive LYSIS OF ADHESION;   Surgeon: Serita Kyle, MD;  Location: [facility];  Service: Gynecology;  Laterality: N/A;  . ROBOTIC ASSISTED SALPINGO OOPHERECTOMY Left 08/08/2012   Procedure: ROBOTIC ASSISTED SALPINGO OOPHORECTOMY;  Surgeon: Serita Kyle, MD;  Location: [facility];  Service: Gynecology;  Laterality: Left;  . trigger thumb release     x 2 for right and left thumbs  . WISDOM TOOTH EXTRACTION      There were no vitals filed for this visit.  Subjective Assessment - 05/09/19 0809    Subjective  My wrist is bothering me from holding the band too tight.  I got the TENS unit but I don't have a battery for it.   I'm surprised PT has helped, I didn't think it would.    Diagnostic tests  MRI 2019 L3/4 mild disc space narrowing    Currently in Pain?  Yes    Pain Score  3     Pain Location  Back    Pain Orientation  Right;Lower    Pain Type  Chronic pain  [facility] Adult PT Treatment/Exercise - 05/09/19 0001      Lumbar Exercises: Stretches   Hip Flexor Stretch  Right;Left;5 reps    Hip Flexor Stretch Limitations  on 2nd step with UE elevation and sidebend       Lumbar Exercises: Aerobic   Stationary Bike  3 min while discussing status       Lumbar Exercises: Standing   Heel Raises  15 reps      Knee/Hip Exercises: Stretches   Piriformis Stretch  Right;1 rep;20 seconds    Piriformis Stretch Limitations  edge of mat table, lean forward over knee      Knee/Hip Exercises: Standing   Hip Abduction  Stengthening;AROM;Both;10 reps;Knee straight    Abduction Limitations  red band     Hip Extension  Stengthening;Both;AROM;2 sets;10 reps;Knee straight    Extension Limitations  red band     Forward Step Up  Both;2 sets;10 reps;Step Height: 6"    Forward Step Up Limitations  no UE assistance      Knee/Hip Exercises: Seated   Sit to Sand  10 reps;without UE support   black pad not needed today     Moist Heat Therapy   Number Minutes Moist Heat  10 Minutes    Moist  Heat Location  Lumbar Spine      Electrical Stimulation   Electrical Stimulation Location  bil low back     Electrical Stimulation Action  IFC    Electrical Stimulation Parameters  25 ma 10 min supine     Electrical Stimulation Goals  Pain               PT Short Term Goals - 05/09/19 1422      PT SHORT TERM GOAL #1   Title  Pt will be ind in intial HEP and demo proper standing, sitting posture and bed mobility.    Status  Achieved      PT SHORT TERM GOAL #2   Title  pt will report less sleep disruption due to pain by at least 25%    Status  Achieved        PT Long Term Goals - 05/05/19 0840      PT LONG TERM GOAL #1   Title  Pt will achieve at least 4/5 strength throughout Rt LE to improve functional tasks such as transfers, stairs and walking for exercise.    Status  On-going      PT LONG TERM GOAL #2   Title  Pt will improve lumbar flexion to enable her to don shoes/socks ind    Status  On-going      PT LONG TERM GOAL #3   Title  Pt will report reduced sleep disruption due to pain by at least 70%    Baseline  50%    Status  On-going      PT LONG TERM GOAL #5   Title  Pt will be ind in HEP including return to walking for exercise at least 3x/week to improve overall mobility, strength and endurance.    Status  On-going            Plan - 05/09/19 1417    Clinical Impression Statement  The patient demonstrates much improved spinal mobility and exercise capacity.  Her back subjective pain rating is much lower than on previous visits.  She does complain of left wrist pain secondary to wrapping her resistive band around her wrist.  Ex's had to be modified secondary to wrist discomfort  but she was able to progress with LE/lumbar core ex's including lower surface height with sit to stand and added resistance with standing hip raises.  On track to meet goals.  Therapist closely monitoring response and modifying as needed.    Comorbidities  obesity, diabetes, high  blood pressure, chronic LBP with acute flare up    Rehab Potential  Good    PT Frequency  2x / week    PT Duration  8 weeks    PT Treatment/Interventions  ADLs/Self Care Home Management;Aquatic Therapy;Cryotherapy;Electrical Stimulation;Moist Heat;Iontophoresis 4mg /ml Dexamethasone;Traction;Stair training;Gait training;Functional mobility training;Therapeutic activities;Therapeutic exercise;Neuromuscular re-education;Manual techniques;Patient/family education;Passive range of motion;Dry needling;Taping;Spinal Manipulations;Joint Manipulations    PT Next Visit Plan  f/u on seated ther ex HEP, Rt hip stretching and STM, continue lumbar stab, endurance, LE strength; patient to bring in her band (add cardboard tubes to make handles)    PT Home Exercise Plan  Access Code: [email or web address]       Patient will benefit from skilled therapeutic intervention in order to improve the following deficits and impairments:  Abnormal gait, Decreased range of motion, Decreased endurance, Increased muscle spasms, Obesity, Pain, Decreased activity tolerance, Hypomobility, Impaired flexibility, Improper body mechanics, Postural dysfunction, Decreased mobility, Decreased strength  Visit Diagnosis: Acute midline low back pain with right-sided sciatica  Muscle weakness (generalized)  Abnormal posture     Problem List Patient Active Problem List   Diagnosis Date Noted  . Bradycardia 09/26/2014  . Hypertension   . Obstructive sleep apnea 05/03/2013  . Obesity 05/27/2006  . ASTHMA, UNSPECIFIED 05/27/2006   05/29/2006, PT 05/09/19 2:24 PM Phone: [phone] Fax: [phone] 05/09/2019, 2:23 PM  [facility] [address], [address] [place], [place], [place] Phone: [phone]   Fax:  [phone]  Name: NISHAT LIVINGSTON MRN: Nicholes Calamity Date of Birth: [birth year]

## 2019-05-11 ENCOUNTER — Ambulatory Visit: Payer: BC Managed Care – PPO | Admitting: Physical Therapy

## 2019-05-15 ENCOUNTER — Ambulatory Visit: Payer: BC Managed Care – PPO | Admitting: Physical Therapy

## 2019-05-17 ENCOUNTER — Encounter: Payer: Self-pay | Admitting: Physical Therapy

## 2019-05-17 ENCOUNTER — Other Ambulatory Visit: Payer: Self-pay

## 2019-05-17 ENCOUNTER — Ambulatory Visit: Payer: BC Managed Care – PPO | Admitting: Physical Therapy

## 2019-05-17 DIAGNOSIS — M5441 Lumbago with sciatica, right side: Secondary | ICD-10-CM

## 2019-05-17 DIAGNOSIS — R293 Abnormal posture: Secondary | ICD-10-CM

## 2019-05-17 DIAGNOSIS — M6281 Muscle weakness (generalized): Secondary | ICD-10-CM

## 2019-05-17 NOTE — Therapy (Signed)
[facility] [address], [place], [place], [address] Phone: [phone]   Fax:  [phone]  Physical Therapy Treatment  Patient Details  Name: [name] [name] MRN: [id number] Date of Birth: 07/06/1965 Referring Provider (PT): Lujean Amel, MD   Encounter Date: 05/17/2019  PT End of Session - 05/17/19 0855    Visit Number  8    Date for PT Re-Evaluation  06/14/19    Authorization Type  BCBS 30 visit limit    Authorization - Visit Number  8    Authorization - Number of Visits  30    PT Start Time  [phone]    PT Stop Time  0930    PT Time Calculation (min)  43 min    Activity Tolerance  Patient tolerated treatment well    Behavior During Therapy  [facility] for tasks assessed/performed       Past Medical History:  Diagnosis Date  . Anemia    history  . Anxiety   . Arthritis    lower back, knee  . Asthma   . Bradycardia 09/26/2014  . Depression   . Diabetes mellitus without complication ([place])   . Esophageal reflux   . GERD (gastroesophageal reflux disease)   . H/O gestational diabetes mellitus, not currently pregnant   . Headache(784.0)    on rx med  . Hyperlipidemia    diet controlled  . Hypertension   . Mental disorder   . Migraines   . Obesity   . OSA (obstructive sleep apnea)    Mild w AHI 14/hr now on CPAP at 9cm H2O  . PONV (postoperative nausea and vomiting)   . Seasonal allergies   . Sleep apnea    cpap setting 70  . SVD (spontaneous vaginal delivery)    x 2    Past Surgical History:  Procedure Laterality Date  . ABDOMINAL HYSTERECTOMY    . carpel tunnel surgery     x 2 - left and right  . [place]   x 1  . CYSTOSCOPY N/A 08/08/2012   Procedure: CYSTOSCOPY;  Surgeon: Marvene Staff, MD;  Location: [facility];  Service: Gynecology;  Laterality: N/A;  . left foot surgery     remove cyst  . ROBOTIC ASSISTED LAPAROSCOPIC LYSIS OF ADHESION N/A 08/08/2012   Procedure: ROBOTIC ASSISTED LAPAROSCOPIC exctensive LYSIS OF ADHESION;  Surgeon: Marvene Staff, MD;  Location: [facility];  Service: Gynecology;  Laterality: N/A;  . ROBOTIC ASSISTED SALPINGO OOPHERECTOMY Left 08/08/2012   Procedure: ROBOTIC ASSISTED SALPINGO OOPHORECTOMY;  Surgeon: Marvene Staff, MD;  Location: [facility];  Service: Gynecology;  Laterality: Left;  . trigger thumb release     x 2 for right and left thumbs  . WISDOM TOOTH EXTRACTION      There were no vitals filed for this visit.  Subjective Assessment - 05/17/19 0849    Subjective  The more exercise I do the better I feel.  I have no pain today.    Limitations  Sitting;Standing;Walking;House hold activities;Lifting    How long can you sit comfortably?  30 min    How long can you stand comfortably?  5-10 min    How long can you walk comfortably?  5-10 min    Diagnostic tests  MRI 2019 L3/4 mild disc space narrowing    Patient Stated Goals  sleep through night, get back to walking up to 5 miles/day, get rid of pain    Currently in  Pain?  No/denies    Pain Onset  More than a month ago                       [facility] Adult PT Treatment/Exercise - 05/17/19 0001      Self-Care   Other Self-Care Comments   return to walking program, 5 min warm up/cool down, brisk walk on straightaways and slower on curves of track, build time and laps gradually      Lumbar Exercises: Stretches   Active Hamstring Stretch  Left;Right;1 rep;30 seconds    Active Hamstring Stretch Limitations  foot on second step    Hip Flexor Stretch  Left;Right    Hip Flexor Stretch Limitations  on 2nd step with UE elevation and sidebend       Lumbar Exercises: Aerobic   Nustep  L3 with towel roll x 11', PT present to do FOTO and goal review      Knee/Hip Exercises: Standing   Forward Step Up  Both;Step Height: 6";Hand Hold: 1;Left;Right;2 sets;10 reps    Forward Step Up Limitations  with contralateral march    Functional Squat  Limitations  to black pad in chair 2x10      Knee/Hip Exercises: Seated   Long Arc Quad  Strengthening;Both;2 sets;10 reps;Weights    Long Arc Quad Weight  3 lbs.    Long [employer] Limitations  seated on dynadisc for core challenge      Shoulder Exercises: Standing   Extension  Strengthening;Both;15 reps;Theraband    Theraband Level (Shoulder Extension)  Level 3 (Green)    Row  Strengthening;Both;15 reps;Theraband    Theraband Level (Shoulder Row)  Level 3 (Green)      Shoulder Exercises: ROM/Strengthening   UBE (Upper Arm Bike)  L3 2x2 with core and postural awareness, use of towel roll               PT Short Term Goals - 05/17/19 5409      PT SHORT TERM GOAL #1   Title  Pt will be ind in intial HEP and demo proper standing, sitting posture and bed mobility.    Status  Achieved      PT SHORT TERM GOAL #2   Title  pt will report less sleep disruption due to pain by at least 25%    Status  Achieved        PT Long Term Goals - 05/17/19 8119      PT LONG TERM GOAL #1   Title  Pt will achieve at least 4/5 strength throughout Rt LE to improve functional tasks such as transfers, stairs and walking for exercise.    Status  On-going      PT LONG TERM GOAL #2   Title  Pt will improve lumbar flexion to enable her to don shoes/socks ind    Status  Achieved      PT LONG TERM GOAL #3   Title  Pt will report reduced sleep disruption due to pain by at least 70%    Status  Achieved      PT LONG TERM GOAL #4   Title  Pt will reduce FOTO to 49% or less to demo less limitations in daily activities.    Baseline  28%    Status  Achieved      PT LONG TERM GOAL #5   Title  Pt will be ind in HEP including return to walking for exercise at least 3x/week to improve overall  mobility, strength and endurance.    Status  On-going            Plan - 05/17/19 0172    Clinical Impression Statement  Pt with signif reduction in pain and function with PT.  She has met most LTGs  including improved sleep and daily task performance with little to no pain.  She had some pain on attempt to return to walking for exercise so PT counseled her on a safe return to walking program with warm up, cool down and intervals of brisk walking.  Pt with improved exercise tolerance and postural strength and awareness with min need for cueing in standing today.  PT and Pt agreed to stretch out visits to 1x/week since Pt doing so well.  Pt on track to meet goals within this certifcaiton period.  Finalize HEP over next 2 visits with potential d/c afterwards.    Comorbidities  obesity, diabetes, high blood pressure, chronic LBP with acute flare up    PT Frequency  2x / week    PT Duration  8 weeks    PT Treatment/Interventions  ADLs/Self Care Home Management;Aquatic Therapy;Cryotherapy;Electrical Stimulation;Moist Heat;Iontophoresis 74m/ml Dexamethasone;Traction;Stair training;Gait training;Functional mobility training;Therapeutic activities;Therapeutic exercise;Neuromuscular re-education;Manual techniques;Patient/family education;Passive range of motion;Dry needling;Taping;Spinal Manipulations;Joint Manipulations    PT Next Visit Plan  taper to 1x/week, continue progressing strength and endurance, f/u on walking program return    PT Home Exercise Plan  Access Code: [email or web address]    Consulted and Agree with Plan of Care  Patient       Patient will benefit from skilled therapeutic intervention in order to improve the following deficits and impairments:     Visit Diagnosis: Acute midline low back pain with right-sided sciatica  Muscle weakness (generalized)  Abnormal posture     Problem List Patient Active Problem List   Diagnosis Date Noted  . Bradycardia 09/26/2014  . Hypertension   . Obstructive sleep apnea 05/03/2013  . Obesity 05/27/2006  . ASTHMA, UNSPECIFIED 05/27/2006    JBaruch Merl PT 05/17/19 9:30 AM   [facility] [address] [place] [address]: [phone]  Fax:  [phone] Name: LRAILEIGH SABATERMRN: [id number] of Birth: 107-Dec-1967

## 2019-05-22 ENCOUNTER — Other Ambulatory Visit: Payer: Self-pay

## 2019-05-22 ENCOUNTER — Encounter: Payer: Self-pay | Admitting: Physical Therapy

## 2019-05-22 ENCOUNTER — Ambulatory Visit: Payer: BC Managed Care – PPO | Admitting: Physical Therapy

## 2019-05-22 DIAGNOSIS — M5441 Lumbago with sciatica, right side: Secondary | ICD-10-CM

## 2019-05-22 DIAGNOSIS — M6281 Muscle weakness (generalized): Secondary | ICD-10-CM

## 2019-05-22 DIAGNOSIS — R293 Abnormal posture: Secondary | ICD-10-CM

## 2019-05-22 NOTE — Therapy (Signed)
[facility] [address] [place], [place], [address] Phone: [phone]   Fax:  [phone]  Physical Therapy Treatment  Patient Details  Name: [name] [name] MRN: [id number] Date of Birth: 1965/07/04 Referring Provider (PT): Darrow Bussing, MD   Encounter Date: 05/22/2019  PT End of Session - 05/22/19 0853    Visit Number  9    Date for PT Re-Evaluation  06/14/19    Authorization Type  BCBS 30 visit limit    Authorization - Visit Number  9    Authorization - Number of Visits  30    PT Start Time  0847    PT Stop Time  0926    PT Time Calculation (min)  39 min    Activity Tolerance  Patient tolerated treatment well;Patient limited by pain    Behavior During Therapy  [facility] for tasks assessed/performed       Past Medical History:  Diagnosis Date  . Anemia    history  . Anxiety   . Arthritis    lower back, knee  . Asthma   . Bradycardia 09/26/2014  . Depression   . Diabetes mellitus without complication (HCC)   . Esophageal reflux   . GERD (gastroesophageal reflux disease)   . H/O gestational diabetes mellitus, not currently pregnant   . Headache(784.0)    on rx med  . Hyperlipidemia    diet controlled  . Hypertension   . Mental disorder   . Migraines   . Obesity   . OSA (obstructive sleep apnea)    Mild w AHI 14/hr now on CPAP at 9cm H2O  . PONV (postoperative nausea and vomiting)   . Seasonal allergies   . Sleep apnea    cpap setting 70  . SVD (spontaneous vaginal delivery)    x 2    Past Surgical History:  Procedure Laterality Date  . ABDOMINAL HYSTERECTOMY    . carpel tunnel surgery     x 2 - left and right  . CESAREAN SECTION  1987   x 1  . CYSTOSCOPY N/A 08/08/2012   Procedure: CYSTOSCOPY;  Surgeon: Serita Kyle, MD;  Location: [facility];  Service: Gynecology;  Laterality: N/A;  . left foot surgery     remove cyst  . ROBOTIC ASSISTED LAPAROSCOPIC LYSIS OF ADHESION  N/A 08/08/2012   Procedure: ROBOTIC ASSISTED LAPAROSCOPIC exctensive LYSIS OF ADHESION;  Surgeon: Serita Kyle, MD;  Location: [facility];  Service: Gynecology;  Laterality: N/A;  . ROBOTIC ASSISTED SALPINGO OOPHERECTOMY Left 08/08/2012   Procedure: ROBOTIC ASSISTED SALPINGO OOPHORECTOMY;  Surgeon: Serita Kyle, MD;  Location: [facility];  Service: Gynecology;  Laterality: Left;  . trigger thumb release     x 2 for right and left thumbs  . WISDOM TOOTH EXTRACTION      There were no vitals filed for this visit.  Subjective Assessment - 05/22/19 0850    Subjective  I got flared up after last visit.  It hurts to bend backwards or stand too long.  TENS didn't help.  I had to take a pain pill.    Limitations  Sitting;Standing;Walking;House hold activities;Lifting    How long can you sit comfortably?  30 min    How long can you stand comfortably?  5-10 min    How long can you walk comfortably?  5-10 min    Diagnostic tests  MRI 2019 L3/4 mild disc space narrowing    Currently in Pain?  Yes  Pain Score  9     Pain Location  Back    Pain Orientation  Right;Mid    Pain Descriptors / Indicators  Aching    Pain Type  Chronic pain    Pain Onset  More than a month ago    Pain Frequency  Constant    Aggravating Factors   walk, bend backwards    Pain Relieving Factors  laying down                       [facility] Adult PT Treatment/Exercise - 05/22/19 0001      Lumbar Exercises: Stretches   Active Hamstring Stretch  Left;Right;30 seconds    Active Hamstring Stretch Limitations  oscillations for nerve glides bil x 30    Single Knee to Chest Stretch  Left;Right;3 reps;10 seconds    Pelvic Tilt  10 reps;5 seconds    Pelvic Tilt Limitations  TrA cueing by PT    Piriformis Stretch  Right;30 seconds    Piriformis Stretch Limitations  oscillating    Other Lumbar Stretch Exercise  seated green ball rollouts for lumbar flexion x 10, slow controlled with TrA      Lumbar Exercises:  Aerobic   Nustep  L1 with towel roll, 8 min, PT present to monitor symptoms and discuss flare up experience      Lumbar Exercises: Supine   Large Ball Abdominal Isometric  10 reps;5 seconds    Large Ball Oblique Isometric  5 seconds;5 reps    Large Ball Oblique Isometric Limitations  both      Manual Therapy   Manual Therapy  Taping    Kinesiotex  Facilitate Muscle      Kinesiotix   Facilitate Muscle   star formation 4 I strips Rt L5/S1 facet region               PT Short Term Goals - 05/17/19 2641      PT SHORT TERM GOAL #1   Title  Pt will be ind in intial HEP and demo proper standing, sitting posture and bed mobility.    Status  Achieved      PT SHORT TERM GOAL #2   Title  pt will report less sleep disruption due to pain by at least 25%    Status  Achieved        PT Long Term Goals - 05/17/19 5830      PT LONG TERM GOAL #1   Title  Pt will achieve at least 4/5 strength throughout Rt LE to improve functional tasks such as transfers, stairs and walking for exercise.    Status  On-going      PT LONG TERM GOAL #2   Title  Pt will improve lumbar flexion to enable her to don shoes/socks ind    Status  Achieved      PT LONG TERM GOAL #3   Title  Pt will report reduced sleep disruption due to pain by at least 70%    Status  Achieved      PT LONG TERM GOAL #4   Title  Pt will reduce FOTO to 49% or less to demo less limitations in daily activities.    Baseline  28%    Status  Achieved      PT LONG TERM GOAL #5   Title  Pt will be ind in HEP including return to walking for exercise at least 3x/week to improve overall mobility, strength and endurance.  Status  On-going            Plan - 05/22/19 1044    Clinical Impression Statement  Pt arrived with report of a flare up of pain since last visit.  Pt trialed heat and TENS which did not help so she "had to take a pain pill."  She reported Rt>Lt LBP rated 9/10.  She has increased pain with standing lumbar  extension and prolonged standing.  Based on this pain pattern, PT had patient performed gentle flexion based stretching and ROM along with hip and abdominal isometrics, mostly in supine.  PT also taped star pattern over Rt L5/S1 region for muscle facilitation to wear as tol between now and next visit.  PT to continue to monitor response to treatment and pain levels next session.    Comorbidities  obesity, diabetes, high blood pressure, chronic LBP with acute flare up    Stability/Clinical Decision Making  Stable/Uncomplicated    Rehab Potential  Good    PT Frequency  2x / week    PT Duration  8 weeks    PT Treatment/Interventions  ADLs/Self Care Home Management;Aquatic Therapy;Cryotherapy;Electrical Stimulation;Moist Heat;Iontophoresis 4mg /ml Dexamethasone;Traction;Stair training;Gait training;Functional mobility training;Therapeutic activities;Therapeutic exercise;Neuromuscular re-education;Manual techniques;Patient/family education;Passive range of motion;Dry needling;Taping;Spinal Manipulations;Joint Manipulations    PT Next Visit Plan  f/u on star pattern kinesotape, pain levels, progress as tol, trial heat/TENS and manual therapy if not better    PT Home Exercise Plan  Access Code: [email or web address]    Consulted and Agree with Plan of Care  Patient       Patient will benefit from skilled therapeutic intervention in order to improve the following deficits and impairments:     Visit Diagnosis: Acute midline low back pain with right-sided sciatica  Muscle weakness (generalized)  Abnormal posture     Problem List Patient Active Problem List   Diagnosis Date Noted  . Bradycardia 09/26/2014  . Hypertension   . Obstructive sleep apnea 05/03/2013  . Obesity 05/27/2006  . ASTHMA, UNSPECIFIED 05/27/2006    Baruch Merl, PT 05/22/19 10:50 AM   [facility] [address], [place], [place], [address] Phone: [phone]   Fax:   [phone]  Name: [name] [name] MRN: [id number] Date of Birth: 06/22/1965

## 2019-05-24 ENCOUNTER — Other Ambulatory Visit: Payer: Self-pay

## 2019-05-24 ENCOUNTER — Ambulatory Visit: Payer: BC Managed Care – PPO | Admitting: Physical Therapy

## 2019-05-24 ENCOUNTER — Encounter: Payer: Self-pay | Admitting: Physical Therapy

## 2019-05-24 DIAGNOSIS — M5441 Lumbago with sciatica, right side: Secondary | ICD-10-CM

## 2019-05-24 DIAGNOSIS — M6281 Muscle weakness (generalized): Secondary | ICD-10-CM

## 2019-05-24 DIAGNOSIS — R293 Abnormal posture: Secondary | ICD-10-CM

## 2019-05-24 NOTE — Therapy (Signed)
[facility] [address], [place], [place], [address] Phone: [phone]   Fax:  [phone]  Physical Therapy Treatment  Patient Details  Name: [name] [name] MRN: [id number] Date of Birth: [birth year] Referring Provider (PT): Lujean Amel, MD   Encounter Date: 05/24/2019  PT End of Session - 05/24/19 0846    Visit Number  10    Date for PT Re-Evaluation  06/14/19    Authorization Type  BCBS 30 visit limit    Authorization - Visit Number  10    Authorization - Number of Visits  30    PT Start Time  [phone]    PT Stop Time  0930    PT Time Calculation (min)  47 min    Activity Tolerance  Patient tolerated treatment well    Behavior During Therapy  [facility] for tasks assessed/performed       Past Medical History:  Diagnosis Date  . Anemia    history  . Anxiety   . Arthritis    lower back, knee  . Asthma   . Bradycardia 09/26/2014  . Depression   . Diabetes mellitus without complication ([place])   . Esophageal reflux   . GERD (gastroesophageal reflux disease)   . H/O gestational diabetes mellitus, not currently pregnant   . Headache(784.0)    on rx med  . Hyperlipidemia    diet controlled  . Hypertension   . Mental disorder   . Migraines   . Obesity   . OSA (obstructive sleep apnea)    Mild w AHI 14/hr now on CPAP at 9cm H2O  . PONV (postoperative nausea and vomiting)   . Seasonal allergies   . Sleep apnea    cpap setting 70  . SVD (spontaneous vaginal delivery)    x 2    Past Surgical History:  Procedure Laterality Date  . ABDOMINAL HYSTERECTOMY    . carpel tunnel surgery     x 2 - left and right  . [place]   x 1  . CYSTOSCOPY N/A 08/08/2012   Procedure: CYSTOSCOPY;  Surgeon: Marvene Staff, MD;  Location: [facility];  Service: Gynecology;  Laterality: N/A;  . left foot surgery     remove cyst  . ROBOTIC ASSISTED LAPAROSCOPIC LYSIS OF ADHESION N/A 08/08/2012    Procedure: ROBOTIC ASSISTED LAPAROSCOPIC exctensive LYSIS OF ADHESION;  Surgeon: Marvene Staff, MD;  Location: [facility];  Service: Gynecology;  Laterality: N/A;  . ROBOTIC ASSISTED SALPINGO OOPHERECTOMY Left 08/08/2012   Procedure: ROBOTIC ASSISTED SALPINGO OOPHORECTOMY;  Surgeon: Marvene Staff, MD;  Location: [facility];  Service: Gynecology;  Laterality: Left;  . trigger thumb release     x 2 for right and left thumbs  . WISDOM TOOTH EXTRACTION      There were no vitals filed for this visit.  Subjective Assessment - 05/24/19 0842    Subjective  Pain continues to improve from flare up.  The tape came off after a day.    Limitations  Sitting;Standing;Walking;House hold activities;Lifting    How long can you sit comfortably?  30 min    How long can you stand comfortably?  5-10 min    How long can you walk comfortably?  5-10 min    Diagnostic tests  MRI 2019 L3/4 mild disc space narrowing    Patient Stated Goals  sleep through night, get back to walking up to 5 miles/day, get rid of pain    Currently  in Pain?  Yes    Pain Score  5     Pain Location  Back    Pain Orientation  Right    Pain Descriptors / Indicators  Aching    Pain Type  Acute pain;Chronic pain;Other (Comment)   had a flare up recently on chronic pain   Pain Onset  More than a month ago    Pain Frequency  Intermittent    Aggravating Factors   stand, sit x 1 hour without use of lumbar support                       [facility] Adult PT Treatment/Exercise - 05/24/19 0001      Lumbar Exercises: Aerobic   Nustep  L2 seat 8 with lumbar roll x 8', PT present to discuss symptoms      Lumbar Exercises: Standing   Other Standing Lumbar Exercises  yellow tband press downs with TrA and exhale on effort x 15, 5 reps with foot on step bil      Lumbar Exercises: Seated   Long Arc Quad on Chair  Strengthening;Both;2 sets;10 reps    LAQ on Chair Limitations  for Rt neural flossing and quad strength benefits    Sit  to Stand  10 reps    Sit to Stand Limitations  from black pad, PT cued extra focus on TrA in transitions    Other Seated Lumbar Exercises  seated march with green band around knees 2x20 black pad under feet and lumbar roll    Other Seated Lumbar Exercises  seated hip abduction with green band around knees 2x10 with black pad under feet and lumbar roll      Lumbar Exercises: Supine   Isometric Hip Flexion  10 reps;5 seconds    Isometric Hip Flexion Limitations  bil    Large Ball Oblique Isometric  5 seconds;5 reps    Large Ball Oblique Isometric Limitations  both      Modalities   Modalities  Moist Heat;Electrical Stimulation      Moist Heat Therapy   Number Minutes Moist Heat  10 Minutes    Moist Heat Location  Lumbar Spine      Electrical Stimulation   Electrical Stimulation Location  Rt lumbar    Electrical Stimulation Action  IFC    Electrical Stimulation Parameters  10 min    Electrical Stimulation Goals  Pain               PT Short Term Goals - 05/17/19 0272      PT SHORT TERM GOAL #1   Title  Pt will be ind in intial HEP and demo proper standing, sitting posture and bed mobility.    Status  Achieved      PT SHORT TERM GOAL #2   Title  pt will report less sleep disruption due to pain by at least 25%    Status  Achieved        PT Long Term Goals - 05/24/19 0848      PT LONG TERM GOAL #1   Title  Pt will achieve at least 4/5 strength throughout Rt LE to improve functional tasks such as transfers, stairs and walking for exercise.    Status  On-going      PT LONG TERM GOAL #2   Title  Pt will improve lumbar flexion to enable her to don shoes/socks ind    Status  Achieved      PT LONG  TERM GOAL #3   Title  Pt will report reduced sleep disruption due to pain by at least 70%    Status  Achieved      PT LONG TERM GOAL #4   Title  Pt will reduce FOTO to 49% or less to demo less limitations in daily activities.    Status  Achieved      PT LONG TERM GOAL #5    Title  Pt will be ind in HEP including return to walking for exercise at least 3x/week to improve overall mobility, strength and endurance.    Status  On-going            Plan - 05/24/19 0919    Clinical Impression Statement  Pt recovering from a lumbar flare up last week.  Pain has reduced from 9/10 early in the week to 5/10.  Pt was able to tolerate a partial return to previous ther ex today with ongoing emphasis by PT on core stabilization during posture and ther ex.  Pt requested another trial of IFC/heat so PT set this up end of session.  PT counseled Pt on return to HEP with pain as guide and attempts to walk for exercise starting with 10-15 min at slower pace with gradual build.  Pt is tapering to 1x/week as she is working on [employer] and pain management with more independence.  PT to provide ongoing assessment to determine if there is a need for more therapy after two more visits given recent flare up.    PT Frequency  2x / week    PT Duration  8 weeks    PT Treatment/Interventions  ADLs/Self Care Home Management;Aquatic Therapy;Cryotherapy;Electrical Stimulation;Moist Heat;Iontophoresis 4mg /ml Dexamethasone;Traction;Stair training;Gait training;Functional mobility training;Therapeutic activities;Therapeutic exercise;Neuromuscular re-education;Manual techniques;Patient/family education;Passive range of motion;Dry needling;Taping;Spinal Manipulations;Joint Manipulations    PT Next Visit Plan  f/u on flare up, return to walking and tol of HEP, is Pt on track for d/c in mid-March?    PT Home Exercise Plan  Access Code: [email or web address]    Consulted and Agree with Plan of Care  Patient       Patient will benefit from skilled therapeutic intervention in order to improve the following deficits and impairments:     Visit Diagnosis: Acute midline low back pain with right-sided sciatica  Muscle weakness (generalized)  Abnormal posture     Problem List Patient Active Problem List   Diagnosis  Date Noted  . Bradycardia 09/26/2014  . Hypertension   . Obstructive sleep apnea 05/03/2013  . Obesity 05/27/2006  . ASTHMA, UNSPECIFIED 05/27/2006    05/29/2006, PT 05/24/19 9:23 AM   [facility] [address], [address] [place], [place], [place] Phone: [phone]   Fax:  [phone]  Name: [name] [name] MRN: Nicholes Calamity Date of Birth: 1966-02-24

## 2019-05-31 ENCOUNTER — Other Ambulatory Visit: Payer: Self-pay

## 2019-05-31 ENCOUNTER — Encounter: Payer: Self-pay | Admitting: Physical Therapy

## 2019-05-31 ENCOUNTER — Ambulatory Visit: Payer: BC Managed Care – PPO | Attending: Family Medicine | Admitting: Physical Therapy

## 2019-05-31 DIAGNOSIS — R293 Abnormal posture: Secondary | ICD-10-CM | POA: Diagnosis present

## 2019-05-31 DIAGNOSIS — M5441 Lumbago with sciatica, right side: Secondary | ICD-10-CM | POA: Diagnosis present

## 2019-05-31 DIAGNOSIS — M6281 Muscle weakness (generalized): Secondary | ICD-10-CM

## 2019-05-31 NOTE — Therapy (Signed)
[facility] [address] [place], [place], [address] Phone: [phone]   Fax:  [phone]  Physical Therapy Treatment  Patient Details  Name: [name] [name] MRN: [id number] Date of Birth: 1965-05-11 Referring Provider (PT): Darrow Bussing, MD   Encounter Date: 05/31/2019  PT End of Session - 05/31/19 0900    Visit Number  11    Date for PT Re-Evaluation  06/14/19    Authorization Type  BCBS 30 visit limit    Authorization - Visit Number  11    Authorization - Number of Visits  30    PT Start Time  0850    PT Stop Time  0935    PT Time Calculation (min)  45 min    Activity Tolerance  Patient tolerated treatment well    Behavior During Therapy  [facility] for tasks assessed/performed       Past Medical History:  Diagnosis Date  . Anemia    history  . Anxiety   . Arthritis    lower back, knee  . Asthma   . Bradycardia 09/26/2014  . Depression   . Diabetes mellitus without complication ([facility])   . Esophageal reflux   . GERD (gastroesophageal reflux disease)   . H/O gestational diabetes mellitus, not currently pregnant   . Headache(784.0)    on rx med  . Hyperlipidemia    diet controlled  . Hypertension   . Mental disorder   . Migraines   . Obesity   . OSA (obstructive sleep apnea)    Mild w AHI 14/hr now on CPAP at 9cm H2O  . PONV (postoperative nausea and vomiting)   . Seasonal allergies   . Sleep apnea    cpap setting 70  . SVD (spontaneous vaginal delivery)    x 2    Past Surgical History:  Procedure Laterality Date  . ABDOMINAL HYSTERECTOMY    . carpel tunnel surgery     x 2 - left and right  . CESAREAN SECTION  1987   x 1  . CYSTOSCOPY N/A 08/08/2012   Procedure: CYSTOSCOPY;  Surgeon: Serita Kyle, MD;  Location: [facility];  Service: Gynecology;  Laterality: N/A;  . left foot surgery     remove cyst  . ROBOTIC ASSISTED LAPAROSCOPIC LYSIS OF ADHESION N/A 08/08/2012    Procedure: ROBOTIC ASSISTED LAPAROSCOPIC exctensive LYSIS OF ADHESION;  Surgeon: Serita Kyle, MD;  Location: [facility];  Service: Gynecology;  Laterality: N/A;  . ROBOTIC ASSISTED SALPINGO OOPHERECTOMY Left 08/08/2012   Procedure: ROBOTIC ASSISTED SALPINGO OOPHORECTOMY;  Surgeon: Serita Kyle, MD;  Location: [facility];  Service: Gynecology;  Laterality: Left;  . trigger thumb release     x 2 for right and left thumbs  . WISDOM TOOTH EXTRACTION      There were no vitals filed for this visit.  Subjective Assessment - 05/31/19 0856    Subjective  My Rt buttock hurt so much last night it kept me awake.    Limitations  Sitting;Standing;Walking;House hold activities;Lifting    How long can you sit comfortably?  30 min    How long can you stand comfortably?  5-10 min    How long can you walk comfortably?  5-10 min    Diagnostic tests  MRI 2019 L3/4 mild disc space narrowing    Patient Stated Goals  sleep through night, get back to walking up to 5 miles/day, get rid of pain    Currently in Pain?  Yes    Pain Score  5    was 10/10   Pain Location  Back    Pain Orientation  Right    Pain Descriptors / Indicators  Aching    Pain Type  Chronic pain;Acute pain    Pain Onset  More than a month ago    Pain Frequency  Intermittent    Aggravating Factors   standing or walking too long, sitting too long    Effect of Pain on Daily Activities  sleep                       [facility] Adult PT Treatment/Exercise - 05/31/19 0001      Lumbar Exercises: Stretches   Pelvic Tilt  20 reps;5 seconds    Pelvic Tilt Limitations  added to HEP      Lumbar Exercises: Aerobic   Nustep  L2 seat 8 x 10', PT present to discuss symptoms and symptom management      Lumbar Exercises: Supine   Other Supine Lumbar Exercises  sciatic flossing at knee in 90/90 on Rt x 20 reps      Shoulder Exercises: Standing   Extension  Strengthening;Both;15 reps;Theraband    Theraband Level (Shoulder  Extension)  Level 2 (Red)    Row  Strengthening;Both;15 reps;Theraband    Theraband Level (Shoulder Row)  Level 2 (Red)      Moist Heat Therapy   Number Minutes Moist Heat  10 Minutes    Moist Heat Location  Lumbar Spine      Electrical Stimulation   Electrical Stimulation Location  Rt lumbar    Electrical Stimulation Action  IFC in prone    Electrical Stimulation Parameters  10 min    Electrical Stimulation Goals  Pain               PT Short Term Goals - 05/17/19 7169      PT SHORT TERM GOAL #1   Title  Pt will be ind in intial HEP and demo proper standing, sitting posture and bed mobility.    Status  Achieved      PT SHORT TERM GOAL #2   Title  pt will report less sleep disruption due to pain by at least 25%    Status  Achieved        PT Long Term Goals - 05/24/19 0848      PT LONG TERM GOAL #1   Title  Pt will achieve at least 4/5 strength throughout Rt LE to improve functional tasks such as transfers, stairs and walking for exercise.    Status  On-going      PT LONG TERM GOAL #2   Title  Pt will improve lumbar flexion to enable her to don shoes/socks ind    Status  Achieved      PT LONG TERM GOAL #3   Title  Pt will report reduced sleep disruption due to pain by at least 70%    Status  Achieved      PT LONG TERM GOAL #4   Title  Pt will reduce FOTO to 49% or less to demo less limitations in daily activities.    Status  Achieved      PT LONG TERM GOAL #5   Title  Pt will be ind in HEP including return to walking for exercise at least 3x/week to improve overall mobility, strength and endurance.    Status  On-going  Plan - 05/31/19 0929    Clinical Impression Statement  Pt continues to have intermittent high pain levels noted with prolonged standing/sitting and walking.  Pain on Rt lumbar and buttock region prevented sleep last night and was worse with attempts to perform flexion based stretching.  Pt encouraged Pt to trial extension and  prone lying when this happens.  Pt pattern of pain in sessions tends to be to improve with targeted core recruitment and functional strength with cueing of proper body mechanics.  Pt with relief with addition of IFC and heat which Pt positioned in prone for Pt today.  Pt to trial activity modification and HEP for next two weeks with return to clinic at that time for re-evaluation.  Depending on pain levels and symptom management Pt may need further PT.    Comorbidities  obesity, diabetes, high blood pressure, chronic LBP with acute flare up    PT Frequency  2x / week    PT Duration  8 weeks    PT Treatment/Interventions  ADLs/Self Care Home Management;Aquatic Therapy;Cryotherapy;Electrical Stimulation;Moist Heat;Iontophoresis 4mg /ml Dexamethasone;Traction;Stair training;Gait training;Functional mobility training;Therapeutic activities;Therapeutic exercise;Neuromuscular re-education;Manual techniques;Patient/family education;Passive range of motion;Dry needling;Taping;Spinal Manipulations;Joint Manipulations    PT Next Visit Plan  re-eval,  was Pt able to manage symptoms with HEP and activity mod?  f/u on pelvic tilt and sciatic flossing, revisit prone press ups and extension based ther ex if pain is up    PT Home Exercise Plan  Access Code: [email or web address]    Consulted and Agree with Plan of Care  Patient       Patient will benefit from skilled therapeutic intervention in order to improve the following deficits and impairments:     Visit Diagnosis: Acute midline low back pain with right-sided sciatica  Muscle weakness (generalized)  Abnormal posture     Problem List Patient Active Problem List   Diagnosis Date Noted  . Bradycardia 09/26/2014  . Hypertension   . Obstructive sleep apnea 05/03/2013  . Obesity 05/27/2006  . ASTHMA, UNSPECIFIED 05/27/2006    Baruch Merl, PT 05/31/19 9:33 AM   [facility] [address] [place], [place], [address] Phone: [phone]   Fax:  [phone]  Name: [name] [name] MRN: [id number] Date of Birth: 03-04-1966

## 2019-06-14 ENCOUNTER — Encounter: Payer: Self-pay | Admitting: Physical Therapy

## 2019-06-14 ENCOUNTER — Ambulatory Visit: Payer: BC Managed Care – PPO | Admitting: Physical Therapy

## 2019-06-14 ENCOUNTER — Other Ambulatory Visit: Payer: Self-pay

## 2019-06-14 DIAGNOSIS — R293 Abnormal posture: Secondary | ICD-10-CM

## 2019-06-14 DIAGNOSIS — M5441 Lumbago with sciatica, right side: Secondary | ICD-10-CM | POA: Diagnosis not present

## 2019-06-14 DIAGNOSIS — M6281 Muscle weakness (generalized): Secondary | ICD-10-CM

## 2019-06-14 NOTE — Patient Instructions (Signed)
Access Code: [email or web address]: [email or web address]: 03/17/2021Prepared by: Loistine Simas BeuhringExercises  Lying Prone - 1 x daily - 7 x weekly  Standing Back Extension - 1 x daily - 7 x weekly - 10 reps - 3 sets  Sidelying Transversus Abdominis Bracing - 1 x daily - 7 x weekly - 10 reps - 1 sets - 10 hold  Prone Press Up - 4 x daily - 7 x weekly - 10 reps - 1 sets - 2 hold  Sit to Stand with Hands on Knees - 1 x daily - 7 x weekly - 5 reps - 3 sets  Shoulder Extension with Resistance - 1 x daily - 7 x weekly - 15 reps - 2 sets  Standing Bilateral Low Shoulder Row with Anchored Resistance - 1 x daily - 7 x weekly - 15 reps - 2 sets  Seated Hip Abduction with Resistance - 1 x daily - 7 x weekly - 20 reps - 2 sets  Seated March with Resistance - 1 x daily - 7 x weekly - 10 reps - 3 sets  Sitting Knee Extension with Resistance - 1 x daily - 7 x weekly - 10 reps - 2 sets  Seated Table Piriformis Stretch - 1 x daily - 7 x weekly - 3 reps - 3 sets - 30 hold  Supine Posterior Pelvic Tilt - 1 x daily - 7 x weekly - 10 reps - 2 sets - 5 hold  Supine Sciatic Nerve Glide - 1 x daily - 7 x weekly - 20 reps - 2 sets  Plank with Elbows on Table - 1 x daily - 7 x weekly - 10 reps - 3 sets  Plank with Hip Extension on Counter - 1 x daily - 7 x weekly - 3 sets - 10 reps

## 2019-06-14 NOTE — Therapy (Signed)
[facility] [address], [place], [place], [address] Phone: [phone]   Fax:  [phone]  Physical Therapy Treatment  Patient Details  Name: [name] [name] MRN: [id number] Date of Birth: 01/24/1966 Referring Provider (PT): Lujean Amel, MD   Encounter Date: 06/14/2019  PT End of Session - 06/14/19 0945    Visit Number  12    Date for PT Re-Evaluation  06/14/19    Authorization Type  [employer] 30 visit limit    Authorization - Visit Number  12    Authorization - Number of Visits  30    PT Start Time  0940    PT Stop Time  1010   short session   PT Time Calculation (min)  30 min    Activity Tolerance  Patient tolerated treatment well    Behavior During Therapy  [facility] for tasks assessed/performed       Past Medical History:  Diagnosis Date  . Anemia    history  . Anxiety   . Arthritis    lower back, knee  . Asthma   . Bradycardia 09/26/2014  . Depression   . Diabetes mellitus without complication ([place])   . Esophageal reflux   . GERD (gastroesophageal reflux disease)   . H/O gestational diabetes mellitus, not currently pregnant   . Headache(784.0)    on rx med  . Hyperlipidemia    diet controlled  . Hypertension   . Mental disorder   . Migraines   . Obesity   . OSA (obstructive sleep apnea)    Mild w AHI 14/hr now on CPAP at 9cm H2O  . PONV (postoperative nausea and vomiting)   . Seasonal allergies   . Sleep apnea    cpap setting 70  . SVD (spontaneous vaginal delivery)    x 2    Past Surgical History:  Procedure Laterality Date  . ABDOMINAL HYSTERECTOMY    . carpel tunnel surgery     x 2 - left and right  . [place]   x 1  . CYSTOSCOPY N/A 08/08/2012   Procedure: CYSTOSCOPY;  Surgeon: Marvene Staff, MD;  Location: [facility];  Service: Gynecology;  Laterality: N/A;  . left foot surgery     remove cyst  . ROBOTIC ASSISTED LAPAROSCOPIC LYSIS OF ADHESION N/A  08/08/2012   Procedure: ROBOTIC ASSISTED LAPAROSCOPIC exctensive LYSIS OF ADHESION;  Surgeon: Marvene Staff, MD;  Location: [facility];  Service: Gynecology;  Laterality: N/A;  . ROBOTIC ASSISTED SALPINGO OOPHERECTOMY Left 08/08/2012   Procedure: ROBOTIC ASSISTED SALPINGO OOPHORECTOMY;  Surgeon: Marvene Staff, MD;  Location: [facility];  Service: Gynecology;  Laterality: Left;  . trigger thumb release     x 2 for right and left thumbs  . WISDOM TOOTH EXTRACTION      There were no vitals filed for this visit.  Subjective Assessment - 06/14/19 0941    Subjective  Pt continues to have intermittent high levels of pain.  Some days are good.  Hard to determine what triggers bad day.  Sunday could hardly walk due to Rt low back and buttock pain.  Some anterior thigh pain that comes and goes.  The HEP helps sometimes.  Sleep is disturbed on/off.    Limitations  Sitting;Standing;Walking;House hold activities;Lifting    How long can you sit comfortably?  30 min    How long can you stand comfortably?  5-10 min    How long can you walk comfortably?  5-10 min    Diagnostic tests  MRI 2019 L3/4 mild disc space narrowing    Patient Stated Goals  sleep through night, get back to walking up to 5 miles/day, get rid of pain    Currently in Pain?  Yes    Pain Score  4     Pain Location  Back    Pain Orientation  Right    Pain Descriptors / Indicators  Aching    Pain Type  Chronic pain    Pain Onset  More than a month ago    Pain Frequency  Intermittent                               PT Education - 06/14/19 1007    Education Details  Access Code: [email or web address]    Person(s) Educated  Patient    Methods  Explanation;Demonstration    Comprehension  Verbalized understanding;Returned demonstration       PT Short Term Goals - 05/17/19 0852      PT SHORT TERM GOAL #1   Title  Pt will be ind in intial HEP and demo proper standing, sitting posture and bed mobility.    Status   Achieved      PT SHORT TERM GOAL #2   Title  pt will report less sleep disruption due to pain by at least 25%    Status  Achieved        PT Long Term Goals - 06/14/19 0950      PT LONG TERM GOAL #1   Title  Pt will achieve at least 4/5 strength throughout Rt LE to improve functional tasks such as transfers, stairs and walking for exercise.      PT LONG TERM GOAL #2   Title  Pt will improve lumbar flexion to enable her to don shoes/socks ind    Baseline  some days limited by pain, other days independent with task    Status  Partially Met      PT LONG TERM GOAL #3   Title  Pt will report reduced sleep disruption due to pain by at least 70%    Baseline  50%    Status  Partially Met      PT LONG TERM GOAL #4   Title  Pt will reduce FOTO to 49% or less to demo less limitations in daily activities.    Baseline  28%    Status  Achieved      PT LONG TERM GOAL #5   Title  Pt will be ind in HEP including return to walking for exercise at least 3x/week to improve overall mobility, strength and endurance.    Baseline  not consistent, 1x/week, has intentions to increase compliance with this    Status  Partially Met            Plan - 06/14/19 1011    Clinical Impression Statement  Pt wishes to be d/c'd at this time.  Strength in LEs and core awareness are improved.  Trunk ROM is limited and painful in flexion and improved with extension.  Standing/walking tolerance is 5-10 min.  Sitting is limited to 30 min.  Pt had initial very postive response to PT with improved pain levels and activity tolerance but then several weeks ago had a flare up.  Pain has been intermittent since then with some days good, some days 9/10 pain and sleep disruption.  PT advised Pt create  and track an activity and pain diary to track and look for patterns of aggravating and helpful factors.  Pt is using HEP as a tool to reduce pain, impove posture and strength.  She hopes to be more consistent with walking program  and may look for a back brace to support this since standing/walking tolerance is 5-10 min.  PT reviewed HEP today and updated with a few new ideas for variety for trunk and hip stability/strength.  Pt understands that she may benefit from return to MD and more PT at a later time if she isn't able to manage symptoms independently.    Comorbidities  obesity, diabetes, high blood pressure, chronic LBP with acute flare up    Rehab Potential  Good    PT Frequency  2x / week    PT Duration  8 weeks    PT Treatment/Interventions  ADLs/Self Care Home Management;Aquatic Therapy;Cryotherapy;Electrical Stimulation;Moist Heat;Iontophoresis 32m/ml Dexamethasone;Traction;Stair training;Gait training;Functional mobility training;Therapeutic activities;Therapeutic exercise;Neuromuscular re-education;Manual techniques;Patient/family education;Passive range of motion;Dry needling;Taping;Spinal Manipulations;Joint Manipulations    PT Next Visit Plan  d/c to HEP, PT recommends f/u with MD and do activity/pain diary to look for aggravating and helpful patterns with ongoing intermittent pain    PT Home Exercise Plan  Access Code: [email or web address]    Consulted and Agree with Plan of Care  Patient       Patient will benefit from skilled therapeutic intervention in order to improve the following deficits and impairments:     Visit Diagnosis: Acute midline low back pain with right-sided sciatica  Muscle weakness (generalized)  Abnormal posture     Problem List Patient Active Problem List   Diagnosis Date Noted  . Bradycardia 09/26/2014  . Hypertension   . Obstructive sleep apnea 05/03/2013  . Obesity 05/27/2006  . ASTHMA, UNSPECIFIED 05/27/2006    PHYSICAL THERAPY DISCHARGE SUMMARY  Visits from Start of Care: 12 Current functional level related to goals / functional outcomes: See above   Remaining deficits: See above   Education / Equipment: HEP Plan: Patient agrees to discharge.  Patient goals were  partially met. Patient is being discharged due to lack of progress.  ?????         JBaruch Merl PT 06/14/19 10:18 AM   [facility] [address] [place] [address]Phone: [phone]  Fax:  [phone] Name: [name] DELPRIOREMRN: [id number] of Birth: 107-Dec-1967

## 2019-07-11 ENCOUNTER — Other Ambulatory Visit: Payer: Self-pay | Admitting: Family Medicine

## 2019-07-11 DIAGNOSIS — R7401 Elevation of levels of liver transaminase levels: Secondary | ICD-10-CM

## 2019-07-12 ENCOUNTER — Ambulatory Visit
Admission: RE | Admit: 2019-07-12 | Discharge: 2019-07-12 | Disposition: A | Discharge status: Home / Self Care | Payer: BC Managed Care – PPO | Source: Ambulatory Visit | Attending: Family Medicine | Admitting: Family Medicine

## 2019-07-12 DIAGNOSIS — R7401 Elevation of levels of liver transaminase levels: Secondary | ICD-10-CM

## 2019-10-26 ENCOUNTER — Encounter (HOSPITAL_BASED_OUTPATIENT_CLINIC_OR_DEPARTMENT_OTHER): Payer: Self-pay | Admitting: Emergency Medicine

## 2019-10-26 ENCOUNTER — Emergency Department (HOSPITAL_BASED_OUTPATIENT_CLINIC_OR_DEPARTMENT_OTHER): Payer: BC Managed Care – PPO

## 2019-10-26 ENCOUNTER — Emergency Department (HOSPITAL_BASED_OUTPATIENT_CLINIC_OR_DEPARTMENT_OTHER)
Admission: EM | Admit: 2019-10-26 | Discharge: 2019-10-26 | Disposition: A | Discharge status: Home / Self Care | Payer: BC Managed Care – PPO | Attending: Emergency Medicine | Admitting: Emergency Medicine

## 2019-10-26 ENCOUNTER — Other Ambulatory Visit: Payer: Self-pay

## 2019-10-26 DIAGNOSIS — J45909 Unspecified asthma, uncomplicated: Secondary | ICD-10-CM

## 2019-10-26 DIAGNOSIS — Z79899 Other long term (current) drug therapy: Secondary | ICD-10-CM | POA: Diagnosis not present

## 2019-10-26 DIAGNOSIS — J4520 Mild intermittent asthma, uncomplicated: Secondary | ICD-10-CM | POA: Insufficient documentation

## 2019-10-26 DIAGNOSIS — E119 Type 2 diabetes mellitus without complications: Secondary | ICD-10-CM | POA: Diagnosis not present

## 2019-10-26 DIAGNOSIS — J069 Acute upper respiratory infection, unspecified: Secondary | ICD-10-CM | POA: Diagnosis not present

## 2019-10-26 DIAGNOSIS — Z7951 Long term (current) use of inhaled steroids: Secondary | ICD-10-CM | POA: Diagnosis not present

## 2019-10-26 DIAGNOSIS — I10 Essential (primary) hypertension: Secondary | ICD-10-CM | POA: Diagnosis not present

## 2019-10-26 DIAGNOSIS — R072 Precordial pain: Secondary | ICD-10-CM | POA: Diagnosis present

## 2019-10-26 LAB — TROPONIN I (HIGH SENSITIVITY): Troponin I (High Sensitivity): 5 ng/L (ref <18)

## 2019-10-26 LAB — CBC
HCT: 39.3 % (ref 36.0–46.0)
Hemoglobin: 13 g/dL (ref 12.0–15.0)
MCH: 31.5 pg (ref 26.0–34.0)
MCHC: 33.1 g/dL (ref 30.0–36.0)
MCV: 95.2 fL (ref 80.0–100.0)
Platelets: 171 10*3/uL (ref 150–400)
RBC: 4.13 MIL/uL (ref 3.87–5.11)
RDW: 12.7 % (ref 11.5–15.5)
WBC: 4.8 10*3/uL (ref 4.0–10.5)
nRBC: 0 % (ref 0.0–0.2)

## 2019-10-26 LAB — BASIC METABOLIC PANEL
Anion gap: 10 (ref 5–15)
BUN: 6 mg/dL (ref 6–20)
CO2: 26 mmol/L (ref 22–32)
Calcium: 9.1 mg/dL (ref 8.9–10.3)
Chloride: 103 mmol/L (ref 98–111)
Creatinine, Ser: 0.74 mg/dL (ref 0.44–1.00)
GFR calc Af Amer: >60 mL/min (ref >60)
GFR calc non Af Amer: >60 mL/min (ref >60)
Glucose, Bld: 114 mg/dL — ABNORMAL HIGH (ref 70–99)
Potassium: 3.8 mmol/L (ref 3.5–5.1)
Sodium: 139 mmol/L (ref 135–145)

## 2019-10-26 MED ORDER — PREDNISONE 20 MG PO TABS: 40.0000 mg | ORAL_TABLET | Freq: Every day | ORAL | 0 refills | Status: AC

## 2019-10-26 NOTE — ED Provider Notes (Signed)
[facility] EMERGENCY DEPARTMENT Provider Note   CSN: [id number] Arrival date & time: 10/26/19  1708     History Chief Complaint  Patient presents with  . Chest Pain    [name] [name] is a 54 y.o. female.  The history is provided by the patient.  Chest Pain Pain location:  Substernal area Pain quality: aching   Pain radiates to:  Does not radiate Pain severity:  Mild Onset quality:  Gradual Timing:  Intermittent Progression:  Waxing and waning Chronicity:  New Context comment:  Coughing, URI symptoms, awaiting covid test, using inhaler with relief Relieved by: inhaler. Associated symptoms: shortness of breath   Associated symptoms: no abdominal pain, no back pain, no cough, no fever, no palpitations and no vomiting   Risk factors: diabetes mellitus and high cholesterol   Risk factors comment:  Asthma      Past Medical History:  Diagnosis Date  . Anemia    history  . Anxiety   . Arthritis    lower back, knee  . Asthma   . Bradycardia 09/26/2014  . Depression   . Diabetes mellitus without complication ([facility])   . Esophageal reflux   . GERD (gastroesophageal reflux disease)   . H/O gestational diabetes mellitus, not currently pregnant   . Headache(784.0)    on rx med  . Hyperlipidemia    diet controlled  . Hypertension   . Mental disorder   . Migraines   . Obesity   . OSA (obstructive sleep apnea)    Mild w AHI 14/hr now on CPAP at 9cm H2O  . PONV (postoperative nausea and vomiting)   . Seasonal allergies   . Sleep apnea    cpap setting 70  . SVD (spontaneous vaginal delivery)    x 2    Patient Active Problem List   Diagnosis Date Noted  . Bradycardia 09/26/2014  . Hypertension   . Obstructive sleep apnea 05/03/2013  . Obesity 05/27/2006  . ASTHMA, UNSPECIFIED 05/27/2006    Past Surgical History:  Procedure Laterality Date  . ABDOMINAL HYSTERECTOMY    . carpel tunnel surgery     x 2 - left and right  . CESAREAN  SECTION  1987   x 1  . CYSTOSCOPY N/A 08/08/2012   Procedure: CYSTOSCOPY;  Surgeon: Serita Kyle, MD;  Location: [facility];  Service: Gynecology;  Laterality: N/A;  . left foot surgery     remove cyst  . ROBOTIC ASSISTED LAPAROSCOPIC LYSIS OF ADHESION N/A 08/08/2012   Procedure: ROBOTIC ASSISTED LAPAROSCOPIC exctensive LYSIS OF ADHESION;  Surgeon: Serita Kyle, MD;  Location: [facility];  Service: Gynecology;  Laterality: N/A;  . ROBOTIC ASSISTED SALPINGO OOPHERECTOMY Left 08/08/2012   Procedure: ROBOTIC ASSISTED SALPINGO OOPHORECTOMY;  Surgeon: Serita Kyle, MD;  Location: [facility];  Service: Gynecology;  Laterality: Left;  . trigger thumb release     x 2 for right and left thumbs  . WISDOM TOOTH EXTRACTION       OB History   No obstetric history on file.     Family History  Problem Relation Age of Onset  . Diabetes Mellitus II Mother   . Diabetes Maternal Grandmother   . CAD Maternal Grandmother   . CAD Maternal Grandfather   . Colon cancer Maternal Uncle   . Colon cancer Paternal Uncle   . Breast cancer Neg Hx     Social History   Tobacco Use  . Smoking status: Never Smoker  . Smokeless tobacco:  Never Used  Vaping Use  . Vaping Use: Never used  Substance Use Topics  . Alcohol use: No  . Drug use: No    Home Medications Prior to Admission medications   Medication Sig Start Date End Date Taking? Authorizing Provider  albuterol (PROVENTIL HFA;VENTOLIN HFA) 108 (90 BASE) MCG/ACT inhaler Inhale 2 puffs into the lungs every 6 (six) hours as needed for wheezing or shortness of breath.    [provider]  albuterol (PROVENTIL) (2.5 MG/3ML) 0.083% nebulizer solution Take 2.5 mg by nebulization every 6 (six) hours as needed for wheezing or shortness of breath.    [provider]  ALPRAZolam Prudy Feeler) 0.5 MG tablet Take 0.5 mg by mouth at bedtime as needed for sleep or anxiety.    [provider]  aspirin 81 MG tablet Take 81 mg by mouth  daily.    [provider]  B-D ULTRAFINE III SHORT PEN 31G X 8 MM MISC See admin instructions. 09/19/14   [provider]  budesonide-formoterol (SYMBICORT) 160-4.5 MCG/ACT inhaler Inhale 2 puffs into the lungs 2 (two) times daily.    [provider]  cyclobenzaprine (FLEXERIL) 10 MG tablet Take 10 mg by mouth 3 (three) times daily as needed for muscle spasms.    [provider]  diphenhydrAMINE (BENADRYL) 25 MG tablet Take 2 tablets (50 mg total) by mouth every 6 (six) hours as needed for allergies. 03/19/17   Arby Barrette, MD  DULoxetine (CYMBALTA) 60 MG capsule Take 60 mg by mouth daily.    [provider]  EPINEPHrine (EPIPEN 2-PAK) 0.3 mg/0.3 mL IJ SOAJ injection as needed. 10/25/12   [provider]  esomeprazole (NEXIUM) 40 MG capsule Take 1 capsule (40 mg total) by mouth 2 (two) times daily before a meal. 02/11/15   Turner, Cornelious Bryant, MD  estradiol (CLIMARA - DOSED IN MG/24 HR) 0.1 mg/24hr patch Place 0.1 mg onto the skin once a week.    [provider]  folic acid (FOLVITE) 1 MG tablet Take 1 mg by mouth daily.  07/24/14   [provider]  gabapentin (NEURONTIN) 300 MG capsule Take 300 mg by mouth 3 (three) times daily.    [provider]  HYDROcodone-acetaminophen (NORCO/VICODIN) 5-325 MG tablet Take 1-2 tablets by mouth every 6 (six) hours as needed for moderate pain. Patient not taking: Reported on 03/19/2017 06/10/15   Cathren Laine, MD  insulin degludec (TRESIBA FLEXTOUCH) 100 UNIT/ML SOPN FlexTouch Pen Inject 29-30 Units into the skin daily at 10 pm.    [provider]  insulin glargine (LANTUS) 100 UNIT/ML injection Inject 30 Units into the skin at bedtime.    [provider]  ipratropium (ATROVENT) 0.02 % nebulizer solution Take 2.5 mLs (0.5 mg total) by nebulization 4 (four) times daily. Patient taking differently: Take 0.5 mg by nebulization every 6 (six) hours as needed for wheezing or  shortness of breath.  04/12/14   Arby Barrette, MD  levocetirizine (XYZAL) 5 MG tablet Take 5 mg by mouth every evening. 09/08/14   [provider]  losartan (COZAAR) 50 MG tablet Take 50 mg by mouth daily.  07/24/14   [provider]  meclizine (ANTIVERT) 25 MG tablet Take 25 mg by mouth 2 (two) times daily as needed for dizziness.    [provider]  meloxicam (MOBIC) 15 MG tablet Take 15 mg by mouth daily as needed for pain.    [provider]  metFORMIN (GLUCOPHAGE-XR) 500 MG 24 hr tablet Take  1,000 mg by mouth every evening.  03/11/15   [provider]  methotrexate (RHEUMATREX) 2.5 MG tablet Take 10 mg by mouth once a week. On Wednesday. Caution:Chemotherapy. Protect from light.    [provider]  montelukast (SINGULAIR) 10 MG tablet Take 10 mg by mouth at bedtime.    [provider]  naproxen (NAPROSYN) 500 MG tablet Take 500 mg by mouth 2 (two) times daily as needed for moderate pain.  09/06/14   [provider]  predniSONE (DELTASONE) 20 MG tablet Take 2 tablets (40 mg total) by mouth daily for 5 days. 10/26/19 10/31/19  Donnivan Villena, DO  SUMAtriptan (IMITREX) 100 MG tablet Take 100 mg by mouth every 2 (two) hours as needed for migraine.    [provider]  temazepam (RESTORIL) 15 MG capsule Take 15 mg by mouth at bedtime.    [provider]  topiramate (TOPAMAX) 25 MG tablet Take 75 mg by mouth at bedtime.    [provider]  Vitamin D, Ergocalciferol, (DRISDOL) 50000 UNITS CAPS Take 50,000 Units by mouth 3 (three) times a week. Mondays, wednesdays, fridays    [provider]    Allergies    Iodine  Review of Systems   Review of Systems  Constitutional: Negative for chills and fever.  HENT: Negative for ear pain and sore throat.   Eyes: Negative for pain and visual disturbance.  Respiratory: Positive for shortness of breath and wheezing. Negative for cough.   Cardiovascular:  Positive for chest pain. Negative for palpitations.  Gastrointestinal: Negative for abdominal pain and vomiting.  Genitourinary: Negative for dysuria and hematuria.  Musculoskeletal: Negative for arthralgias and back pain.  Skin: Negative for color change and rash.  Neurological: Negative for seizures and syncope.  All other systems reviewed and are negative.   Physical Exam Updated Vital Signs BP (!) 169/95 (BP Location: Right Arm)   Pulse 66   Temp 98.4 F (36.9 C) (Oral)   Resp 18   Ht 5\' 4"  (1.626 m)   Wt (!) 133.8 kg   SpO2 99%   BMI 50.64 kg/m   Physical Exam Vitals and nursing note reviewed.  Constitutional:      General: She is not in acute distress.    Appearance: She is well-developed.  HENT:     Head: Normocephalic and atraumatic.  Eyes:     Extraocular Movements: Extraocular movements intact.     Conjunctiva/sclera: Conjunctivae normal.     Pupils: Pupils are equal, round, and reactive to light.  Cardiovascular:     Rate and Rhythm: Normal rate and regular rhythm.     Heart sounds: Normal heart sounds. No murmur heard.   Pulmonary:     Effort: Pulmonary effort is normal. No respiratory distress.     Breath sounds: Decreased breath sounds and wheezing present.  Abdominal:     Palpations: Abdomen is soft.     Tenderness: There is no abdominal tenderness.  Musculoskeletal:        General: Normal range of motion.     Cervical back: Normal range of motion and neck supple.     Right lower leg: No edema.     Left lower leg: No edema.  Skin:    General: Skin is warm and dry.     Capillary Refill: Capillary refill takes less than 2 seconds.  Neurological:     General: No focal deficit present.     Mental Status: She is alert.     ED  Results / Procedures / Treatments   Labs (all labs ordered are listed, but only abnormal results are displayed) Labs Reviewed  BASIC METABOLIC PANEL - Abnormal; Notable for the following components:      Result Value    Glucose, Bld 114 (*)    All other components within normal limits  CBC  TROPONIN I (HIGH SENSITIVITY)    EKG EKG Interpretation  Date/Time:  Thursday October 26 2019 17:15:42 EDT Ventricular Rate:  70 PR Interval:  150 QRS Duration: 74 QT Interval:  386 QTC Calculation: 416 R Axis:   7 Text Interpretation: Normal sinus rhythm No significant change since last tracing Reconfirmed by Virgina Norfolk [phone]) on 10/26/2019 6:46:06 PM   Radiology DG Chest Portable 1 View  Result Date: 10/26/2019 CLINICAL DATA:  Chest pain. Additional provided: Chest pain worse with inspiration, cough, shortness of breath. EXAM: PORTABLE CHEST 1 VIEW COMPARISON:  Prior chest radiographs 01/05/2019 and earlier FINDINGS: Heart size within normal limits. No appreciable airspace consolidation or pulmonary edema. No evidence of pleural effusion or pneumothorax. No acute bony abnormality identified. Thoracic spondylosis. IMPRESSION: No evidence of active cardiopulmonary disease. Electronically Signed   By: Jackey Loge DO   On: 10/26/2019 18:17    Procedures Procedures (including critical care time)  Medications Ordered in ED Medications - No data to display  ED Course  I have reviewed the triage vital signs and the nursing notes.  Pertinent labs & imaging results that were available during my care of the patient were reviewed by me and considered in my medical decision making (see chart for details).    MDM Rules/Calculators/A&P                          [name] [name] is a 54 year old female with history of diabetes, hypertension, high cholesterol, asthma who presents to the ED with cough, congestion, chest pain, shortness of breath.  Vital signs overall unremarkable.  Symptoms for last several days.  Awaiting Covid test.  Not vaccinated against Covid.  Chest x-ray shows no signs of infection.  No significant anemia, electrolyte abnormality, kidney injury otherwise.  Troponin normal.  EKG shows  sinus rhythm.  No ischemic changes.  She has some diminished breath sounds with wheezing throughout.  Suspect asthma exacerbation.  No PE risk factors and doubt PE.  Doubt ACS.  Has had improved symptoms with albuterol use today.  Recommend continued use of albuterol will prescribe prednisone.  Given return precautions and discharged in ED in good condition.  This chart was dictated using voice recognition software.  Despite best efforts to proofread,  errors can occur which can change the documentation meaning.    Final Clinical Impression(s) / ED Diagnoses Final diagnoses:  Mild asthma without complication, unspecified whether persistent  Upper respiratory tract infection, unspecified type    Rx / DC Orders ED Discharge Orders         Ordered    predniSONE (DELTASONE) 20 MG tablet  Daily     Discontinue  Reprint     10/26/19 1901           Virgina Norfolk, DO 10/26/19 1903

## 2019-10-26 NOTE — ED Triage Notes (Signed)
Chest pain, worse with inspiration, onset yesterday. Recently had URI symptoms and has a pending covid test

## 2019-12-05 ENCOUNTER — Other Ambulatory Visit: Payer: Self-pay | Admitting: Family Medicine

## 2019-12-05 DIAGNOSIS — Z1231 Encounter for screening mammogram for malignant neoplasm of breast: Secondary | ICD-10-CM

## 2020-01-11 ENCOUNTER — Other Ambulatory Visit: Payer: Self-pay

## 2020-01-11 ENCOUNTER — Ambulatory Visit
Admission: RE | Admit: 2020-01-11 | Discharge: 2020-01-11 | Disposition: A | Discharge status: Home / Self Care | Payer: BC Managed Care – PPO | Source: Ambulatory Visit | Attending: Family Medicine | Admitting: Family Medicine

## 2020-01-11 DIAGNOSIS — Z1231 Encounter for screening mammogram for malignant neoplasm of breast: Secondary | ICD-10-CM

## 2020-05-15 ENCOUNTER — Other Ambulatory Visit: Payer: Self-pay | Admitting: Family Medicine

## 2020-05-15 DIAGNOSIS — N644 Mastodynia: Secondary | ICD-10-CM

## 2020-05-20 ENCOUNTER — Other Ambulatory Visit: Payer: Self-pay

## 2020-05-20 ENCOUNTER — Ambulatory Visit
Admission: RE | Admit: 2020-05-20 | Discharge: 2020-05-20 | Disposition: A | Discharge status: Home / Self Care | Payer: BC Managed Care – PPO | Source: Ambulatory Visit | Attending: Family Medicine | Admitting: Family Medicine

## 2020-05-20 ENCOUNTER — Ambulatory Visit: Payer: BC Managed Care – PPO

## 2020-05-20 DIAGNOSIS — N644 Mastodynia: Secondary | ICD-10-CM

## 2020-06-03 ENCOUNTER — Ambulatory Visit: Payer: Self-pay | Admitting: Podiatry

## 2020-06-06 ENCOUNTER — Other Ambulatory Visit: Payer: Self-pay

## 2020-06-06 ENCOUNTER — Ambulatory Visit (INDEPENDENT_AMBULATORY_CARE_PROVIDER_SITE_OTHER): Payer: BC Managed Care – PPO

## 2020-06-06 ENCOUNTER — Ambulatory Visit (INDEPENDENT_AMBULATORY_CARE_PROVIDER_SITE_OTHER): Payer: BC Managed Care – PPO | Admitting: Podiatry

## 2020-06-06 ENCOUNTER — Other Ambulatory Visit: Payer: Self-pay | Admitting: Podiatry

## 2020-06-06 DIAGNOSIS — M21611 Bunion of right foot: Secondary | ICD-10-CM

## 2020-06-06 DIAGNOSIS — M25879 Other specified joint disorders, unspecified ankle and foot: Secondary | ICD-10-CM

## 2020-06-06 DIAGNOSIS — M79672 Pain in left foot: Secondary | ICD-10-CM

## 2020-06-06 DIAGNOSIS — M21612 Bunion of left foot: Secondary | ICD-10-CM

## 2020-06-06 DIAGNOSIS — M79671 Pain in right foot: Secondary | ICD-10-CM

## 2020-06-06 DIAGNOSIS — M722 Plantar fascial fibromatosis: Secondary | ICD-10-CM | POA: Diagnosis not present

## 2020-06-06 DIAGNOSIS — M21619 Bunion of unspecified foot: Secondary | ICD-10-CM

## 2020-06-06 MED ORDER — MELOXICAM 15 MG PO TABS: 15.0000 mg | ORAL_TABLET | Freq: Every day | ORAL | 0 refills | Status: AC

## 2020-06-06 NOTE — Patient Instructions (Signed)
Bunion A bunion (hallux valgus) is a bump that forms slowly on the inner side of the big toe joint. It occurs when the big toe turns toward the second toe. Bunions may be small at first, but they often get larger over time. They can make walking painful. What are the causes? This condition may be caused by:  Wearing narrow or pointed shoes that force the big toe to press against the other toes.  Abnormal foot development that causes the foot to roll inward.  Changes in the foot that are caused by certain diseases, such as rheumatoid arthritis or polio.  A foot injury. What increases the risk? The following factors may make you more likely to develop this condition:  Wearing shoes that squeeze the toes together.  Having certain diseases, such as: ? Rheumatoid arthritis. ? Polio. ? Cerebral palsy.  Having family members who have bunions.  Being born with abnormally shaped feet (a foot deformity), such as flat feet or low arches.  Doing activities that put a lot of pressure on the feet, such as ballet dancing. What are the signs or symptoms? The main symptom of this condition is a bump on your big toe that you can notice. Other symptoms may include:  Pain.  Redness and inflammation around your big toe.  Thick or hardened skin on your big toe or between your toes.  Stiffness or loss of motion in your big toe.  Trouble with walking.   How is this diagnosed? This condition may be diagnosed based on your symptoms, medical history, and activities. You may also have tests and imaging, such as:  X-rays. These allow your health care provider to check the position of the bones in your foot and look for damage to your joint. They also help your health care provider determine the severity of your bunion and the best way to treat it.  Joint aspiration. In this test, a sample of fluid is removed from the toe joint. This test may be done if you are in a lot of pain. It helps rule out  diseases that cause painful swelling of the joints, such as arthritis or gout. How is this treated? Treatment depends on the severity of your symptoms. The goal of treatment is to relieve symptoms and prevent your bunion from getting worse. Your health care provider may recommend:  Wearing shoes that have a wide toe box, or using bunion pads to cushion the affected area.  Taping your toes together to keep them in a normal position.  Placing a device inside your shoe (orthotic device) to help reduce pressure on your toe joint.  Taking medicine to ease pain and inflammation.  Putting ice or heat on the affected area.  Doing stretching exercises.  Surgery, for severe cases. Follow these instructions at home: Managing pain, stiffness, and swelling  If directed, put ice on the painful area. To do this: ? Put ice in a plastic bag. ? Place a towel between your skin and the bag. ? Leave the ice on for 20 minutes, 2-3 times a day. ? Remove the ice if your skin turns bright red. This is very important. If you cannot feel pain, heat, or cold, you have a greater risk of damage to the area.  If directed, apply heat to the affected area before you exercise. Use the heat source that your health care provider recommends, such as a moist heat pack or a heating pad. ? Place a towel between your skin and the   heat source. ? Leave the heat on for 20-30 minutes. ? Remove the heat if your skin turns bright red. This is especially important if you are unable to feel pain, heat, or cold. You have a greater risk of getting burned.      General instructions  Do exercises as told by your health care provider.  Support your toe joint with proper footwear, shoe padding, or taping as told by your health care provider.  Take over-the-counter and prescription medicines only as told by your health care provider.  Do not use any products that contain nicotine or tobacco, such as cigarettes, e-cigarettes, and  chewing tobacco. If you need help quitting, ask your health care provider.  Keep all follow-up visits. This is important. Contact a health care provider if:  Your symptoms get worse.  Your symptoms do not improve in 2 weeks. Get help right away if:  You have severe pain and trouble with walking. Summary  A bunion is a bump on the inner side of the big toe joint that forms when the big toe turns toward the second toe.  Bunions can make walking painful.  Treatment depends on the severity of your symptoms.  Support your toe joint with proper footwear, shoe padding, or taping as told by your health care provider. This information is not intended to replace advice given to you by your health care provider. Make sure you discuss any questions you have with your health care provider. Document Revised: 07/21/2019 Document Reviewed: 07/21/2019 Elsevier Patient Education  2021 [employer]  

## 2020-06-10 NOTE — Progress Notes (Signed)
Subjective:   Patient ID: [name] [name], female   DOB: 55 y.o.   MRN: [id number]   HPI 55 year old female presents the office with concerns of bilateral foot pain.  She states that she has a bunion on the left foot she also gets discomfort on the right foot pointing to submetatarsal 1.  She said the right foot just started around January February.  The left side bunion has been more chronic.  She has a history of plantar fasciitis as well but currently not experiencing discomfort to that area.  She said no recent treatment for these new issues.  No recent injury or falls.   Review of Systems  All other systems reviewed and are negative.  Past Medical History:  Diagnosis Date  . Anemia    history  . Anxiety   . Arthritis    lower back, knee  . Asthma   . Bradycardia 09/26/2014  . Depression   . Diabetes mellitus without complication (HCC)   . Esophageal reflux   . GERD (gastroesophageal reflux disease)   . H/O gestational diabetes mellitus, not currently pregnant   . Headache(784.0)    on rx med  . Hyperlipidemia    diet controlled  . Hypertension   . Mental disorder   . Migraines   . Obesity   . OSA (obstructive sleep apnea)    Mild w AHI 14/hr now on CPAP at 9cm H2O  . PONV (postoperative nausea and vomiting)   . Seasonal allergies   . Sleep apnea    cpap setting 70  . SVD (spontaneous vaginal delivery)    x 2    Past Surgical History:  Procedure Laterality Date  . ABDOMINAL HYSTERECTOMY    . BREAST BIOPSY Right 2015  . carpel tunnel surgery     x 2 - left and right  . CESAREAN SECTION  1987   x 1  . CYSTOSCOPY N/A 08/08/2012   Procedure: CYSTOSCOPY;  Surgeon: Serita Kyle, MD;  Location: [facility];  Service: Gynecology;  Laterality: N/A;  . left foot surgery     remove cyst  . ROBOTIC ASSISTED LAPAROSCOPIC LYSIS OF ADHESION N/A 08/08/2012   Procedure: ROBOTIC ASSISTED LAPAROSCOPIC exctensive LYSIS OF ADHESION;  Surgeon: Serita Kyle,  MD;  Location: [facility];  Service: Gynecology;  Laterality: N/A;  . ROBOTIC ASSISTED SALPINGO OOPHERECTOMY Left 08/08/2012   Procedure: ROBOTIC ASSISTED SALPINGO OOPHORECTOMY;  Surgeon: Serita Kyle, MD;  Location: [facility];  Service: Gynecology;  Laterality: Left;  . trigger thumb release     x 2 for right and left thumbs  . WISDOM TOOTH EXTRACTION       Current Outpatient Medications:  .  meloxicam (MOBIC) 15 MG tablet, Take 1 tablet (15 mg total) by mouth daily., Disp: 14 tablet, Rfl: 0 .  albuterol (PROVENTIL HFA;VENTOLIN HFA) 108 (90 BASE) MCG/ACT inhaler, Inhale 2 puffs into the lungs every 6 (six) hours as needed for wheezing or shortness of breath., Disp: , Rfl:  .  albuterol (PROVENTIL) (2.5 MG/3ML) 0.083% nebulizer solution, Take 2.5 mg by nebulization every 6 (six) hours as needed for wheezing or shortness of breath., Disp: , Rfl:  .  ALPRAZolam (XANAX) 0.5 MG tablet, Take 0.5 mg by mouth at bedtime as needed for sleep or anxiety., Disp: , Rfl:  .  aspirin 81 MG tablet, Take 81 mg by mouth daily., Disp: , Rfl:  .  B-D ULTRAFINE III SHORT PEN 31G X 8 MM MISC, See admin instructions.,  Disp: , Rfl: 0 .  BREO ELLIPTA 200-25 MCG/INH AEPB, Inhale 1 puff into the lungs daily., Disp: , Rfl:  .  budesonide-formoterol (SYMBICORT) 160-4.5 MCG/ACT inhaler, Inhale 2 puffs into the lungs 2 (two) times daily., Disp: , Rfl:  .  cyclobenzaprine (FLEXERIL) 10 MG tablet, Take 10 mg by mouth 3 (three) times daily as needed for muscle spasms., Disp: , Rfl:  .  diphenhydrAMINE (BENADRYL) 25 MG tablet, Take 2 tablets (50 mg total) by mouth every 6 (six) hours as needed for allergies., Disp: 20 tablet, Rfl: 0 .  DULoxetine (CYMBALTA) 60 MG capsule, Take 60 mg by mouth daily., Disp: , Rfl:  .  ENBREL MINI 50 MG/ML SOCT, Inject into the skin., Disp: , Rfl:  .  EPINEPHrine (EPIPEN 2-PAK) 0.3 mg/0.3 mL IJ SOAJ injection, as needed., Disp: , Rfl:  .  esomeprazole (NEXIUM) 40 MG capsule, Take 1 capsule (40 mg  total) by mouth 2 (two) times daily before a meal., Disp: 60 capsule, Rfl: 11 .  estradiol (CLIMARA - DOSED IN MG/24 HR) 0.1 mg/24hr patch, Place 0.1 mg onto the skin once a week., Disp: , Rfl:  .  folic acid (FOLVITE) 1 MG tablet, Take 1 mg by mouth daily. , Disp: , Rfl:  .  gabapentin (NEURONTIN) 300 MG capsule, Take 300 mg by mouth 3 (three) times daily., Disp: , Rfl:  .  HYDROcodone-acetaminophen (NORCO/VICODIN) 5-325 MG tablet, Take 1-2 tablets by mouth every 6 (six) hours as needed for moderate pain. (Patient not taking: Reported on 03/19/2017), Disp: 20 tablet, Rfl: 0 .  insulin degludec (TRESIBA FLEXTOUCH) 100 UNIT/ML SOPN FlexTouch Pen, Inject 29-30 Units into the skin daily at 10 pm., Disp: , Rfl:  .  insulin glargine (LANTUS) 100 UNIT/ML injection, Inject 30 Units into the skin at bedtime., Disp: , Rfl:  .  ipratropium (ATROVENT) 0.02 % nebulizer solution, Take 2.5 mLs (0.5 mg total) by nebulization 4 (four) times daily. (Patient taking differently: Take 0.5 mg by nebulization every 6 (six) hours as needed for wheezing or shortness of breath. ), Disp: 75 mL, Rfl: 12 .  JARDIANCE 25 MG TABS tablet, Take 25 mg by mouth daily., Disp: , Rfl:  .  levocetirizine (XYZAL) 5 MG tablet, Take 5 mg by mouth every evening., Disp: , Rfl: 1 .  losartan (COZAAR) 50 MG tablet, Take 50 mg by mouth daily. , Disp: , Rfl:  .  losartan-hydrochlorothiazide (HYZAAR) 100-12.5 MG tablet, Take 1 tablet by mouth daily., Disp: , Rfl:  .  meclizine (ANTIVERT) 25 MG tablet, Take 25 mg by mouth 2 (two) times daily as needed for dizziness., Disp: , Rfl:  .  meloxicam (MOBIC) 15 MG tablet, Take 15 mg by mouth daily as needed for pain., Disp: , Rfl:  .  metFORMIN (GLUCOPHAGE-XR) 500 MG 24 hr tablet, Take 1,000 mg by mouth every evening. , Disp: , Rfl:  .  methotrexate (RHEUMATREX) 2.5 MG tablet, Take 10 mg by mouth once a week. On Wednesday. Caution:Chemotherapy. Protect from light., Disp: , Rfl:  .  montelukast (SINGULAIR)  10 MG tablet, Take 10 mg by mouth at bedtime., Disp: , Rfl:  .  naproxen (NAPROSYN) 500 MG tablet, Take 500 mg by mouth 2 (two) times daily as needed for moderate pain. , Disp: , Rfl:  .  SUMAtriptan (IMITREX) 100 MG tablet, Take 100 mg by mouth every 2 (two) hours as needed for migraine., Disp: , Rfl:  .  temazepam (RESTORIL) 15 MG capsule, Take 15 mg  by mouth at bedtime., Disp: , Rfl:  .  topiramate (TOPAMAX) 25 MG tablet, Take 75 mg by mouth at bedtime., Disp: , Rfl:  .  Vitamin D, Ergocalciferol, (DRISDOL) 50000 UNITS CAPS, Take 50,000 Units by mouth 3 (three) times a week. Mondays, wednesdays, fridays, Disp: , Rfl:   Allergies  Allergen Reactions  . Iodine Anaphylaxis    "Seafood allergy"         Objective:  Physical Exam  General: AAO x3, NAD  Dermatological: Skin is warm, dry and supple bilateral.  There are no open sores, no preulcerative lesions, no rash or signs of infection present.  Vascular: Dorsalis Pedis artery and Posterior Tibial artery pedal pulses are 2/4 bilateral with immedate capillary fill time. There is no pain with calf compression, swelling, warmth, erythema.   Neruologic: Grossly intact via light touch bilateral.  Semmes Weinstein monofilament sensation intact.  Negative Tinel sign.  Musculoskeletal: Moderate bunion is present in the left foot.  Tenderness palpation on medial first metatarsal along the area department bunion.  The right foot there is tenderness submetatarsal 1 on the sesamoid complex.  There is no edema, erythema bilaterally.  Range of motion of first MPJ is intact.  No crepitation with range of motion restrictions.  No hypermobility the first ray.  Currently no pain on the course or insertion of plantar fascial.  Muscular strength 5/5 in all groups tested bilateral.  Gait: Unassisted, Nonantalgic.       Assessment:   Left foot bunion, right foot sesamoiditis; history of plantar fasciitis    Plan:  -Treatment options discussed including  all alternatives, risks, and complications -Etiology of symptoms were discussed -X-rays were obtained and reviewed with the patient.  Moderately is present bilaterally.  No evidence of acute fracture.  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 -We discussed both conservative also surgical intervention.  This time she has not had any conservative treatment and start with this.  In regards to the lateral foot discomfort we discussed shoe modifications and orthotics.  We will check orthotic coverage for her.  Discussed offloading pads to the bunion as well as sesamoids of the right foot.  Voltaren gel as needed.  Offered steroid injection but we hold off on this today.  Vivi Barrack DPM

## 2020-10-16 ENCOUNTER — Other Ambulatory Visit: Payer: Self-pay | Admitting: Family Medicine

## 2020-10-16 DIAGNOSIS — Z1231 Encounter for screening mammogram for malignant neoplasm of breast: Secondary | ICD-10-CM

## 2021-01-02 ENCOUNTER — Ambulatory Visit: Payer: BC Managed Care – PPO

## 2021-01-13 ENCOUNTER — Other Ambulatory Visit: Payer: Self-pay

## 2021-01-13 ENCOUNTER — Ambulatory Visit
Admission: RE | Admit: 2021-01-13 | Discharge: 2021-01-13 | Disposition: A | Discharge status: Home / Self Care | Payer: BC Managed Care – PPO | Source: Ambulatory Visit | Attending: Family Medicine | Admitting: Family Medicine

## 2021-01-13 DIAGNOSIS — Z1231 Encounter for screening mammogram for malignant neoplasm of breast: Secondary | ICD-10-CM

## 2021-02-06 ENCOUNTER — Ambulatory Visit: Payer: BC Managed Care – PPO | Admitting: Podiatry

## 2022-03-19 ENCOUNTER — Encounter (HOSPITAL_BASED_OUTPATIENT_CLINIC_OR_DEPARTMENT_OTHER): Payer: Self-pay

## 2022-03-19 ENCOUNTER — Emergency Department (HOSPITAL_BASED_OUTPATIENT_CLINIC_OR_DEPARTMENT_OTHER): Payer: Medicaid Other

## 2022-03-19 ENCOUNTER — Emergency Department (HOSPITAL_BASED_OUTPATIENT_CLINIC_OR_DEPARTMENT_OTHER)
Admission: EM | Admit: 2022-03-19 | Discharge: 2022-03-19 | Disposition: A | Discharge status: Home / Self Care | Payer: Medicaid Other | Attending: Emergency Medicine | Admitting: Emergency Medicine

## 2022-03-19 ENCOUNTER — Other Ambulatory Visit: Payer: Self-pay

## 2022-03-19 DIAGNOSIS — Z1152 Encounter for screening for COVID-19: Secondary | ICD-10-CM | POA: Diagnosis not present

## 2022-03-19 DIAGNOSIS — Z794 Long term (current) use of insulin: Secondary | ICD-10-CM | POA: Diagnosis not present

## 2022-03-19 DIAGNOSIS — Z7982 Long term (current) use of aspirin: Secondary | ICD-10-CM | POA: Insufficient documentation

## 2022-03-19 DIAGNOSIS — J40 Bronchitis, not specified as acute or chronic: Secondary | ICD-10-CM | POA: Insufficient documentation

## 2022-03-19 DIAGNOSIS — R001 Bradycardia, unspecified: Secondary | ICD-10-CM | POA: Insufficient documentation

## 2022-03-19 DIAGNOSIS — J4541 Moderate persistent asthma with (acute) exacerbation: Secondary | ICD-10-CM | POA: Insufficient documentation

## 2022-03-19 DIAGNOSIS — R059 Cough, unspecified: Secondary | ICD-10-CM | POA: Diagnosis present

## 2022-03-19 LAB — CBC
HCT: 38.6 % (ref 36.0–46.0)
Hemoglobin: 12.8 g/dL (ref 12.0–15.0)
MCH: 31.4 pg (ref 26.0–34.0)
MCHC: 33.2 g/dL (ref 30.0–36.0)
MCV: 94.8 fL (ref 80.0–100.0)
Platelets: 149 10*3/uL — ABNORMAL LOW (ref 150–400)
RBC: 4.07 MIL/uL (ref 3.87–5.11)
RDW: 11.9 % (ref 11.5–15.5)
WBC: 5.6 10*3/uL (ref 4.0–10.5)
nRBC: 0 % (ref 0.0–0.2)

## 2022-03-19 LAB — BASIC METABOLIC PANEL
Anion gap: 4 — ABNORMAL LOW (ref 5–15)
BUN: 7 mg/dL (ref 6–20)
CO2: 31 mmol/L (ref 22–32)
Calcium: 8.9 mg/dL (ref 8.9–10.3)
Chloride: 102 mmol/L (ref 98–111)
Creatinine, Ser: 0.79 mg/dL (ref 0.44–1.00)
GFR, Estimated: >60 mL/min (ref >60)
Glucose, Bld: 164 mg/dL — ABNORMAL HIGH (ref 70–99)
Potassium: 3.8 mmol/L (ref 3.5–5.1)
Sodium: 137 mmol/L (ref 135–145)

## 2022-03-19 LAB — RESP PANEL BY RT-PCR (RSV, FLU A&B, COVID)  RVPGX2
Influenza A by PCR: NEGATIVE
Influenza B by PCR: NEGATIVE
Resp Syncytial Virus by PCR: NEGATIVE
SARS Coronavirus 2 by RT PCR: NEGATIVE

## 2022-03-19 LAB — PREGNANCY, URINE: Preg Test, Ur: NEGATIVE

## 2022-03-19 LAB — TROPONIN I (HIGH SENSITIVITY): Troponin I (High Sensitivity): 8 ng/L (ref <18)

## 2022-03-19 MED ORDER — IPRATROPIUM-ALBUTEROL 0.5-2.5 (3) MG/3ML IN SOLN
3.0000 mL | Freq: Once | RESPIRATORY_TRACT | Status: AC
  Administered 2022-03-19: 3 mL via RESPIRATORY_TRACT
  Filled 2022-03-19: qty 3

## 2022-03-19 MED ORDER — HYDROCODONE BIT-HOMATROP MBR 5-1.5 MG/5ML PO SOLN: 5.0000 mL | Freq: Four times a day (QID) | ORAL | 0 refills | Status: DC | PRN

## 2022-03-19 MED ORDER — AZITHROMYCIN 250 MG PO TABS
500.0000 mg | ORAL_TABLET | Freq: Once | ORAL | Status: AC
  Administered 2022-03-19: 500 mg via ORAL
  Filled 2022-03-19: qty 2

## 2022-03-19 MED ORDER — ALBUTEROL SULFATE (2.5 MG/3ML) 0.083% IN NEBU: 2.5000 mg | INHALATION_SOLUTION | Freq: Four times a day (QID) | RESPIRATORY_TRACT | 0 refills | Status: AC | PRN

## 2022-03-19 MED ORDER — AZITHROMYCIN 250 MG PO TABS: 250.0000 mg | ORAL_TABLET | Freq: Every day | ORAL | 0 refills | Status: DC

## 2022-03-19 MED ORDER — PREDNISONE 20 MG PO TABS: ORAL_TABLET | ORAL | 0 refills | Status: DC

## 2022-03-19 MED ORDER — PREDNISONE 50 MG PO TABS
60.0000 mg | ORAL_TABLET | Freq: Once | ORAL | Status: AC
  Administered 2022-03-19: 60 mg via ORAL
  Filled 2022-03-19: qty 1

## 2022-03-19 MED ORDER — BENZONATATE 100 MG PO CAPS
200.0000 mg | ORAL_CAPSULE | Freq: Once | ORAL | Status: AC
  Administered 2022-03-19: 200 mg via ORAL
  Filled 2022-03-19: qty 2

## 2022-03-19 MED ORDER — ALBUTEROL SULFATE HFA 108 (90 BASE) MCG/ACT IN AERS: 1.0000 | INHALATION_SPRAY | Freq: Four times a day (QID) | RESPIRATORY_TRACT | 1 refills | Status: AC | PRN

## 2022-03-19 MED ORDER — BENZONATATE 100 MG PO CAPS: 200.0000 mg | ORAL_CAPSULE | Freq: Three times a day (TID) | ORAL | 0 refills | Status: DC | PRN

## 2022-03-19 NOTE — ED Triage Notes (Signed)
Pt with hx of asthma coming in today with CP and cough. Pt reports she took Mucinex and her inhaler today with no relief. Pt with sweats last week, unsure if she has fevers. Pt was around people who were coughing last week. Denies other sx.

## 2022-03-19 NOTE — ED Notes (Signed)
Pt feeling much better after breathing tx

## 2022-03-19 NOTE — ED Provider Notes (Signed)
[facility] EMERGENCY DEPARTMENT Provider Note   CSN: [id number] Arrival date & time: 03/19/22  1824     History  Chief Complaint  Patient presents with   Chest Pain   Cough    [name] [name] is a 56 y.o. female.  HPI Reports she has had a cough for a week.  She reports at onset she was having some hot and cold episodes and believes she had a fever.  She did not measure her temperature.  She reports cough has become productive of yellow sputum.  She reports she has been wheezing and using her albuterol for her asthma but continued to have wheezing and then got chest pain with cough and deep breath in the front of her chest on the right.  Reports she also had nasal congestion at onset.  She denies any nausea vomiting or abdominal pain.  No lower extremity swelling or calf pain.  Patient has not had recent antibiotics or steroid therapy.    Home Medications Prior to Admission medications   Medication Sig Start Date End Date Taking? Authorizing Provider  albuterol (PROVENTIL) (2.5 MG/3ML) 0.083% nebulizer solution Take 3 mLs (2.5 mg total) by nebulization every 6 (six) hours as needed for wheezing or shortness of breath. 03/19/22  Yes Arby Barrette, MD  albuterol (VENTOLIN HFA) 108 (90 Base) MCG/ACT inhaler Inhale 1-2 puffs into the lungs every 6 (six) hours as needed for wheezing or shortness of breath. 03/19/22  Yes Arby Barrette, MD  azithromycin (ZITHROMAX) 250 MG tablet Take 1 tablet (250 mg total) by mouth daily. 03/19/22  Yes Arby Barrette, MD  benzonatate (TESSALON PERLES) 100 MG capsule Take 2 capsules (200 mg total) by mouth 3 (three) times daily as needed for cough. 03/19/22  Yes Maeley Matton, Lebron Conners, MD  HYDROcodone bit-homatropine (HYCODAN) 5-1.5 MG/5ML syrup Take 5 mLs by mouth every 6 (six) hours as needed for cough. 03/19/22  Yes Arby Barrette, MD  predniSONE (DELTASONE) 20 MG tablet 2 tabs po daily x 4 days 03/19/22  Yes Alamin Mccuiston, Lebron Conners, MD   albuterol (PROVENTIL HFA;VENTOLIN HFA) 108 (90 BASE) MCG/ACT inhaler Inhale 2 puffs into the lungs every 6 (six) hours as needed for wheezing or shortness of breath.    [provider]  albuterol (PROVENTIL) (2.5 MG/3ML) 0.083% nebulizer solution Take 2.5 mg by nebulization every 6 (six) hours as needed for wheezing or shortness of breath.    [provider]  ALPRAZolam Prudy Feeler) 0.5 MG tablet Take 0.5 mg by mouth at bedtime as needed for sleep or anxiety.    [provider]  aspirin 81 MG tablet Take 81 mg by mouth daily.    [provider]  B-D ULTRAFINE III SHORT PEN 31G X 8 MM MISC See admin instructions. 09/19/14   [provider]  BREO ELLIPTA 200-25 MCG/INH AEPB Inhale 1 puff into the lungs daily. 03/07/20   [provider]  budesonide-formoterol (SYMBICORT) 160-4.5 MCG/ACT inhaler Inhale 2 puffs into the lungs 2 (two) times daily.    [provider]  cyclobenzaprine (FLEXERIL) 10 MG tablet Take 10 mg by mouth 3 (three) times daily as needed for muscle spasms.    [provider]  diphenhydrAMINE (BENADRYL) 25 MG tablet Take 2 tablets (50 mg total) by mouth every 6 (six) hours as needed for allergies. 03/19/17   Arby Barrette, MD  DULoxetine (CYMBALTA) 60 MG capsule Take 60 mg by mouth daily.    [provider]  ENBREL MINI 50 MG/ML SOCT Inject into  the skin. 05/09/20   [provider]  EPINEPHrine (EPIPEN 2-PAK) 0.3 mg/0.3 mL IJ SOAJ injection as needed. 10/25/12   [provider]  esomeprazole (NEXIUM) 40 MG capsule Take 1 capsule (40 mg total) by mouth 2 (two) times daily before a meal. 02/11/15   Turner, Cornelious Bryantraci R, MD  estradiol (CLIMARA - DOSED IN MG/24 HR) 0.1 mg/24hr patch Place 0.1 mg onto the skin once a week.    [provider]  folic acid (FOLVITE) 1 MG tablet Take 1 mg by mouth daily.  07/24/14   [provider]  gabapentin (NEURONTIN) 300 MG capsule Take 300 mg by mouth 3  (three) times daily.    [provider]  HYDROcodone-acetaminophen (NORCO/VICODIN) 5-325 MG tablet Take 1-2 tablets by mouth every 6 (six) hours as needed for moderate pain. Patient not taking: Reported on 03/19/2017 06/10/15   Cathren LaineSteinl, Kevin, MD  insulin degludec (TRESIBA FLEXTOUCH) 100 UNIT/ML SOPN FlexTouch Pen Inject 29-30 Units into the skin daily at 10 pm.    [provider]  insulin glargine (LANTUS) 100 UNIT/ML injection Inject 30 Units into the skin at bedtime.    [provider]  ipratropium (ATROVENT) 0.02 % nebulizer solution Take 2.5 mLs (0.5 mg total) by nebulization 4 (four) times daily. Patient taking differently: Take 0.5 mg by nebulization every 6 (six) hours as needed for wheezing or shortness of breath.  04/12/14   Arby BarrettePfeiffer, Leitha Hyppolite, MD  JARDIANCE 25 MG TABS tablet Take 25 mg by mouth daily. 03/20/20   [provider]  levocetirizine (XYZAL) 5 MG tablet Take 5 mg by mouth every evening. 09/08/14   [provider]  losartan (COZAAR) 50 MG tablet Take 50 mg by mouth daily.  07/24/14   [provider]  losartan-hydrochlorothiazide (HYZAAR) 100-12.5 MG tablet Take 1 tablet by mouth daily. 05/24/20   [provider]  meclizine (ANTIVERT) 25 MG tablet Take 25 mg by mouth 2 (two) times daily as needed for dizziness.    [provider]  meloxicam (MOBIC) 15 MG tablet Take 15 mg by mouth daily as needed for pain.    [provider]  metFORMIN (GLUCOPHAGE-XR) 500 MG 24 hr tablet Take 1,000 mg by mouth every evening.  03/11/15   [provider]  methotrexate (RHEUMATREX) 2.5 MG tablet Take 10 mg by mouth once a week. On Wednesday. Caution:Chemotherapy. Protect from light.    [provider]  montelukast (SINGULAIR) 10 MG tablet Take 10 mg by mouth at bedtime.    [provider]  naproxen (NAPROSYN) 500 MG tablet Take 500 mg by mouth 2 (two) times daily as needed for moderate pain.  09/06/14    [provider]  SUMAtriptan (IMITREX) 100 MG tablet Take 100 mg by mouth every 2 (two) hours as needed for migraine.    [provider]  temazepam (RESTORIL) 15 MG capsule Take 15 mg by mouth at bedtime.    [provider]  topiramate (TOPAMAX) 25 MG tablet Take 75 mg by mouth at bedtime.    [provider]  Vitamin D, Ergocalciferol, (DRISDOL) 50000 UNITS CAPS Take 50,000 Units by mouth 3 (three) times a week. Mondays, wednesdays, fridays    [provider]      Allergies    Iodine    Review of Systems   Review of Systems  Physical Exam Updated Vital Signs BP 134/74   Pulse 60   Temp 98.2 F (36.8 C) (Oral)   Resp 18  Ht 5\' 4"  (1.626 m)   Wt 89.4 kg   SpO2 99%   BMI 33.81 kg/m  Physical Exam Constitutional:      Comments: Alert and nontoxic.  Frequent harsh cough.  No respiratory distress at rest.  HENT:     Mouth/Throat:     Pharynx: Oropharynx is clear.  Eyes:     Extraocular Movements: Extraocular movements intact.  Cardiovascular:     Rate and Rhythm: Normal rate and regular rhythm.  Pulmonary:     Comments: Frequent harsh cough paroxysms.  Expiratory wheeze bilateral lung fields. Abdominal:     General: There is no distension.     Palpations: Abdomen is soft.     Tenderness: There is no abdominal tenderness. There is no guarding.  Musculoskeletal:        General: No swelling or tenderness. Normal range of motion.     Right lower leg: No edema.     Left lower leg: No edema.  Skin:    General: Skin is warm and dry.  Neurological:     General: No focal deficit present.     Mental Status: She is oriented to person, place, and time.     Motor: No weakness.     Coordination: Coordination normal.  Psychiatric:        Mood and Affect: Mood normal.     ED Results / Procedures / Treatments   Labs (all labs ordered are listed, but only abnormal results are displayed) Labs Reviewed  BASIC METABOLIC PANEL - Abnormal;  Notable for the following components:      Result Value   Glucose, Bld 164 (*)    Anion gap 4 (*)    All other components within normal limits  CBC - Abnormal; Notable for the following components:   Platelets 149 (*)    All other components within normal limits  RESP PANEL BY RT-PCR (RSV, FLU A&B, COVID)  RVPGX2  PREGNANCY, URINE  TROPONIN I (HIGH SENSITIVITY)  TROPONIN I (HIGH SENSITIVITY)    EKG EKG Interpretation  Date/Time:  Thursday March 19 2022 19:00:33 EST Ventricular Rate:  59 PR Interval:  150 QRS Duration: 72 QT Interval:  408 QTC Calculation: 403 R Axis:   53 Text Interpretation: Sinus bradycardia Otherwise normal ECG When compared with ECG of 26-Oct-2019 17:15, PREVIOUS ECG IS PRESENT no change from previous Confirmed by 28-Oct-2019 [phone]) on 03/19/2022 8:51:46 PM  Radiology DG Chest 2 View  Result Date: 03/19/2022 CLINICAL DATA:  Chest pain EXAM: CHEST - 2 VIEW COMPARISON:  Chest x-ray dated October 26, 2019 FINDINGS: The heart size and mediastinal contours are within normal limits. Both lungs are clear. The visualized skeletal structures are unremarkable. IMPRESSION: No active cardiopulmonary disease. Electronically Signed   By: October 28, 2019 M.D.   On: 03/19/2022 19:06    Procedures Procedures    Medications Ordered in ED Medications  predniSONE (DELTASONE) tablet 60 mg (60 mg Oral Given 03/19/22 2142)  azithromycin (ZITHROMAX) tablet 500 mg (500 mg Oral Given 03/19/22 2141)  benzonatate (TESSALON) capsule 200 mg (200 mg Oral Given 03/19/22 2142)  ipratropium-albuterol (DUONEB) 0.5-2.5 (3) MG/3ML nebulizer solution 3 mL (3 mLs Nebulization Given 03/19/22 2109)  ipratropium-albuterol (DUONEB) 0.5-2.5 (3) MG/3ML nebulizer solution 3 mL (3 mLs Nebulization Given 03/19/22 2228)    ED Course/ Medical Decision Making/ A&P                           Medical Decision  Making Amount and/or Complexity of Data Reviewed Labs: ordered. Radiology:  ordered.  Risk Prescription drug management.  Patient has history of asthma.  She has had coughing for over a week with productive cough.  Subjective fever.  Exam she has extensive wheezing.  Differential diagnosis includes asthma exacerbation with viral illness versus bacterial pneumonia.  She has pleuritic chest pain after coughing.  Pulmonary embolus considered.  Patient does not have any lower extremity swelling or pain.  No hypoxia or tachycardia.  This time higher suspicion for asthma exacerbation with viral versus bacterial pneumonia.  Chest x-ray reviewed by radiology and also visually reviewed by myself no consolidations or pneumothorax.    22: 17 recheck she has improved.  She reports feeling significantly improved.  Still has some cough.  With auscultation there is continued expiratory wheeze at the bases but improved airflow.  Will repeat 1 DuoNeb and anticipate will be ready for discharge after repeat nebulizer treatment.  23: 15 recheck, much improved airflow.  Patient is feeling significantly improved.  This time stable for discharge.  History of asthma, greater than a week of productive cough will opt to treat for possible atypical pneumonia with Zithromax.  Patient has had significant clinical improvement with treatment of asthma.  Will continue prednisone for the next 4 days.  Refills provided for albuterol nebulizer and inhaler.        Final Clinical Impression(s) / ED Diagnoses Final diagnoses:  Moderate persistent asthma with exacerbation  Bronchitis    Rx / DC Orders ED Discharge Orders          Ordered    predniSONE (DELTASONE) 20 MG tablet        03/19/22 2312    albuterol (VENTOLIN HFA) 108 (90 Base) MCG/ACT inhaler  Every 6 hours PRN        03/19/22 2312    albuterol (PROVENTIL) (2.5 MG/3ML) 0.083% nebulizer solution  Every 6 hours PRN        03/19/22 2312    benzonatate (TESSALON PERLES) 100 MG capsule  3 times daily PRN        03/19/22 2312     HYDROcodone bit-homatropine (HYCODAN) 5-1.5 MG/5ML syrup  Every 6 hours PRN        03/19/22 2312    azithromycin (ZITHROMAX) 250 MG tablet  Daily        03/19/22 2312              Arby Barrette, MD 03/19/22 2316

## 2022-03-19 NOTE — Discharge Instructions (Signed)
1.  You have been given a dose of antibiotic called Zithromax in the emergency department.  Take a tablet daily for the next 4 days. 2.  Start prednisone tomorrow as prescribed.  Prednisone is very helpful with asthma, but can elevate your blood sugars.  Monitor your blood sugars carefully at home while you are taking prednisone. 3.  Use your albuterol inhaler or nebulizer every 4-6 hours while you are having an asthma exacerbation.  May use up to every 2 hours if needed.  Then when symptoms are improved resume your usual schedule. 4.  Return to the emergency department if you have worsening shortness of breath, chest pain or other concerning changes.  Schedule follow-up with your doctor within the next 3 to 5 days.

## 2022-06-12 ENCOUNTER — Encounter: Payer: Self-pay | Admitting: Family Medicine

## 2022-06-12 ENCOUNTER — Other Ambulatory Visit: Payer: Self-pay | Admitting: Family Medicine

## 2022-06-12 DIAGNOSIS — R7401 Elevation of levels of liver transaminase levels: Secondary | ICD-10-CM

## 2022-07-13 ENCOUNTER — Other Ambulatory Visit: Payer: Medicaid Other

## 2022-07-23 ENCOUNTER — Ambulatory Visit
Admission: RE | Admit: 2022-07-23 | Discharge: 2022-07-23 | Disposition: A | Discharge status: Home / Self Care | Payer: Medicaid Other | Source: Ambulatory Visit | Attending: Family Medicine | Admitting: Family Medicine

## 2022-07-23 DIAGNOSIS — R7401 Elevation of levels of liver transaminase levels: Secondary | ICD-10-CM

## 2022-08-19 ENCOUNTER — Ambulatory Visit: Payer: Medicaid Other | Admitting: Infectious Diseases

## 2022-08-28 ENCOUNTER — Other Ambulatory Visit: Payer: Self-pay

## 2022-08-28 ENCOUNTER — Emergency Department (HOSPITAL_BASED_OUTPATIENT_CLINIC_OR_DEPARTMENT_OTHER)
Admission: EM | Admit: 2022-08-28 | Discharge: 2022-08-28 | Disposition: A | Discharge status: Home / Self Care | Payer: Medicaid Other | Attending: Emergency Medicine | Admitting: Emergency Medicine

## 2022-08-28 ENCOUNTER — Encounter (HOSPITAL_BASED_OUTPATIENT_CLINIC_OR_DEPARTMENT_OTHER): Payer: Self-pay

## 2022-08-28 DIAGNOSIS — S199XXA Unspecified injury of neck, initial encounter: Secondary | ICD-10-CM | POA: Diagnosis present

## 2022-08-28 DIAGNOSIS — Z7982 Long term (current) use of aspirin: Secondary | ICD-10-CM | POA: Diagnosis not present

## 2022-08-28 DIAGNOSIS — Z794 Long term (current) use of insulin: Secondary | ICD-10-CM | POA: Insufficient documentation

## 2022-08-28 DIAGNOSIS — S39012A Strain of muscle, fascia and tendon of lower back, initial encounter: Secondary | ICD-10-CM | POA: Insufficient documentation

## 2022-08-28 DIAGNOSIS — Z7984 Long term (current) use of oral hypoglycemic drugs: Secondary | ICD-10-CM | POA: Diagnosis not present

## 2022-08-28 DIAGNOSIS — R519 Headache, unspecified: Secondary | ICD-10-CM | POA: Diagnosis not present

## 2022-08-28 DIAGNOSIS — S161XXA Strain of muscle, fascia and tendon at neck level, initial encounter: Secondary | ICD-10-CM

## 2022-08-28 DIAGNOSIS — Y92410 Unspecified street and highway as the place of occurrence of the external cause: Secondary | ICD-10-CM | POA: Insufficient documentation

## 2022-08-28 MED ORDER — ACETAMINOPHEN 325 MG PO TABS: 650.0000 mg | ORAL_TABLET | Freq: Four times a day (QID) | ORAL | 0 refills | Status: AC | PRN

## 2022-08-28 MED ORDER — KETOROLAC TROMETHAMINE 15 MG/ML IJ SOLN
15.0000 mg | Freq: Once | INTRAMUSCULAR | Status: AC
  Administered 2022-08-28: 15 mg via INTRAMUSCULAR
  Filled 2022-08-28: qty 1

## 2022-08-28 MED ORDER — OXYCODONE-ACETAMINOPHEN 5-325 MG PO TABS
2.0000 | ORAL_TABLET | Freq: Once | ORAL | Status: AC
  Administered 2022-08-28: 2 via ORAL
  Filled 2022-08-28: qty 2

## 2022-08-28 MED ORDER — IBUPROFEN 600 MG PO TABS: 600.0000 mg | ORAL_TABLET | Freq: Three times a day (TID) | ORAL | 0 refills | Status: AC | PRN

## 2022-08-28 MED ORDER — ONDANSETRON 4 MG PO TBDP
4.0000 mg | ORAL_TABLET | Freq: Once | ORAL | Status: AC
  Administered 2022-08-28: 4 mg via ORAL
  Filled 2022-08-28: qty 1

## 2022-08-28 MED ORDER — CYCLOBENZAPRINE HCL 10 MG PO TABS
10.0000 mg | ORAL_TABLET | Freq: Once | ORAL | Status: AC
  Administered 2022-08-28: 10 mg via ORAL
  Filled 2022-08-28: qty 1

## 2022-08-28 MED ORDER — CYCLOBENZAPRINE HCL 10 MG PO TABS: 10.0000 mg | ORAL_TABLET | Freq: Three times a day (TID) | ORAL | 0 refills | Status: AC | PRN

## 2022-08-28 MED ORDER — LIDOCAINE 5 % EX PTCH: 2.0000 | MEDICATED_PATCH | CUTANEOUS | 0 refills | Status: AC
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
## 2022-09-30 ENCOUNTER — Ambulatory Visit: Payer: Medicaid Other | Admitting: Infectious Diseases

## 2022-10-14 ENCOUNTER — Other Ambulatory Visit: Payer: Self-pay

## 2022-10-14 ENCOUNTER — Ambulatory Visit (HOSPITAL_COMMUNITY)
Admission: RE | Admit: 2022-10-14 | Discharge: 2022-10-14 | Disposition: A | Discharge status: Home / Self Care | Payer: Medicaid Other | Source: Ambulatory Visit | Attending: Infectious Diseases | Admitting: Infectious Diseases

## 2022-10-14 ENCOUNTER — Ambulatory Visit (INDEPENDENT_AMBULATORY_CARE_PROVIDER_SITE_OTHER): Payer: Medicaid Other | Admitting: Infectious Diseases

## 2022-10-14 ENCOUNTER — Encounter: Payer: Self-pay | Admitting: Infectious Diseases

## 2022-10-14 VITALS — BP 113/67 | HR 72 | Temp 98.2°F | Resp 24 | Ht 64.0 in | Wt 278.6 lb

## 2022-10-14 DIAGNOSIS — Z227 Latent tuberculosis: Secondary | ICD-10-CM | POA: Diagnosis not present

## 2022-10-14 NOTE — Assessment & Plan Note (Addendum)
I discussed with pt and clarified her story Will send her for CXR. She had CXR (-) in last 5 years.  Repeating skin tests, IGRA is unreliable in patients with a hx of previously treated TB (latent or active).  I've called [facility] to get more information from them.  If CXR is (-), she can proceed with DMARD rx.
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
## 2022-10-29 ENCOUNTER — Telehealth: Payer: Self-pay | Admitting: Infectious Diseases

## 2022-10-29 NOTE — Telephone Encounter (Signed)
Called pt and let her know that her CXR was (-).  She can proceed with treatment.  Will cancel her f/u appt.

## 2022-11-04 ENCOUNTER — Encounter: Payer: Medicaid Other | Admitting: Infectious Diseases

## 2022-11-19 ENCOUNTER — Encounter: Payer: Self-pay | Admitting: Dermatology

## 2022-11-19 ENCOUNTER — Ambulatory Visit (INDEPENDENT_AMBULATORY_CARE_PROVIDER_SITE_OTHER): Payer: Medicaid Other | Admitting: Dermatology

## 2022-11-19 VITALS — BP 129/84

## 2022-11-19 DIAGNOSIS — L918 Other hypertrophic disorders of the skin: Secondary | ICD-10-CM | POA: Diagnosis not present

## 2022-11-19 DIAGNOSIS — L723 Sebaceous cyst: Secondary | ICD-10-CM

## 2022-11-19 DIAGNOSIS — L720 Epidermal cyst: Secondary | ICD-10-CM | POA: Diagnosis not present

## 2022-11-19 MED ORDER — TRIAMCINOLONE ACETONIDE 10 MG/ML IJ SUSP
10.0000 mg | Freq: Once | INTRAMUSCULAR | Status: AC
Start: 2022-11-19 — End: 2022-11-19
  Administered 2022-11-19: 10 mg

## 2022-11-19 NOTE — Progress Notes (Signed)
   New Patient Visit   Subjective  [name] [name] is a 57 y.o. female who presents for the following: growth on the upper back x 4 months. It was itchy, it scabbed and fell off about 1 month ago after scratching at it. It was a 6 out of 10 for itch but is not anymore.  The following portions of the chart were reviewed this encounter and updated as appropriate: medications, allergies, medical history  Review of Systems:  No other skin or systemic complaints except as noted in HPI or Assessment and Plan.  Objective  Well appearing patient in no apparent distress; mood and affect are within normal limits.   A focused examination was performed of the following areas: Upper back  Relevant exam findings are noted in the Assessment and Plan.  Mid Back Mild erythema and central punctum    Assessment & Plan   EPIDERMAL INCLUSION CYST Exam: Subcutaneous nodule at upper back  Benign-appearing. Exam most consistent with an epidermal inclusion cyst. Discussed that a cyst is a benign growth that can grow over time and sometimes get irritated or inflamed. Recommend observation if it is not bothersome. Discussed option of surgical excision to remove it if it is growing, symptomatic, or other changes noted. Please call for new or changing lesions so they can be evaluated.  Advised warm compresses and gentle expression as needed. Discussed excision as needed with Dr. Caralyn Guile   Inflamed epidermoid cyst of skin Mid Back  Intralesional injection - Mid Back Procedure Note Intralesional Injection  Location: upper back  Informed Consent: Discussed risks (infection, pain, bleeding, bruising, thinning of the skin, loss of skin pigment, lack of resolution, and recurrence of lesion) and benefits of the procedure, as well as the alternatives. Informed consent was obtained. Preparation: The area was prepared a standard fashion.  Anesthesia:  Procedure Details: An intralesional injection was  performed with Kenalog 10 mg/cc. 0.2 cc in total were injected. NDC #: 2440-1027-25 Exp: 04/2024  Total number of injections: 1  Plan: The patient was instructed on post-op care. Recommend OTC analgesia as needed for pain.   Related Medications triamcinolone acetonide (KENALOG) 10 MG/ML injection 10 mg    Acrochordons (Skin Tags) - Fleshy, skin-colored pedunculated papules - Benign appearing.  - Observe. - If desired, they can be removed with an in office procedure that is not covered by insurance. - Please call the clinic if you notice any new or changing lesions.  Quoted $150.00 for 4 tags.  Return if symptoms worsen or fail to improve, for Cosmetic Skin tag removal requires coming in 30 miutes prior to appt for numbing cream.  I, Mosetta Anis, CMA, am acting as scribe for [employer], DO.   Documentation: I have reviewed the above documentation for accuracy and completeness, and I agree with the above.  Langston Reusing, DO

## 2022-11-19 NOTE — Patient Instructions (Addendum)
Hello Ms. Larita Fife,  Thank you for visiting our dermatology clinic today. We are committed to supporting you in your journey towards optimal skin health and appreciate your proactive approach.  Here is a summary of the key instructions and recommendations from today's consultation with Dr. Langston Reusing:  - Treatment Administered: An injection of Kenalog (0.72ml of Kenalog 10) was administered into the cyst on your back. This is aimed at reducing inflammation and preventing further enlargement of the cyst.   - Post-Treatment Care: It is important to keep the bandaid on the injection site for 24 hours to ensure the effectiveness of the medication.  - Long-Term Management of Cyst:   - Warm Compresses: Use warm compresses and apply gentle pressure to manage any recurrence of the cyst.   - Avoidance: Do not pick or squeeze the cyst to prevent the risk of infection.   - Surgical Removal: Should you opt for a permanent solution, surgical removal by Dr. Rosanne Gutting is available. Please be informed about the potential for scarring and keloid formation post-surgery.  - Skin Tags:   - Insurance Coverage: Removal of skin tags is considered a cosmetic procedure and is not covered by insurance.   - Scheduling: For those opting for removal, please schedule a cosmetic appointment and specify this request when booking.   - Preparation: Arrive 30 minutes early for the application of numbing cream, if necessary.  Please do not hesitate to reach out with any questions or for further assistance. We look forward to your next visit and are here to support you in all your dermatological needs.  Warm regards,  Dr. [facility] Re[facility]     Important Information  Due to recent changes in healthcare laws, you may see results of your pathology and/or laboratory studies on MyChart before the doctors have had a chance to review them. We understand that in some cases there may be results that are confusing or  concerning to you. Please understand that not all results are received at the same time and often the doctors may need to interpret multiple results in order to provide you with the best plan of care or course of treatment. Therefore, we ask that you please give [place] 2 business days to thoroughly review all your results before contacting the office for clarification. Should we see a critical lab result, you will be contacted sooner.   If You Need Anything After Your Visit  If you have any questions or concerns for your doctor, please call our main line at [phone] If no one answers, please leave a voicemail as directed and we will return your call as soon as possible. Messages left after 4 pm will be answered the following business day.   You may also send [place] a message via MyChart. We typically respond to MyChart messages within 1-2 business days.  For prescription refills, please ask your pharmacy to contact our office. Our fax number is [phone].  If you have an urgent issue when the clinic is closed that cannot wait until the next business day, you can page your doctor at the number below.    Please note that while we do our best to be available for urgent issues outside of office hours, we are not available 24/7.   If you have an urgent issue and are unable to reach [place], you may choose to seek medical care at your doctor's office, retail clinic, urgent care center, or emergency room.  If you have a medical emergency, please  immediately call 911 or go to the emergency department. In the event of inclement weather, please call our main line at [phone] for an update on the status of any delays or closures.  Dermatology Medication Tips: Please keep the boxes that topical medications come in in order to help keep track of the instructions about where and how to use these. Pharmacies typically print the medication instructions only on the boxes and not directly on the medication tubes.    If your medication is too expensive, please contact our office at [phone] or send [place] a message through MyChart.   We are unable to tell what your co-pay for medications will be in advance as this is different depending on your insurance coverage. However, we may be able to find a substitute medication at lower cost or fill out paperwork to get insurance to cover a needed medication.   If a prior authorization is required to get your medication covered by your insurance company, please allow [place] 1-2 business days to complete this process.  Drug prices often vary depending on where the prescription is filled and some pharmacies may offer cheaper prices.  The website www.goodrx.com contains coupons for medications through different pharmacies. The prices here do not account for what the cost may be with help from insurance (it may be cheaper with your insurance), but the website can give you the price if you did not use any insurance.  - You can print the associated coupon and take it with your prescription to the pharmacy.  - You may also stop by our office during regular business hours and pick up a GoodRx coupon card.  - If you need your prescription sent electronically to a different pharmacy, notify our office through [facility] or by phone at [phone]

## 2022-12-10 ENCOUNTER — Other Ambulatory Visit: Payer: Self-pay | Admitting: Family Medicine

## 2022-12-10 DIAGNOSIS — Z0000 Encounter for general adult medical examination without abnormal findings: Secondary | ICD-10-CM

## 2022-12-29 ENCOUNTER — Ambulatory Visit
Admission: RE | Admit: 2022-12-29 | Discharge: 2022-12-29 | Disposition: A | Discharge status: Home / Self Care | Payer: Medicaid Other | Source: Ambulatory Visit | Attending: Family Medicine | Admitting: Family Medicine

## 2022-12-29 DIAGNOSIS — Z0000 Encounter for general adult medical examination without abnormal findings: Secondary | ICD-10-CM

## 2023-01-03 ENCOUNTER — Emergency Department (HOSPITAL_BASED_OUTPATIENT_CLINIC_OR_DEPARTMENT_OTHER)
Admission: EM | Admit: 2023-01-03 | Discharge: 2023-01-04 | Disposition: A | Discharge status: Home / Self Care | Payer: Medicaid Other | Attending: Emergency Medicine | Admitting: Emergency Medicine

## 2023-01-03 ENCOUNTER — Other Ambulatory Visit: Payer: Self-pay

## 2023-01-03 ENCOUNTER — Encounter (HOSPITAL_BASED_OUTPATIENT_CLINIC_OR_DEPARTMENT_OTHER): Payer: Self-pay

## 2023-01-03 DIAGNOSIS — Z79899 Other long term (current) drug therapy: Secondary | ICD-10-CM | POA: Insufficient documentation

## 2023-01-03 DIAGNOSIS — M5440 Lumbago with sciatica, unspecified side: Secondary | ICD-10-CM | POA: Diagnosis not present

## 2023-01-03 DIAGNOSIS — E119 Type 2 diabetes mellitus without complications: Secondary | ICD-10-CM | POA: Diagnosis not present

## 2023-01-03 DIAGNOSIS — R0602 Shortness of breath: Secondary | ICD-10-CM | POA: Diagnosis not present

## 2023-01-03 DIAGNOSIS — Z7982 Long term (current) use of aspirin: Secondary | ICD-10-CM | POA: Diagnosis not present

## 2023-01-03 DIAGNOSIS — Z794 Long term (current) use of insulin: Secondary | ICD-10-CM | POA: Insufficient documentation

## 2023-01-03 DIAGNOSIS — Z7984 Long term (current) use of oral hypoglycemic drugs: Secondary | ICD-10-CM | POA: Insufficient documentation

## 2023-01-03 DIAGNOSIS — M545 Low back pain, unspecified: Secondary | ICD-10-CM | POA: Diagnosis present

## 2023-01-03 MED ORDER — IPRATROPIUM-ALBUTEROL 0.5-2.5 (3) MG/3ML IN SOLN
RESPIRATORY_TRACT | Status: AC
  Administered 2023-01-03: 3 mL via RESPIRATORY_TRACT
  Filled 2023-01-03: qty 3

## 2023-01-03 MED ORDER — HYDROMORPHONE HCL 1 MG/ML IJ SOLN
1.0000 mg | Freq: Once | INTRAMUSCULAR | Status: AC
  Administered 2023-01-03: 1 mg via INTRAVENOUS
  Filled 2023-01-03: qty 1

## 2023-01-03 MED ORDER — ALBUTEROL SULFATE (2.5 MG/3ML) 0.083% IN NEBU: 2.5000 mg | INHALATION_SOLUTION | Freq: Once | RESPIRATORY_TRACT | Status: AC

## 2023-01-03 MED ORDER — IPRATROPIUM-ALBUTEROL 0.5-2.5 (3) MG/3ML IN SOLN: 3.0000 mL | Freq: Once | RESPIRATORY_TRACT | Status: AC

## 2023-01-03 MED ORDER — ALBUTEROL SULFATE (2.5 MG/3ML) 0.083% IN NEBU
INHALATION_SOLUTION | RESPIRATORY_TRACT | Status: AC
  Administered 2023-01-03: 2.5 mg via RESPIRATORY_TRACT
  Filled 2023-01-03: qty 3

## 2023-01-03 MED ORDER — ACETAMINOPHEN 500 MG PO TABS
1000.0000 mg | ORAL_TABLET | Freq: Once | ORAL | Status: AC
  Administered 2023-01-03: 1000 mg via ORAL
  Filled 2023-01-03: qty 2

## 2023-01-03 NOTE — ED Provider Notes (Signed)
EMERGENCY DEPARTMENT AT [facility] Provider Note   CSN: [id number] Arrival date & time: 01/03/23  2241     History {Add pertinent medical, surgical, social history, OB history to HPI:1} Chief Complaint  Patient presents with   Back Pain   Shortness of Breath    [name] [name] is a 57 y.o. female.  57 year old female presents ER today with back pain.  Patient states she has had similar symptoms in the past but not this bad.  She states that she felt a little bit throughout the day when she would twist or turn her lower back muscular in nature.  She states that she stood up straight and it just locked up and has been painful since then.  She does have any neurologic changes just hurts when she walks.  Soundly she told someone she has some shortness of breath but it sounds like she shortness of breath because it hurt when she took a deep breath.  No recent illnesses, fevers, trauma, saddle anesthesia, incontinence no history of IV drug use. Does have h/o diabetes but is well controlled. Has RA as well.    Back Pain Shortness of Breath      Home Medications Prior to Admission medications   Medication Sig Start Date End Date Taking? Authorizing Provider  albuterol (PROVENTIL HFA;VENTOLIN HFA) 108 (90 BASE) MCG/ACT inhaler Inhale 2 puffs into the lungs every 6 (six) hours as needed for wheezing or shortness of breath.    [provider]  albuterol (PROVENTIL) (2.5 MG/3ML) 0.083% nebulizer solution Take 2.5 mg by nebulization every 6 (six) hours as needed for wheezing or shortness of breath.    [provider]  albuterol (PROVENTIL) (2.5 MG/3ML) 0.083% nebulizer solution Take 3 mLs (2.5 mg total) by nebulization every 6 (six) hours as needed for wheezing or shortness of breath. 03/19/22   Arby Barrette, MD  albuterol (VENTOLIN HFA) 108 (90 Base) MCG/ACT inhaler Inhale 1-2 puffs into the lungs every 6 (six) hours as needed for wheezing  or shortness of breath. 03/19/22   Arby Barrette, MD  ALPRAZolam Prudy Feeler) 0.5 MG tablet Take 0.5 mg by mouth at bedtime as needed for sleep or anxiety.    [provider]  aspirin 81 MG tablet Take 81 mg by mouth daily.    [provider]  azithromycin (ZITHROMAX) 250 MG tablet Take 1 tablet (250 mg total) by mouth daily. 03/19/22   Arby Barrette, MD  B-D ULTRAFINE III SHORT PEN 31G X 8 MM MISC See admin instructions. 09/19/14   [provider]  benzonatate (TESSALON PERLES) 100 MG capsule Take 2 capsules (200 mg total) by mouth 3 (three) times daily as needed for cough. 03/19/22   Arby Barrette, MD  BREO ELLIPTA 200-25 MCG/INH AEPB Inhale 1 puff into the lungs daily. 03/07/20   [provider]  budesonide-formoterol (SYMBICORT) 160-4.5 MCG/ACT inhaler Inhale 2 puffs into the lungs 2 (two) times daily.    [provider]  diphenhydrAMINE (BENADRYL) 25 MG tablet Take 2 tablets (50 mg total) by mouth every 6 (six) hours as needed for allergies. 03/19/17   Arby Barrette, MD  DULoxetine (CYMBALTA) 60 MG capsule Take 60 mg by mouth daily.    [provider]  ENBREL MINI 50 MG/ML SOCT Inject into the skin. 05/09/20   [provider]  EPINEPHrine (EPIPEN 2-PAK) 0.3 mg/0.3 mL IJ SOAJ injection as needed. 10/25/12   [provider]  esomeprazole (NEXIUM) 40 MG capsule Take 1 capsule (  40 mg total) by mouth 2 (two) times daily before a meal. 02/11/15   Turner, Cornelious Bryant, MD  estradiol (CLIMARA - DOSED IN MG/24 HR) 0.1 mg/24hr patch Place 0.1 mg onto the skin once a week.    [provider]  folic acid (FOLVITE) 1 MG tablet Take 1 mg by mouth daily.  07/24/14   [provider]  gabapentin (NEURONTIN) 300 MG capsule Take 300 mg by mouth 3 (three) times daily.    [provider]  HYDROcodone bit-homatropine (HYCODAN) 5-1.5 MG/5ML syrup Take 5 mLs by mouth every 6 (six) hours as needed for cough. 03/19/22   Arby Barrette, MD  insulin degludec (TRESIBA FLEXTOUCH) 100 UNIT/ML SOPN FlexTouch Pen Inject 29-30 Units into the skin daily at 10 pm.    [provider]  insulin glargine (LANTUS) 100 UNIT/ML injection Inject 30 Units into the skin at bedtime.    [provider]  ipratropium (ATROVENT) 0.02 % nebulizer solution Take 2.5 mLs (0.5 mg total) by nebulization 4 (four) times daily. Patient taking differently: Take 0.5 mg by nebulization every 6 (six) hours as needed for wheezing or shortness of breath.  04/12/14   Arby Barrette, MD  JARDIANCE 25 MG TABS tablet Take 25 mg by mouth daily. 03/20/20   [provider]  levocetirizine (XYZAL) 5 MG tablet Take 5 mg by mouth every evening. 09/08/14   [provider]  losartan (COZAAR) 50 MG tablet Take 50 mg by mouth daily.  07/24/14   [provider]  losartan-hydrochlorothiazide (HYZAAR) 100-12.5 MG tablet Take 1 tablet by mouth daily. 05/24/20   [provider]  meclizine (ANTIVERT) 25 MG tablet Take 25 mg by mouth 2 (two) times daily as needed for dizziness.    [provider]  metFORMIN (GLUCOPHAGE-XR) 500 MG 24 hr tablet Take 1,000 mg by mouth every evening.  03/11/15   [provider]  methotrexate (RHEUMATREX) 2.5 MG tablet Take 10 mg by mouth once a week. On Wednesday. Caution:Chemotherapy. Protect from light.    [provider]  montelukast (SINGULAIR) 10 MG tablet Take 10 mg by mouth at bedtime.    [provider]  predniSONE (DELTASONE) 20 MG tablet 2 tabs po daily x 4 days 03/19/22   Arby Barrette, MD  SUMAtriptan (IMITREX) 100 MG tablet Take 100 mg by mouth every 2 (two) hours as needed for migraine.    [provider]  temazepam (RESTORIL) 15 MG capsule Take 15 mg by mouth at bedtime.    [provider]  topiramate (TOPAMAX) 25 MG tablet Take 75 mg by mouth at bedtime.    [provider]  Vitamin D, Ergocalciferol, (DRISDOL) 50000 UNITS  CAPS Take 50,000 Units by mouth 3 (three) times a week. Mondays, wednesdays, fridays    [provider]      Allergies    Iodine and Shellfish allergy    Review of Systems   Review of Systems  Respiratory:  Positive for shortness of breath.   Musculoskeletal:  Positive for back pain.    Physical Exam Updated Vital Signs BP (!) 162/77 (BP Location: Right Arm)   Pulse 66   Temp 97.9 F (36.6 C)   Resp 18   Ht 5\' 4"  (1.626 m)   Wt 121.6 kg   SpO2 98%   BMI 46.00 kg/m  Physical Exam Vitals and nursing note reviewed.  Constitutional:      Appearance: She is well-developed.  HENT:     Head: Normocephalic  and atraumatic.  Cardiovascular:     Rate and Rhythm: Normal rate and regular rhythm.  Pulmonary:     Effort: No respiratory distress.     Breath sounds: No stridor.  Abdominal:     General: There is no distension.  Musculoskeletal:     Cervical back: Normal range of motion.     Comments: Tenderness and spasming to lower back.  Lidocaine patch in place.  Good strength in lower extremities.  Difficult to elicit patellar reflexes secondary to positioning.  Sensation to light touch both sides of her lower legs.   Skin:    General: Skin is warm and dry.  Neurological:     General: No focal deficit present.     Mental Status: She is alert.     ED Results / Procedures / Treatments   Labs (all labs ordered are listed, but only abnormal results are displayed) Labs Reviewed  URINALYSIS, ROUTINE W REFLEX MICROSCOPIC  CBC WITH DIFFERENTIAL/PLATELET  COMPREHENSIVE METABOLIC PANEL  MAGNESIUM    EKG None  Radiology No results found.  Procedures Procedures  {Document cardiac monitor, telemetry assessment procedure when appropriate:1}  Medications Ordered in ED Medications  HYDROmorphone (DILAUDID) injection 1 mg (has no administration in time range)  acetaminophen (TYLENOL) tablet 1,000 mg (has no administration in time range)  ipratropium-albuterol  (DUONEB) 0.5-2.5 (3) MG/3ML nebulizer solution 3 mL (3 mLs Nebulization Given 01/03/23 2259)  albuterol (PROVENTIL) (2.5 MG/3ML) 0.083% nebulizer solution 2.5 mg (2.5 mg Nebulization Given 01/03/23 2259)    ED Course/ Medical Decision Making/ A&P   {   Click here for ABCD2, HEART and other calculatorsREFRESH Note before signing :1}                              Medical Decision Making Amount and/or Complexity of Data Reviewed Labs: ordered.  Risk OTC drugs. Prescription drug management.  57 year old female here with likely muscular back pain.  Will check electrolytes and treat symptomatically.  No red flags to indicate concern for any kind space-occupying lesion or fracture requiring imaging.  She has some difficulty walking but it seems to be mostly pain mediated she does not have any obvious weakness on exam. ***  {Document critical care time when appropriate:1} {Document review of labs and clinical decision tools ie heart score, Chads2Vasc2 etc:1}  {Document your independent review of radiology images, and any outside records:1} {Document your discussion with family members, caretakers, and with consultants:1} {Document social determinants of health affecting pt's care:1} {Document your decision making why or why not admission, treatments were needed:1} Final Clinical Impression(s) / ED Diagnoses Final diagnoses:  None    Rx / DC Orders ED Discharge Orders     None

## 2023-01-03 NOTE — ED Triage Notes (Signed)
Pt reports sudden onset of back spasms today. Pt reports the back pain is making it hard for her to breath. Pt with hx of asthma. Pt has used neb treatment at home which has helped with her breathing.

## 2023-01-03 NOTE — ED Notes (Signed)
RT assessed pt in triage for asthma symptoms. Pt respiratory status stable on RA. BLBS clr dim/dim with dyspnea at rest. Pt currently receiving 5/5 treatment. Post  treatment pt BLBS clr/dim. RT will continue to monitor while at [facility].    01/03/23 2302  Therapy Vitals  Resp 18  Patient Position (if appropriate) Sitting  MEWS Score/Color  MEWS Score 0  MEWS Score Color Green  Respiratory Assessment  Assessment Type Pre-treatment  Respiratory Pattern Regular;Unlabored;Symmetrical;Dyspnea at rest  Chest Assessment Chest expansion symmetrical  Cough None  Bilateral Breath Sounds Clear;Diminished  R Upper  Breath Sounds Clear;Diminished  L Upper Breath Sounds Clear;Diminished  R Lower Breath Sounds Diminished  L Lower Breath Sounds Diminished  Oxygen Therapy/Pulse Ox  O2 Device Room Air  O2 Therapy Room air

## 2023-01-04 LAB — CBC WITH DIFFERENTIAL/PLATELET
Abs Immature Granulocytes: 0.01 10*3/uL (ref 0.00–0.07)
Basophils Absolute: 0 10*3/uL (ref 0.0–0.1)
Basophils Relative: 0 %
Eosinophils Absolute: 0.1 10*3/uL (ref 0.0–0.5)
Eosinophils Relative: 2 %
HCT: 41 % (ref 36.0–46.0)
Hemoglobin: 13.4 g/dL (ref 12.0–15.0)
Immature Granulocytes: 0 %
Lymphocytes Relative: 58 %
Lymphs Abs: 3.8 10*3/uL (ref 0.7–4.0)
MCH: 31.3 pg (ref 26.0–34.0)
MCHC: 32.7 g/dL (ref 30.0–36.0)
MCV: 95.8 fL (ref 80.0–100.0)
Monocytes Absolute: 0.5 10*3/uL (ref 0.1–1.0)
Monocytes Relative: 8 %
Neutro Abs: 2.1 10*3/uL (ref 1.7–7.7)
Neutrophils Relative %: 32 %
Platelets: 140 10*3/uL — ABNORMAL LOW (ref 150–400)
RBC: 4.28 MIL/uL (ref 3.87–5.11)
RDW: 12.9 % (ref 11.5–15.5)
WBC: 6.6 10*3/uL (ref 4.0–10.5)
nRBC: 0 % (ref 0.0–0.2)

## 2023-01-04 LAB — COMPREHENSIVE METABOLIC PANEL
ALT: 25 U/L (ref 0–44)
AST: 35 U/L (ref 15–41)
Albumin: 3.9 g/dL (ref 3.5–5.0)
Alkaline Phosphatase: 186 U/L — ABNORMAL HIGH (ref 38–126)
Anion gap: 6 (ref 5–15)
BUN: 12 mg/dL (ref 6–20)
CO2: 29 mmol/L (ref 22–32)
Calcium: 9.4 mg/dL (ref 8.9–10.3)
Chloride: 103 mmol/L (ref 98–111)
Creatinine, Ser: 0.82 mg/dL (ref 0.44–1.00)
GFR, Estimated: >60 mL/min (ref >60)
Glucose, Bld: 155 mg/dL — ABNORMAL HIGH (ref 70–99)
Potassium: 3.5 mmol/L (ref 3.5–5.1)
Sodium: 138 mmol/L (ref 135–145)
Total Bilirubin: 0.7 mg/dL (ref 0.3–1.2)
Total Protein: 7.9 g/dL (ref 6.5–8.1)

## 2023-01-04 LAB — MAGNESIUM: Magnesium: 1.8 mg/dL (ref 1.7–2.4)

## 2023-01-04 MED ORDER — MELOXICAM 15 MG PO TABS: 15.0000 mg | ORAL_TABLET | Freq: Every day | ORAL | 0 refills | Status: AC

## 2023-01-04 MED ORDER — CYCLOBENZAPRINE HCL 10 MG PO TABS: 10.0000 mg | ORAL_TABLET | Freq: Two times a day (BID) | ORAL | 0 refills | Status: AC | PRN

## 2023-01-04 MED ORDER — KETOROLAC TROMETHAMINE 30 MG/ML IJ SOLN
15.0000 mg | Freq: Once | INTRAMUSCULAR | Status: AC
  Administered 2023-01-04: 15 mg via INTRAVENOUS
  Filled 2023-01-04: qty 1

## 2023-01-04 MED ORDER — CYCLOBENZAPRINE HCL 10 MG PO TABS
10.0000 mg | ORAL_TABLET | Freq: Once | ORAL | Status: AC
  Administered 2023-01-04: 10 mg via ORAL
  Filled 2023-01-04: qty 1

## 2023-03-23 ENCOUNTER — Emergency Department (HOSPITAL_BASED_OUTPATIENT_CLINIC_OR_DEPARTMENT_OTHER)
Admission: EM | Admit: 2023-03-23 | Discharge: 2023-03-23 | Disposition: A | Discharge status: Home / Self Care | Payer: Medicaid Other | Attending: Emergency Medicine | Admitting: Emergency Medicine

## 2023-03-23 ENCOUNTER — Encounter (HOSPITAL_BASED_OUTPATIENT_CLINIC_OR_DEPARTMENT_OTHER): Payer: Self-pay

## 2023-03-23 ENCOUNTER — Emergency Department (HOSPITAL_BASED_OUTPATIENT_CLINIC_OR_DEPARTMENT_OTHER): Payer: Medicaid Other

## 2023-03-23 ENCOUNTER — Other Ambulatory Visit: Payer: Self-pay

## 2023-03-23 ENCOUNTER — Emergency Department (HOSPITAL_BASED_OUTPATIENT_CLINIC_OR_DEPARTMENT_OTHER): Payer: Medicaid Other | Admitting: Radiology

## 2023-03-23 DIAGNOSIS — E119 Type 2 diabetes mellitus without complications: Secondary | ICD-10-CM | POA: Diagnosis not present

## 2023-03-23 DIAGNOSIS — Z794 Long term (current) use of insulin: Secondary | ICD-10-CM | POA: Insufficient documentation

## 2023-03-23 DIAGNOSIS — R42 Dizziness and giddiness: Secondary | ICD-10-CM | POA: Insufficient documentation

## 2023-03-23 DIAGNOSIS — Z7982 Long term (current) use of aspirin: Secondary | ICD-10-CM | POA: Insufficient documentation

## 2023-03-23 DIAGNOSIS — Z7984 Long term (current) use of oral hypoglycemic drugs: Secondary | ICD-10-CM | POA: Insufficient documentation

## 2023-03-23 DIAGNOSIS — Z79899 Other long term (current) drug therapy: Secondary | ICD-10-CM | POA: Insufficient documentation

## 2023-03-23 DIAGNOSIS — R0789 Other chest pain: Secondary | ICD-10-CM | POA: Insufficient documentation

## 2023-03-23 DIAGNOSIS — I10 Essential (primary) hypertension: Secondary | ICD-10-CM | POA: Insufficient documentation

## 2023-03-23 LAB — CBC
HCT: 41.7 % (ref 36.0–46.0)
Hemoglobin: 13.7 g/dL (ref 12.0–15.0)
MCH: 31.6 pg (ref 26.0–34.0)
MCHC: 32.9 g/dL (ref 30.0–36.0)
MCV: 96.3 fL (ref 80.0–100.0)
Platelets: 139 10*3/uL — ABNORMAL LOW (ref 150–400)
RBC: 4.33 MIL/uL (ref 3.87–5.11)
RDW: 12.6 % (ref 11.5–15.5)
WBC: 4 10*3/uL (ref 4.0–10.5)
nRBC: 0 % (ref 0.0–0.2)

## 2023-03-23 LAB — BASIC METABOLIC PANEL
Anion gap: 5 (ref 5–15)
BUN: 7 mg/dL (ref 6–20)
CO2: 28 mmol/L (ref 22–32)
Calcium: 9.3 mg/dL (ref 8.9–10.3)
Chloride: 107 mmol/L (ref 98–111)
Creatinine, Ser: 0.68 mg/dL (ref 0.44–1.00)
GFR, Estimated: >60 mL/min (ref >60)
Glucose, Bld: 92 mg/dL (ref 70–99)
Potassium: 4 mmol/L (ref 3.5–5.1)
Sodium: 140 mmol/L (ref 135–145)

## 2023-03-23 LAB — D-DIMER, QUANTITATIVE: D-Dimer, Quant: 0.6 ug{FEU}/mL — ABNORMAL HIGH (ref 0.00–0.50)

## 2023-03-23 LAB — TROPONIN I (HIGH SENSITIVITY)
Troponin I (High Sensitivity): 4 ng/L (ref <18)
Troponin I (High Sensitivity): 6 ng/L (ref <18)

## 2023-03-23 MED ORDER — DIPHENHYDRAMINE HCL 25 MG PO CAPS: 50.0000 mg | ORAL_CAPSULE | Freq: Once | ORAL | Status: AC

## 2023-03-23 MED ORDER — KETOROLAC TROMETHAMINE 30 MG/ML IJ SOLN
15.0000 mg | Freq: Once | INTRAMUSCULAR | Status: AC
  Administered 2023-03-23: 15 mg via INTRAVENOUS
  Filled 2023-03-23: qty 1

## 2023-03-23 MED ORDER — METHYLPREDNISOLONE SODIUM SUCC 40 MG IJ SOLR
40.0000 mg | Freq: Once | INTRAMUSCULAR | Status: AC
  Administered 2023-03-23: 40 mg via INTRAVENOUS
  Filled 2023-03-23: qty 1

## 2023-03-23 MED ORDER — DIPHENHYDRAMINE HCL 50 MG/ML IJ SOLN
50.0000 mg | Freq: Once | INTRAMUSCULAR | Status: AC
  Administered 2023-03-23: 50 mg via INTRAVENOUS
  Filled 2023-03-23: qty 1

## 2023-03-23 MED ORDER — IOHEXOL 350 MG/ML SOLN
100.0000 mL | Freq: Once | INTRAVENOUS | Status: AC | PRN
  Administered 2023-03-23: 75 mL via INTRAVENOUS

## 2023-03-23 NOTE — ED Triage Notes (Signed)
C/o left sided CP that rads into left arm and neck. starting last night. Took asa last night w/ no relief. Denies cardiac related hx.

## 2023-03-23 NOTE — ED Provider Notes (Signed)
[place] EMERGENCY DEPARTMENT AT [facility] Provider Note   CSN: [id number] Arrival date & time: 03/23/23  1511     History  Chief Complaint  Patient presents with   Chest Pain    [name] [name] is a 57 y.o. female.  HPI    57 year old female comes in with chief complaint of chest pain. Patient has history of hypertension, hyperlipidemia, diabetes.  She started noticing chest pain over the left side yesterday.  Chest pain is constant, worse with deep inspiration.  No history of similar pain in the past.  Pain is not exertional.  She denies any associated nausea, vomiting.  Patient did however feel dizzy when she was walking earlier today.  Currently patient is not on hormone replacement therapy, but she has taken it in the past.  Home Medications Prior to Admission medications   Medication Sig Start Date End Date Taking? Authorizing Provider  albuterol (PROVENTIL HFA;VENTOLIN HFA) 108 (90 BASE) MCG/ACT inhaler Inhale 2 puffs into the lungs every 6 (six) hours as needed for wheezing or shortness of breath.    [provider]  albuterol (PROVENTIL) (2.5 MG/3ML) 0.083% nebulizer solution Take 2.5 mg by nebulization every 6 (six) hours as needed for wheezing or shortness of breath.    [provider]  albuterol (PROVENTIL) (2.5 MG/3ML) 0.083% nebulizer solution Take 3 mLs (2.5 mg total) by nebulization every 6 (six) hours as needed for wheezing or shortness of breath. 03/19/22   Arby Barrette, MD  albuterol (VENTOLIN HFA) 108 (90 Base) MCG/ACT inhaler Inhale 1-2 puffs into the lungs every 6 (six) hours as needed for wheezing or shortness of breath. 03/19/22   Arby Barrette, MD  ALPRAZolam Prudy Feeler) 0.5 MG tablet Take 0.5 mg by mouth at bedtime as needed for sleep or anxiety.    [provider]  aspirin 81 MG tablet Take 81 mg by mouth daily.    [provider]  B-D ULTRAFINE III SHORT PEN 31G X 8 MM MISC See admin  instructions. 09/19/14   [provider]  BREO ELLIPTA 200-25 MCG/INH AEPB Inhale 1 puff into the lungs daily. 03/07/20   [provider]  budesonide-formoterol (SYMBICORT) 160-4.5 MCG/ACT inhaler Inhale 2 puffs into the lungs 2 (two) times daily.    [provider]  cyclobenzaprine (FLEXERIL) 10 MG tablet Take 1 tablet (10 mg total) by mouth 2 (two) times daily as needed for muscle spasms. 01/04/23   Mesner, Barbara Cower, MD  DULoxetine (CYMBALTA) 60 MG capsule Take 60 mg by mouth daily.    [provider]  ENBREL MINI 50 MG/ML SOCT Inject into the skin. 05/09/20   [provider]  EPINEPHrine (EPIPEN 2-PAK) 0.3 mg/0.3 mL IJ SOAJ injection as needed. 10/25/12   [provider]  esomeprazole (NEXIUM) 40 MG capsule Take 1 capsule (40 mg total) by mouth 2 (two) times daily before a meal. 02/11/15   Turner, Cornelious Bryant, MD  estradiol (CLIMARA - DOSED IN MG/24 HR) 0.1 mg/24hr patch Place 0.1 mg onto the skin once a week.    [provider]  folic acid (FOLVITE) 1 MG tablet Take 1 mg by mouth daily.  07/24/14   [provider]  insulin degludec (TRESIBA FLEXTOUCH) 100 UNIT/ML SOPN FlexTouch Pen Inject 29-30 Units into the skin daily at 10 pm.    [provider]  insulin glargine (LANTUS) 100 UNIT/ML injection Inject 30 Units into the skin at bedtime.    [provider]  ipratropium (ATROVENT) 0.02 %  nebulizer solution Take 2.5 mLs (0.5 mg total) by nebulization 4 (four) times daily. Patient taking differently: Take 0.5 mg by nebulization every 6 (six) hours as needed for wheezing or shortness of breath.  04/12/14   Arby Barrette, MD  JARDIANCE 25 MG TABS tablet Take 25 mg by mouth daily. 03/20/20   [provider]  levocetirizine (XYZAL) 5 MG tablet Take 5 mg by mouth every evening. 09/08/14   [provider]  losartan (COZAAR) 50 MG tablet Take 50 mg by mouth daily.  07/24/14   [provider]   losartan-hydrochlorothiazide (HYZAAR) 100-12.5 MG tablet Take 1 tablet by mouth daily. 05/24/20   [provider]  meclizine (ANTIVERT) 25 MG tablet Take 25 mg by mouth 2 (two) times daily as needed for dizziness.    [provider]  meloxicam (MOBIC) 15 MG tablet Take 1 tablet (15 mg total) by mouth daily. 01/04/23   Mesner, Barbara Cower, MD  metFORMIN (GLUCOPHAGE-XR) 500 MG 24 hr tablet Take 1,000 mg by mouth every evening.  03/11/15   [provider]  methotrexate (RHEUMATREX) 2.5 MG tablet Take 10 mg by mouth once a week. On Wednesday. Caution:Chemotherapy. Protect from light.    [provider]  montelukast (SINGULAIR) 10 MG tablet Take 10 mg by mouth at bedtime.    [provider]  predniSONE (DELTASONE) 20 MG tablet 2 tabs po daily x 4 days 03/19/22   Arby Barrette, MD  topiramate (TOPAMAX) 25 MG tablet Take 75 mg by mouth at bedtime.    [provider]  Vitamin D, Ergocalciferol, (DRISDOL) 50000 UNITS CAPS Take 50,000 Units by mouth 3 (three) times a week. Mondays, wednesdays, fridays    [provider]      Allergies    Iodine and Shellfish allergy    Review of Systems   Review of Systems  All other systems reviewed and are negative.   Physical Exam Updated Vital Signs BP 120/62   Pulse (!) 50   Temp 97.9 F (36.6 C) (Oral)   Resp 14   Ht 5\' 4"  (1.626 m)   Wt 90.7 kg   SpO2 98%   BMI 34.33 kg/m  Physical Exam Vitals and nursing note reviewed.  Constitutional:      Appearance: She is well-developed.  HENT:     Head: Atraumatic.  Cardiovascular:     Rate and Rhythm: Normal rate.  Pulmonary:     Effort: Pulmonary effort is normal.  Musculoskeletal:     Cervical back: Normal range of motion and neck supple.     Right lower leg: No edema.     Left lower leg: No edema.  Skin:    General: Skin is warm and dry.  Neurological:     Mental Status: She is alert and oriented to person, place, and time.     ED  Results / Procedures / Treatments   Labs (all labs ordered are listed, but only abnormal results are displayed) Labs Reviewed  CBC - Abnormal; Notable for the following components:      Result Value   Platelets 139 (*)    All other components within normal limits  D-DIMER, QUANTITATIVE - Abnormal; Notable for the following components:   D-Dimer, Quant 0.60 (*)    All other components within normal limits  BASIC METABOLIC PANEL  TROPONIN I (HIGH SENSITIVITY)  TROPONIN I (HIGH SENSITIVITY)    EKG EKG Interpretation Date/Time:  Tuesday March 23 2023 15:21:57 EST Ventricular Rate:  60 PR Interval:  168 QRS Duration:  76 QT Interval:  412 QTC Calculation: 412 R Axis:   8  Text Interpretation: Normal sinus rhythm Possible Anterior infarct , age undetermined Abnormal ECG When compared with ECG of 19-Mar-2022 19:00, No significant change was found No acute changes No significant change since last tracing Confirmed by Derwood Kaplan [phone]) on 03/23/2023 4:43:14 PM  Radiology CT Angio Chest PE W and/or Wo Contrast Result Date: 03/23/2023 CLINICAL DATA:  Pulmonary embolus suspected with high probability. Left-sided chest pain radiating to the arm and neck starting last night. EXAM: CT ANGIOGRAPHY CHEST WITH CONTRAST TECHNIQUE: Multidetector CT imaging of the chest was performed using the standard protocol during bolus administration of intravenous contrast. Multiplanar CT image reconstructions and MIPs were obtained to evaluate the vascular anatomy. RADIATION DOSE REDUCTION: This exam was performed according to the departmental dose-optimization program which includes automated exposure control, adjustment of the mA and/or kV according to patient size and/or use of iterative reconstruction technique. CONTRAST:  75mL OMNIPAQUE IOHEXOL 350 MG/ML SOLN COMPARISON:  Chest radiograph 03/23/2023 FINDINGS: Cardiovascular: Technically adequate study with moderately good opacification of the central  and segmental pulmonary arteries and mild motion artifact. No focal filling defects are demonstrated. No evidence of significant central pulmonary embolus. Peripheral vessels may be obscured from visualization. Normal heart size. No pericardial effusions. Normal caliber thoracic aorta. No dissection. Great vessel origins are patent. Mediastinum/Nodes: No enlarged mediastinal, hilar, or axillary lymph nodes. Thyroid gland, trachea, and esophagus demonstrate no significant findings. Lungs/Pleura: Lungs are clear. No pleural effusion or pneumothorax. Upper Abdomen: Surgical absence of the gallbladder. No acute abnormalities. Somewhat nodular contour to the liver may indicate cirrhosis. Musculoskeletal: Degenerative changes in the spine. No acute bony abnormalities. Review of the MIP images confirms the above findings. IMPRESSION: 1. No evidence of significant central pulmonary embolus. 2. No evidence of active pulmonary disease. Electronically Signed   By: Burman Nieves M.D.   On: 03/23/2023 22:37   DG Chest 2 View Result Date: 03/23/2023 CLINICAL DATA:  Chest pain. EXAM: CHEST - 2 VIEW COMPARISON:  October 14, 2022. FINDINGS: The heart size and mediastinal contours are within normal limits. Both lungs are clear. The visualized skeletal structures are unremarkable. IMPRESSION: No active cardiopulmonary disease. Electronically Signed   By: Lupita Raider M.D.   On: 03/23/2023 15:39    Procedures Procedures    Medications Ordered in ED Medications  ketorolac (TORADOL) 30 MG/ML injection 15 mg (15 mg Intravenous Given 03/23/23 1704)  iohexol (OMNIPAQUE) 350 MG/ML injection 100 mL (75 mLs Intravenous Contrast Given 03/23/23 2208)  methylPREDNISolone sodium succinate (SOLU-MEDROL) 40 mg/mL injection 40 mg (40 mg Intravenous Given 03/23/23 1817)  diphenhydrAMINE (BENADRYL) capsule 50 mg ( Oral See Alternative 03/23/23 2115)    Or  diphenhydrAMINE (BENADRYL) injection 50 mg (50 mg Intravenous Given 03/23/23  2115)    ED Course/ Medical Decision Making/ A&P             HEART Score: 3                    Medical Decision Making Amount and/or Complexity of Data Reviewed Labs: ordered. Radiology: ordered.  Risk Prescription drug management.   This patient presents to the ED with chief complaint(s) of chest pain, with pleurisy which is constant with pertinent past medical history of hypertension, hyperlipidemia, diabetes and previous use of  hormone replacement.The complaint involves an extensive differential diagnosis and also carries with it a high risk of complications and morbidity.  The differential diagnosis considered for this patient includes  ACS syndrome Aortic dissection CHF exacerbation Valvular disorder Myocarditis Pericarditis Endocarditis Pericardial effusion / tamponade Pneumonia Pleural effusion / Pulmonary edema PE Pneumothorax Musculoskeletal pain PUD / Gastritis / Esophagitis Esophageal spasm  The initial plan is to : Get basic labs, including delta troponin. Patient's hear score is 3  Discussed with the patient that our suspicion for ACS is low.  Discussed with her that PE is also less likely, but given the pleuritic nature to her pain, dizziness and previous history of hormone replacement it is in the consideration.  We discussed wait and watch approach versus aggressive option today we will get D-dimer and rule out PE if needed.  Patient prefers the aggressive option.  Additional history obtained: Records reviewed Primary Care Documents  Independent labs interpretation:  The following labs were independently interpreted: Initial troponin is reassuring.  D-dimer is elevated.  Independent visualization and interpretation of imaging: - I independently visualized the following imaging with scope of interpretation limited to determining acute life threatening conditions related to emergency care: X-ray of the chest, which revealed no evidence of pneumonia,  pneumothorax  Treatment and Reassessment: D-dimer is elevated.  Patient is allergic to contrast, therefore we will initiate prep for the contrast.  10:42 PM  Patient's CT scan is negative for PE. Results discussed with her.  The patient appears reasonably screened and/or stabilized for discharge and I doubt any other medical condition or other [facility] requiring further screening, evaluation, or treatment in the ED at this time prior to discharge.   Results from the ER workup discussed with the patient face to face and all questions answered to the best of my ability. The patient is safe for discharge with strict return precautions.     Final Clinical Impression(s) / ED Diagnoses Final diagnoses:  Acute chest wall pain    Rx / DC Orders ED Discharge Orders     None         Derwood Kaplan, MD 03/23/23 2242

## 2023-03-23 NOTE — ED Notes (Signed)
Pt transported to CT via wheelchair.

## 2023-03-23 NOTE — Discharge Instructions (Signed)
The CT scan is negative for blood clots in the lung. We suspect that most likely you have chest wall pain.  We recommend that you take anti-inflammatory medications over-the-counter for pain control every 4-6 hours.  Follow-up with your PCP in 1 week.

## 2023-08-26 ENCOUNTER — Encounter (HOSPITAL_BASED_OUTPATIENT_CLINIC_OR_DEPARTMENT_OTHER): Payer: Self-pay

## 2023-08-26 ENCOUNTER — Emergency Department (HOSPITAL_BASED_OUTPATIENT_CLINIC_OR_DEPARTMENT_OTHER)
Admission: EM | Admit: 2023-08-26 | Discharge: 2023-08-26 | Disposition: A | Discharge status: Home / Self Care | Attending: Emergency Medicine | Admitting: Emergency Medicine

## 2023-08-26 ENCOUNTER — Emergency Department (HOSPITAL_BASED_OUTPATIENT_CLINIC_OR_DEPARTMENT_OTHER): Admitting: Radiology

## 2023-08-26 ENCOUNTER — Other Ambulatory Visit: Payer: Self-pay

## 2023-08-26 DIAGNOSIS — M5412 Radiculopathy, cervical region: Secondary | ICD-10-CM | POA: Diagnosis not present

## 2023-08-26 DIAGNOSIS — M79602 Pain in left arm: Secondary | ICD-10-CM | POA: Diagnosis not present

## 2023-08-26 DIAGNOSIS — Z7982 Long term (current) use of aspirin: Secondary | ICD-10-CM | POA: Diagnosis not present

## 2023-08-26 DIAGNOSIS — R079 Chest pain, unspecified: Secondary | ICD-10-CM | POA: Insufficient documentation

## 2023-08-26 DIAGNOSIS — M542 Cervicalgia: Secondary | ICD-10-CM | POA: Diagnosis present

## 2023-08-26 LAB — CBC
HCT: 44.6 % (ref 36.0–46.0)
Hemoglobin: 14.2 g/dL (ref 12.0–15.0)
MCH: 30.5 pg (ref 26.0–34.0)
MCHC: 31.8 g/dL (ref 30.0–36.0)
MCV: 95.9 fL (ref 80.0–100.0)
Platelets: 157 10*3/uL (ref 150–400)
RBC: 4.65 MIL/uL (ref 3.87–5.11)
RDW: 12.5 % (ref 11.5–15.5)
WBC: 5.3 10*3/uL (ref 4.0–10.5)
nRBC: 0 % (ref 0.0–0.2)

## 2023-08-26 LAB — BASIC METABOLIC PANEL WITH GFR
Anion gap: 12 (ref 5–15)
BUN: 9 mg/dL (ref 6–20)
CO2: 25 mmol/L (ref 22–32)
Calcium: 10 mg/dL (ref 8.9–10.3)
Chloride: 103 mmol/L (ref 98–111)
Creatinine, Ser: 0.76 mg/dL (ref 0.44–1.00)
GFR, Estimated: >60 mL/min (ref >60)
Glucose, Bld: 89 mg/dL (ref 70–99)
Potassium: 3.8 mmol/L (ref 3.5–5.1)
Sodium: 140 mmol/L (ref 135–145)

## 2023-08-26 LAB — TROPONIN T, HIGH SENSITIVITY
Troponin T High Sensitivity: <15 ng/L (ref <19)
Troponin T High Sensitivity: <15 ng/L (ref <19)

## 2023-08-26 MED ORDER — OXYCODONE-ACETAMINOPHEN 5-325 MG PO TABS
1.0000 | ORAL_TABLET | Freq: Once | ORAL | Status: AC
  Administered 2023-08-26: 1 via ORAL
  Filled 2023-08-26: qty 1

## 2023-08-26 MED ORDER — METHOCARBAMOL 500 MG PO TABS
500.0000 mg | ORAL_TABLET | Freq: Three times a day (TID) | ORAL | 0 refills | Status: AC | PRN
Start: 2023-08-26 — End: ?

## 2023-08-26 MED ORDER — HYDROCODONE-ACETAMINOPHEN 5-325 MG PO TABS: 1.0000 | ORAL_TABLET | ORAL | 0 refills | Status: DC | PRN

## 2023-08-26 MED ORDER — IBUPROFEN 600 MG PO TABS: 600.0000 mg | ORAL_TABLET | Freq: Four times a day (QID) | ORAL | 0 refills | Status: AC | PRN

## 2023-08-26 MED ORDER — IBUPROFEN 800 MG PO TABS
800.0000 mg | ORAL_TABLET | Freq: Once | ORAL | Status: AC
  Administered 2023-08-26: 800 mg via ORAL
  Filled 2023-08-26: qty 1

## 2023-08-26 NOTE — ED Provider Notes (Signed)
 [place] EMERGENCY DEPARTMENT AT [facility] Provider Note   CSN: [id number] Arrival date & time: 08/26/23  1930     History  Chief Complaint  Patient presents with   Chest Pain    [name] [name] is a 58 y.o. female.  Patient presents to the emergency department for evaluation of left-sided chest and arm pain.  Patient reports that symptoms began earlier today.  She has a pain in the left upper chest and left side of her neck with pain and tingling going down her arm.  This seems to be positional, worsens with turning her head.  She does get some relief with certain positions of her arm.  No known injury.  No weakness of the upper or lower extremities.       Home Medications Prior to Admission medications   Medication Sig Start Date End Date Taking? Authorizing Provider  HYDROcodone -acetaminophen  (NORCO/VICODIN) 5-325 MG tablet Take 1 tablet by mouth every 4 (four) hours as needed for moderate pain (pain score 4-6). 08/26/23  Yes Iyad Deroo, Marine Sia, MD  ibuprofen  (ADVIL ) 600 MG tablet Take 1 tablet (600 mg total) by mouth every 6 (six) hours as needed. 08/26/23  Yes Magdelena Kinsella, Marine Sia, MD  methocarbamol  (ROBAXIN ) 500 MG tablet Take 1 tablet (500 mg total) by mouth every 8 (eight) hours as needed for muscle spasms. 08/26/23  Yes Griffin Gerrard, Marine Sia, MD  albuterol  (PROVENTIL  HFA;VENTOLIN  HFA) 108 (90 BASE) MCG/ACT inhaler Inhale 2 puffs into the lungs every 6 (six) hours as needed for wheezing or shortness of breath.    [provider]  albuterol  (PROVENTIL ) (2.5 MG/3ML) 0.083% nebulizer solution Take 2.5 mg by nebulization every 6 (six) hours as needed for wheezing or shortness of breath.    [provider]  albuterol  (PROVENTIL ) (2.5 MG/3ML) 0.083% nebulizer solution Take 3 mLs (2.5 mg total) by nebulization every 6 (six) hours as needed for wheezing or shortness of breath. 03/19/22   Wynetta Heckle, MD  albuterol  (VENTOLIN  HFA) 108  (90 Base) MCG/ACT inhaler Inhale 1-2 puffs into the lungs every 6 (six) hours as needed for wheezing or shortness of breath. 03/19/22   Wynetta Heckle, MD  ALPRAZolam  (XANAX ) 0.5 MG tablet Take 0.5 mg by mouth at bedtime as needed for sleep or anxiety.    [provider]  aspirin 81 MG tablet Take 81 mg by mouth daily.    [provider]  B-D ULTRAFINE III SHORT PEN 31G X 8 MM MISC See admin instructions. 09/19/14   [provider]  BREO ELLIPTA 200-25 MCG/INH AEPB Inhale 1 puff into the lungs daily. 03/07/20   [provider]  budesonide-formoterol (SYMBICORT) 160-4.5 MCG/ACT inhaler Inhale 2 puffs into the lungs 2 (two) times daily.    [provider]  cyclobenzaprine  (FLEXERIL ) 10 MG tablet Take 1 tablet (10 mg total) by mouth 2 (two) times daily as needed for muscle spasms. 01/04/23   Mesner, Reymundo Caulk, MD  DULoxetine (CYMBALTA) 60 MG capsule Take 60 mg by mouth daily.    [provider]  ENBREL MINI 50 MG/ML SOCT Inject into the skin. 05/09/20   [provider]  EPINEPHrine  (EPIPEN  2-PAK) 0.3 mg/0.3 mL IJ SOAJ injection as needed. 10/25/12   [provider]  esomeprazole  (NEXIUM ) 40 MG capsule Take 1 capsule (40 mg total) by mouth 2 (two) times daily before a meal. 02/11/15   Turner, Rufus Council, MD  estradiol (CLIMARA - DOSED IN MG/24 HR) 0.1 mg/24hr patch Place 0.1 mg onto  the skin once a week.    [provider]  folic acid (FOLVITE) 1 MG tablet Take 1 mg by mouth daily.  07/24/14   [provider]  insulin  degludec (TRESIBA FLEXTOUCH) 100 UNIT/ML SOPN FlexTouch Pen Inject 29-30 Units into the skin daily at 10 pm.    [provider]  insulin  glargine (LANTUS ) 100 UNIT/ML injection Inject 30 Units into the skin at bedtime.    [provider]  ipratropium (ATROVENT ) 0.02 % nebulizer solution Take 2.5 mLs (0.5 mg total) by nebulization 4 (four) times daily. Patient taking differently: Take 0.5 mg by  nebulization every 6 (six) hours as needed for wheezing or shortness of breath.  04/12/14   Wynetta Heckle, MD  JARDIANCE 25 MG TABS tablet Take 25 mg by mouth daily. 03/20/20   [provider]  levocetirizine (XYZAL) 5 MG tablet Take 5 mg by mouth every evening. 09/08/14   [provider]  losartan (COZAAR) 50 MG tablet Take 50 mg by mouth daily.  07/24/14   [provider]  losartan-hydrochlorothiazide (HYZAAR) 100-12.5 MG tablet Take 1 tablet by mouth daily. 05/24/20   [provider]  meclizine (ANTIVERT) 25 MG tablet Take 25 mg by mouth 2 (two) times daily as needed for dizziness.    [provider]  meloxicam  (MOBIC ) 15 MG tablet Take 1 tablet (15 mg total) by mouth daily. 01/04/23   Mesner, Reymundo Caulk, MD  metFORMIN  (GLUCOPHAGE -XR) 500 MG 24 hr tablet Take 1,000 mg by mouth every evening.  03/11/15   [provider]  methotrexate (RHEUMATREX) 2.5 MG tablet Take 10 mg by mouth once a week. On Wednesday. Caution:Chemotherapy. Protect from light.    [provider]  montelukast (SINGULAIR) 10 MG tablet Take 10 mg by mouth at bedtime.    [provider]  predniSONE  (DELTASONE ) 20 MG tablet 2 tabs po daily x 4 days 03/19/22   Wynetta Heckle, MD  topiramate (TOPAMAX) 25 MG tablet Take 75 mg by mouth at bedtime.    [provider]  Vitamin D, Ergocalciferol, (DRISDOL) 50000 UNITS CAPS Take 50,000 Units by mouth 3 (three) times a week. Mondays, wednesdays, fridays    [provider]      Allergies    Iodine and Shellfish allergy    Review of Systems   Review of Systems  Physical Exam Updated Vital Signs BP 132/80 (BP Location: Left Arm)   Pulse 60   Temp (!) 97.3 F (36.3 C)   Resp 16   SpO2 99%  Physical Exam Vitals and nursing note reviewed.  Constitutional:      General: She is not in acute distress.    Appearance: She is well-developed.  HENT:     Head: Normocephalic and atraumatic.      Mouth/Throat:     Mouth: Mucous membranes are moist.  Eyes:     General: Vision grossly intact. Gaze aligned appropriately.     Extraocular Movements: Extraocular movements intact.     Conjunctiva/sclera: Conjunctivae normal.  Cardiovascular:     Rate and Rhythm: Normal rate and regular rhythm.     Pulses: Normal pulses.     Heart sounds: Normal heart sounds, S1 normal and S2 normal. No murmur heard.    No friction rub. No gallop.  Pulmonary:     Effort: Pulmonary effort is normal. No respiratory distress.     Breath sounds: Normal breath sounds.  Abdominal:     General: Bowel sounds are normal.  Palpations: Abdomen is soft.     Tenderness: There is no abdominal tenderness. There is no guarding or rebound.     Hernia: No hernia is present.  Musculoskeletal:        General: No swelling.     Cervical back: Normal range of motion and neck supple. Pain with movement and muscular tenderness (Left side) present. No spinous process tenderness. Normal range of motion.     Right lower leg: No edema.     Left lower leg: No edema.  Skin:    General: Skin is warm and dry.     Capillary Refill: Capillary refill takes less than 2 seconds.     Findings: No ecchymosis, erythema, rash or wound.  Neurological:     General: No focal deficit present.     Mental Status: She is alert and oriented to person, place, and time.     GCS: GCS eye subscore is 4. GCS verbal subscore is 5. GCS motor subscore is 6.     Cranial Nerves: Cranial nerves 2-12 are intact.     Sensory: Sensation is intact.     Motor: Motor function is intact.     Coordination: Coordination is intact.  Psychiatric:        Attention and Perception: Attention normal.        Mood and Affect: Mood normal.        Speech: Speech normal.        Behavior: Behavior normal.     ED Results / Procedures / Treatments   Labs (all labs ordered are listed, but only abnormal results are displayed) Labs Reviewed  BASIC METABOLIC PANEL WITH  GFR  CBC  TROPONIN T, HIGH SENSITIVITY  TROPONIN T, HIGH SENSITIVITY    EKG EKG Interpretation Date/Time:  Thursday Aug 26 2023 19:39:02 EDT Ventricular Rate:  56 PR Interval:  154 QRS Duration:  76 QT Interval:  420 QTC Calculation: 405 R Axis:   34  Text Interpretation: Sinus bradycardia Possible Anterior infarct (cited on or before 23-Mar-2023) Abnormal ECG When compared with ECG of 23-Mar-2023 15:21, No significant change was found Confirmed by Ballard Bongo [phone]) on 08/26/2023 11:13:16 PM  Radiology DG Chest 2 View Result Date: 08/26/2023 CLINICAL DATA:  cp EXAM: CHEST - 2 VIEW COMPARISON:  03/23/2023. FINDINGS: Cardiac silhouette is unremarkable. No pneumothorax or pleural effusion. The lungs are clear. There are thoracic degenerative changes. IMPRESSION: No acute cardiopulmonary process. Electronically Signed   By: Sydell Eva M.D.   On: 08/26/2023 20:48    Procedures Procedures    Medications Ordered in ED Medications - No data to display  ED Course/ Medical Decision Making/ A&P                                 Medical Decision Making Amount and/or Complexity of Data Reviewed Labs: ordered. Radiology: ordered.   Differential Diagnosis considered includes, but not limited to: STEMI; NSTEMI; myocarditis; pericarditis; pulmonary embolism; aortic dissection; pneumothorax; pneumonia; gastritis; musculoskeletal pain  Presents to the emergency department for evaluation of chest pain.  Discussion with the patient and examination reveals that she is experiencing more left-sided neck pain with radiation to the left arm and upper chest.  Pain is reproducible and worsened with positioning and movement.  Examination does not reveal any sensory deficit, no weakness of the upper extremities.  She does have some painful inhibition of movement of the left arm, also reports  that certain positions feel better than others.  Patient's EKG is unchanged from prior.   Troponin negative x 2.  Presentation is consistent with a radiculopathy, not cardiac disease.  Patient declined steroids, is a diabetic.  Will treat with rest, NSAIDs, follow-up with PCP.        Final Clinical Impression(s) / ED Diagnoses Final diagnoses:  Cervical radiculopathy    Rx / DC Orders ED Discharge Orders          Ordered    HYDROcodone -acetaminophen  (NORCO/VICODIN) 5-325 MG tablet  Every 4 hours PRN        08/26/23 2325    ibuprofen  (ADVIL ) 600 MG tablet  Every 6 hours PRN        08/26/23 2325    methocarbamol  (ROBAXIN ) 500 MG tablet  Every 8 hours PRN        08/26/23 2325              Ballard Bongo, MD 08/26/23 2325

## 2023-08-26 NOTE — ED Triage Notes (Signed)
 Pt c/o chest pain going into L arm, tingling in L hand. Pain onset today, unsure of time "but I tried to wait it out." Denies NV, SHOB. Denies cardiac/ resp hx, endorses hx HTN

## 2023-09-09 ENCOUNTER — Other Ambulatory Visit: Payer: Self-pay | Admitting: Family Medicine

## 2023-09-09 DIAGNOSIS — M5412 Radiculopathy, cervical region: Secondary | ICD-10-CM

## 2023-09-15 ENCOUNTER — Ambulatory Visit
Admission: RE | Admit: 2023-09-15 | Discharge: 2023-09-15 | Disposition: A | Discharge status: Home / Self Care | Source: Ambulatory Visit | Attending: Family Medicine | Admitting: Family Medicine

## 2023-09-15 DIAGNOSIS — M5412 Radiculopathy, cervical region: Secondary | ICD-10-CM

## 2023-10-28 ENCOUNTER — Other Ambulatory Visit: Payer: Self-pay | Admitting: Neurosurgery

## 2023-11-18 ENCOUNTER — Other Ambulatory Visit (HOSPITAL_COMMUNITY)

## 2023-11-18 NOTE — Pre-Procedure Instructions (Signed)
 Surgical Instructions   Your procedure is scheduled on November 22, 2023. Report to [facility] Main Entrance A at 1:20 P.M., then check in with the Admitting office. Any questions or running late day of surgery: call [phone]  Questions prior to your surgery date: call [phone], Monday-Friday, 8am-4pm. If you experience any cold or flu symptoms such as cough, fever, chills, shortness of breath, etc. between now and your scheduled surgery, please notify us  at the above number.     Remember:  Do not eat after midnight the night before your surgery  You may drink clear liquids until 12:20 PM the afternoon of your surgery.   Clear liquids allowed are: Water , Non-Citrus Juices (without pulp), Carbonated Beverages, Clear Tea (no milk, honey, etc.), Black Coffee Only (NO MILK, CREAM OR POWDERED CREAMER of any kind), and Gatorade.    Take these medicines the morning of surgery with A SIP OF WATER : BREO ELLIPTA Inhaler budesonide-formoterol (SYMBICORT) Inhaler DULoxetine (CYMBALTA)  esomeprazole  (NEXIUM )  predniSONE  (DELTASONE )    May take these medicines IF NEEDED: albuterol  (PROVENTIL  HFA;VENTOLIN  HFA) inhaler - please bring inhaler with you morning of surgery albuterol  (PROVENTIL ) nebulizer solution  ALPRAZolam  (XANAX )  cyclobenzaprine  (FLEXERIL )  EPINEPHrine  Pen HYDROcodone -acetaminophen  (NORCO/VICODIN)  ipratropium (ATROVENT ) nebulizer solution  meclizine (ANTIVERT)  methocarbamol  (ROBAXIN )    Please follow prescribing physicians instructions on if/when to stop taking your ENBREL injections and methotrexate (RHEUMATREX).   Follow your surgeon's instructions on when to stop Aspirin.  If no instructions were given by your surgeon then you will need to call the office to get those instructions.     One week prior to surgery, STOP taking any Aleve, Naproxen, Ibuprofen , Motrin , Advil , Goody's, BC's, all herbal medications, fish oil, and non-prescription vitamins. This  includes your medication: meloxicam  (MOBIC )    WHAT DO I DO ABOUT MY DIABETES MEDICATION?   Do not take metFORMIN  (GLUCOPHAGE -XR) the morning of surgery.  THE NIGHT BEFORE SURGERY, take 15 units of insulin  degludec (TRESIBA FLEXTOUCH).      STOP taking your JARDIANCE three days prior to surgery. Your last dose will be August 21st.  STOP taking your OZEMPIC one week prior to surgery. DO NOT take any doses after August 17th.   HOW TO MANAGE YOUR DIABETES BEFORE AND AFTER SURGERY  Why is it important to control my blood sugar before and after surgery? Improving blood sugar levels before and after surgery helps healing and can limit problems. A way of improving blood sugar control is eating a healthy diet by:  Eating less sugar and carbohydrates  Increasing activity/exercise  Talking with your doctor about reaching your blood sugar goals High blood sugars (greater than 180 mg/dL) can raise your risk of infections and slow your recovery, so you will need to focus on controlling your diabetes during the weeks before surgery. Make sure that the doctor who takes care of your diabetes knows about your planned surgery including the date and location.  How do I manage my blood sugar before surgery? Check your blood sugar at least 4 times a day, starting 2 days before surgery, to make sure that the level is not too high or low.  Check your blood sugar the morning of your surgery when you wake up and every 2 hours until you get to the Short Stay unit.  If your blood sugar is less than 70 mg/dL, you will need to treat for low blood sugar: Do not take insulin . Treat a low blood sugar (less than 70 mg/dL)  with  cup of clear juice (cranberry or apple), 4 glucose tablets, OR glucose gel. Recheck blood sugar in 15 minutes after treatment (to make sure it is greater than 70 mg/dL). If your blood sugar is not greater than 70 mg/dL on recheck, call [phone] for further instructions. Report your  blood sugar to the short stay nurse when you get to Short Stay.  If you are admitted to the hospital after surgery: Your blood sugar will be checked by the staff and you will probably be given insulin  after surgery (instead of oral diabetes medicines) to make sure you have good blood sugar levels. The goal for blood sugar control after surgery is 80-180 mg/dL.                      Do NOT Smoke (Tobacco/Vaping) for 24 hours prior to your procedure.  If you use a CPAP at night, you may bring your mask/headgear for your overnight stay.   You will be asked to remove any contacts, glasses, piercing's, hearing aid's, dentures/partials prior to surgery. Please bring cases for these items if needed.    Patients discharged the day of surgery will not be allowed to drive home, and someone needs to stay with them for 24 hours.  SURGICAL WAITING ROOM VISITATION Patients may have no more than 2 support people in the waiting area - these visitors may rotate.   Pre-op nurse will coordinate an appropriate time for 1 ADULT support person, who may not rotate, to accompany patient in pre-op.  Children under the age of 12 must have an adult with them who is not the patient and must remain in the main waiting area with an adult.  If the patient needs to stay at the hospital during part of their recovery, the visitor guidelines for inpatient rooms apply.  Please refer to the [facility] website for the visitor guidelines for any additional information.   If you received a COVID test during your pre-op visit  it is requested that you wear a mask when out in public, stay away from anyone that may not be feeling well and notify your surgeon if you develop symptoms. If you have been in contact with anyone that has tested positive in the last 10 days please notify you surgeon.      Pre-operative 5 CHG Bathing Instructions   You can play a key role in reducing the risk of infection after surgery. Your skin  needs to be as free of germs as possible. You can reduce the number of germs on your skin by washing with CHG (chlorhexidine gluconate) soap before surgery. CHG is an antiseptic soap that kills germs and continues to kill germs even after washing.   DO NOT use if you have an allergy to chlorhexidine/CHG or antibacterial soaps. If your skin becomes reddened or irritated, stop using the CHG and notify one of our RNs at [phone].   Please shower with the CHG soap starting 4 days before surgery using the following schedule:     Please keep in mind the following:  DO NOT shave, including legs and underarms, starting the day of your first shower.   You may shave your face at any point before/day of surgery.  Place clean sheets on your bed the day you start using CHG soap. Use a clean washcloth (not used since being washed) for each shower. DO NOT sleep with pets once you start using the CHG.   CHG Shower Instructions:  Wash your face and private area with normal soap. If you choose to wash your hair, wash first with your normal shampoo.  After you use shampoo/soap, rinse your hair and body thoroughly to remove shampoo/soap residue.  Turn the water  OFF and apply about 3 tablespoons (45 ml) of CHG soap to a CLEAN washcloth.  Apply CHG soap ONLY FROM YOUR NECK DOWN TO YOUR TOES (washing for 3-5 minutes)  DO NOT use CHG soap on face, private areas, open wounds, or sores.  Pay special attention to the area where your surgery is being performed.  If you are having back surgery, having someone wash your back for you may be helpful. Wait 2 minutes after CHG soap is applied, then you may rinse off the CHG soap.  Pat dry with a clean towel  Put on clean clothes/pajamas   If you choose to wear lotion, please use ONLY the CHG-compatible lotions that are listed below.  Additional instructions for the day of surgery: DO NOT APPLY any lotions, deodorants, cologne, or perfumes.   Do not bring valuables  to the hospital. [facility] is not responsible for any belongings/valuables. Do not wear nail polish, gel polish, artificial nails, or any other type of covering on natural nails (fingers and toes) Do not wear jewelry or makeup Put on clean/comfortable clothes.  Please brush your teeth.  Ask your nurse before applying any prescription medications to the skin.     CHG Compatible Lotions   Aveeno Moisturizing lotion  Cetaphil Moisturizing Cream  Cetaphil Moisturizing Lotion  Clairol Herbal Essence Moisturizing Lotion, Dry Skin  Clairol Herbal Essence Moisturizing Lotion, Extra Dry Skin  Clairol Herbal Essence Moisturizing Lotion, Normal Skin  Curel Age Defying Therapeutic Moisturizing Lotion with Alpha Hydroxy  Curel Extreme Care Body Lotion  Curel Soothing Hands Moisturizing Hand Lotion  Curel Therapeutic Moisturizing Cream, Fragrance-Free  Curel Therapeutic Moisturizing Lotion, Fragrance-Free  Curel Therapeutic Moisturizing Lotion, Original Formula  Eucerin Daily Replenishing Lotion  Eucerin Dry Skin Therapy Plus Alpha Hydroxy Crme  Eucerin Dry Skin Therapy Plus Alpha Hydroxy Lotion  Eucerin Original Crme  Eucerin Original Lotion  Eucerin Plus Crme Eucerin Plus Lotion  Eucerin TriLipid Replenishing Lotion  Keri Anti-Bacterial Hand Lotion  Keri Deep Conditioning Original Lotion Dry Skin Formula Softly Scented  Keri Deep Conditioning Original Lotion, Fragrance Free Sensitive Skin Formula  Keri Lotion Fast Absorbing Fragrance Free Sensitive Skin Formula  Keri Lotion Fast Absorbing Softly Scented Dry Skin Formula  Keri Original Lotion  Keri Skin Renewal Lotion Keri Silky Smooth Lotion  Keri Silky Smooth Sensitive Skin Lotion  Nivea Body Creamy Conditioning Oil  Nivea Body Extra Enriched Lotion  Nivea Body Original Lotion  Nivea Body Sheer Moisturizing Lotion Nivea Crme  Nivea Skin Firming Lotion  NutraDerm 30 Skin Lotion  NutraDerm Skin Lotion  NutraDerm Therapeutic  Skin Cream  NutraDerm Therapeutic Skin Lotion  ProShield Protective Hand Cream  Provon moisturizing lotion  Please read over the following fact sheets that you were given.

## 2023-11-19 ENCOUNTER — Encounter (HOSPITAL_COMMUNITY): Payer: Self-pay

## 2023-11-19 ENCOUNTER — Encounter (HOSPITAL_COMMUNITY)
Admission: RE | Admit: 2023-11-19 | Discharge: 2023-11-19 | Disposition: A | Discharge status: Home / Self Care | Source: Ambulatory Visit | Attending: Neurosurgery | Admitting: Neurosurgery

## 2023-11-19 ENCOUNTER — Other Ambulatory Visit: Payer: Self-pay

## 2023-11-19 ENCOUNTER — Other Ambulatory Visit (HOSPITAL_COMMUNITY)

## 2023-11-19 VITALS — BP 109/62 | HR 53 | Temp 97.8°F | Resp 17 | Ht 64.0 in | Wt 276.5 lb

## 2023-11-19 DIAGNOSIS — K219 Gastro-esophageal reflux disease without esophagitis: Secondary | ICD-10-CM | POA: Diagnosis not present

## 2023-11-19 DIAGNOSIS — G43009 Migraine without aura, not intractable, without status migrainosus: Secondary | ICD-10-CM | POA: Insufficient documentation

## 2023-11-19 DIAGNOSIS — Z79899 Other long term (current) drug therapy: Secondary | ICD-10-CM | POA: Insufficient documentation

## 2023-11-19 DIAGNOSIS — R001 Bradycardia, unspecified: Secondary | ICD-10-CM | POA: Diagnosis not present

## 2023-11-19 DIAGNOSIS — J45909 Unspecified asthma, uncomplicated: Secondary | ICD-10-CM | POA: Insufficient documentation

## 2023-11-19 DIAGNOSIS — Z7985 Long-term (current) use of injectable non-insulin antidiabetic drugs: Secondary | ICD-10-CM | POA: Diagnosis not present

## 2023-11-19 DIAGNOSIS — F419 Anxiety disorder, unspecified: Secondary | ICD-10-CM | POA: Diagnosis not present

## 2023-11-19 DIAGNOSIS — M069 Rheumatoid arthritis, unspecified: Secondary | ICD-10-CM | POA: Diagnosis not present

## 2023-11-19 DIAGNOSIS — Z9889 Other specified postprocedural states: Secondary | ICD-10-CM | POA: Diagnosis not present

## 2023-11-19 DIAGNOSIS — M5412 Radiculopathy, cervical region: Secondary | ICD-10-CM | POA: Insufficient documentation

## 2023-11-19 DIAGNOSIS — E119 Type 2 diabetes mellitus without complications: Secondary | ICD-10-CM | POA: Diagnosis not present

## 2023-11-19 DIAGNOSIS — G4733 Obstructive sleep apnea (adult) (pediatric): Secondary | ICD-10-CM | POA: Diagnosis not present

## 2023-11-19 DIAGNOSIS — Z7984 Long term (current) use of oral hypoglycemic drugs: Secondary | ICD-10-CM | POA: Insufficient documentation

## 2023-11-19 DIAGNOSIS — D649 Anemia, unspecified: Secondary | ICD-10-CM | POA: Insufficient documentation

## 2023-11-19 DIAGNOSIS — I10 Essential (primary) hypertension: Secondary | ICD-10-CM | POA: Diagnosis not present

## 2023-11-19 DIAGNOSIS — E785 Hyperlipidemia, unspecified: Secondary | ICD-10-CM | POA: Diagnosis not present

## 2023-11-19 DIAGNOSIS — Z01818 Encounter for other preprocedural examination: Secondary | ICD-10-CM

## 2023-11-19 DIAGNOSIS — Z01812 Encounter for preprocedural laboratory examination: Secondary | ICD-10-CM | POA: Diagnosis not present

## 2023-11-19 LAB — COMPREHENSIVE METABOLIC PANEL WITH GFR
ALT: 32 U/L (ref 0–44)
AST: 48 U/L — ABNORMAL HIGH (ref 15–41)
Albumin: 3.5 g/dL (ref 3.5–5.0)
Alkaline Phosphatase: 144 U/L — ABNORMAL HIGH (ref 38–126)
Anion gap: 8 (ref 5–15)
BUN: 7 mg/dL (ref 6–20)
CO2: 26 mmol/L (ref 22–32)
Calcium: 9.3 mg/dL (ref 8.9–10.3)
Chloride: 106 mmol/L (ref 98–111)
Creatinine, Ser: 0.78 mg/dL (ref 0.44–1.00)
GFR, Estimated: >60 mL/min (ref >60)
Glucose, Bld: 106 mg/dL — ABNORMAL HIGH (ref 70–99)
Potassium: 4.2 mmol/L (ref 3.5–5.1)
Sodium: 140 mmol/L (ref 135–145)
Total Bilirubin: 1.1 mg/dL (ref 0.0–1.2)
Total Protein: 7.7 g/dL (ref 6.5–8.1)

## 2023-11-19 LAB — CBC
HCT: 42.8 % (ref 36.0–46.0)
Hemoglobin: 13.7 g/dL (ref 12.0–15.0)
MCH: 31.5 pg (ref 26.0–34.0)
MCHC: 32 g/dL (ref 30.0–36.0)
MCV: 98.4 fL (ref 80.0–100.0)
Platelets: 150 K/uL (ref 150–400)
RBC: 4.35 MIL/uL (ref 3.87–5.11)
RDW: 12.8 % (ref 11.5–15.5)
WBC: 4.3 K/uL (ref 4.0–10.5)
nRBC: 0 % (ref 0.0–0.2)

## 2023-11-19 LAB — SURGICAL PCR SCREEN
MRSA, PCR: NEGATIVE
Staphylococcus aureus: NEGATIVE

## 2023-11-19 LAB — HEMOGLOBIN A1C
Hgb A1c MFr Bld: 6.6 % — ABNORMAL HIGH (ref 4.8–5.6)
Mean Plasma Glucose: 142.72 mg/dL

## 2023-11-19 LAB — GLUCOSE, CAPILLARY: Glucose-Capillary: 103 mg/dL — ABNORMAL HIGH (ref 70–99)

## 2023-11-19 NOTE — Progress Notes (Signed)
 Anesthesia Chart Review:   Case: [id number] Date/Time: 11/22/23 1512   Procedure: ANTERIOR CERVICAL DECOMPRESSION/DISCECTOMY FUSION 1 LEVEL - ACDF - C5-C6   Anesthesia type: General   Diagnosis: Cervical radiculopathy [M54.12]   Pre-op diagnosis: Cervical radiculopathy   [facility] ROOM 21 / [facility] OR   Surgeons: Louis Shove, MD       DISCUSSION: Patient is a 58 year old female scheduled for the above procedure.  History includes never smoker, post-operative N/V, asthma, HTN, HLD, bradycardia, DM2, GERD, OSA, anemia, anxiety, migraines, RA, hysterectomy. 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  ED visit 08/26/23 reviewed. Symptoms felt consistent with cervical radiculopathy.   A1c 6.6%. Last Jardiance 11/18/2023. Last Ozempic 11/14/23. Also on Tresiba.   Anesthesia team to evaluate on the day of surgery.    VS: BP 109/62   Pulse (!) 53   Temp 36.6 C   Resp 17   Ht 5' 4 (1.626 m)   Wt 125.4 kg   SpO2 99%   BMI 47.46 kg/m    PROVIDERS: Koirala, Dibas, MD is PCP  - Leni Hard, MD is rheumatologist - She is not followed routinely by cardiology but had an evaluation by Shlomo Corning, MD last ~ 2016-2017 for palpitations, chest pain (non-ischemic ETT and Myoveiw), asymptomatic bradycardia, OSA   LABS: Labs reviewed: Acceptable for surgery. (all labs ordered are listed, but only abnormal results are displayed)  Labs Reviewed  GLUCOSE, CAPILLARY - Abnormal; Notable for the following components:      Result Value   Glucose-Capillary 103 (*)    All other components within normal limits  HEMOGLOBIN A1C - Abnormal; Notable for the following components:   Hgb A1c MFr Bld 6.6 (*)    All other components within normal limits  COMPREHENSIVE METABOLIC PANEL WITH GFR - Abnormal; Notable for the following components:   Glucose, Bld 106 (*)    AST 48 (*)    Alkaline Phosphatase 144 (*)     All other components within normal limits  SURGICAL PCR SCREEN  CBC     IMAGES: MRI C-spine 09/15/2023: IMPRESSION: 1. Left paracentral disc protrusion at C5-6 with resultant moderate spinal stenosis and mild cord flattening, but no cord signal changes. 2. Disc bulge with uncovertebral spurring at C3-4 with resultant moderate right C4 foraminal stenosis. 3. Small left paracentral disc protrusion at C6-7 without significant stenosis. 4. 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 No syrinx.   CXR 08/26/2023: FINDINGS: Cardiac silhouette is unremarkable. No pneumothorax or pleural effusion. The lungs are clear. There are thoracic degenerative changes. IMPRESSION: No acute cardiopulmonary process.  CTA Chest 03/23/2023: IMPRESSION: 1. No evidence of significant central pulmonary embolus. 2. No evidence of active pulmonary disease.    EKG: 85/29/2025: Sinus bradycardia at 56 bpm Possible Anterior infarct (cited on or before 23-Mar-2023) Abnormal ECG When compared with ECG of 23-Mar-2023 15:21, No significant change was found Confirmed by Haze Lonni PARAS [phone]) on 08/26/2023 11:13:16 PM   CV: Cardiac event monitor 12/13/2015 - 01/11/2016: Sinus Bradycardia as low as 40bpm and sinus tachycardia as high as 103bpm. Average heart rate iw 63bpm. Occasional PACs  Nuclear stress test 02/05/2015: Nuclear stress EF: 61%. There was no ST segment deviation noted during stress. This is a low risk study. No ischemia. Good overall exercise effort - 9:30  Echo 02/04/2015: Impressions:  - Normal LV  size with mild LV hypertrophy. EF 55-60%. Normal    diastolic function. Normal RV size and systolic function. No    significant valvular abnormalities.   Past Medical History:  Diagnosis Date   Anemia    history   Anxiety    Arthritis    lower back, knee   Asthma    Bradycardia 09/26/2014   Depression    Diabetes  mellitus without complication (HCC)    Esophageal reflux    GERD (gastroesophageal reflux disease)    H/O gestational diabetes mellitus, not currently pregnant    Headache(784.0)    on rx med   Hyperlipidemia    diet controlled   Hypertension    Mental disorder    Migraines    Obesity    OSA (obstructive sleep apnea)    Mild w AHI 14/hr now on CPAP at 9cm H2O   PONV (postoperative nausea and vomiting)    hard to wake to wake up per patient   Seasonal allergies    Sleep apnea    cpap setting 70   SVD (spontaneous vaginal delivery)    x 2    Past Surgical History:  Procedure Laterality Date   ABDOMINAL HYSTERECTOMY     BREAST BIOPSY Right 2015   carpel tunnel surgery     x 2 - left and right   CESAREAN SECTION  1987   x 1   CYSTOSCOPY N/A 08/08/2012   Procedure: CYSTOSCOPY;  Surgeon: Dickie DELENA Carder, MD;  Location: [facility] ORS;  Service: Gynecology;  Laterality: N/A;   left foot surgery     remove cyst   ROBOTIC ASSISTED LAPAROSCOPIC LYSIS OF ADHESION N/A 08/08/2012   Procedure: ROBOTIC ASSISTED LAPAROSCOPIC exctensive LYSIS OF ADHESION;  Surgeon: Dickie DELENA Carder, MD;  Location: [facility] ORS;  Service: Gynecology;  Laterality: N/A;   ROBOTIC ASSISTED SALPINGO OOPHERECTOMY Left 08/08/2012   Procedure: ROBOTIC ASSISTED SALPINGO OOPHORECTOMY;  Surgeon: Dickie DELENA Carder, MD;  Location: [facility] ORS;  Service: Gynecology;  Laterality: Left;   trigger thumb release     x 2 for right and left thumbs   WISDOM TOOTH EXTRACTION      MEDICATIONS:  Semaglutide,0.25 or 0.5MG /DOS, (OZEMPIC, 0.25 OR 0.5 MG/DOSE,) 2 MG/3ML SOPN   albuterol  (PROVENTIL ) (2.5 MG/3ML) 0.083% nebulizer solution   albuterol  (VENTOLIN  HFA) 108 (90 Base) MCG/ACT inhaler   atorvastatin (LIPITOR) 20 MG tablet   B-D ULTRAFINE III SHORT PEN 31G X 8 MM MISC   Cholecalciferol (VITAMIN D3) 50 MCG (2000 UT) capsule   cyclobenzaprine  (FLEXERIL ) 10 MG tablet   EPINEPHrine  (EPIPEN  2-PAK) 0.3 mg/0.3 mL IJ SOAJ injection    Ferrous Sulfate (IRON) 28 MG TABS   folic acid (FOLVITE) 1 MG tablet   ibuprofen  (ADVIL ) 600 MG tablet   insulin  degludec (TRESIBA FLEXTOUCH) 100 UNIT/ML SOPN FlexTouch Pen   ipratropium (ATROVENT ) 0.02 % nebulizer solution   JARDIANCE 25 MG TABS tablet   losartan (COZAAR) 25 MG tablet   magnesium  gluconate (MAGONATE) 500 (27 Mg) MG TABS tablet   meclizine (ANTIVERT) 25 MG tablet   meloxicam  (MOBIC ) 15 MG tablet   methocarbamol  (ROBAXIN ) 500 MG tablet   montelukast (SINGULAIR) 10 MG tablet   Multiple Vitamins-Minerals (MULTIVITAMIN WITH MINERALS) tablet   No current facility-administered medications for this encounter.    Isaiah Ruder, PA-C Surgical Short Stay/Anesthesiology [facility] Phone [phone] [facility] Phone [phone] 11/19/2023 5:48 PM

## 2023-11-19 NOTE — Progress Notes (Addendum)
 PCP - Dibas MD Koirala  Cardiologist - Dr. Wilbert Bihari (LOV 12-10-15 per patient is was as needed)  PPM/ICD - denies Device Orders - n/a Rep Notified - n/a  Chest x-ray - 08-26-23 EKG - 08-26-23 Stress Test - 02-05-15 ECHO - 02-04-15 Cardiac Cath - denies  Sleep Study - 05-12-19 CPAP - yes  Fasting Blood Sugar - MD monitors A1c. Patient does not monitor blood sugar at home regularly. Blood sugar at PAT appointment 103  JARDIANCE - Last dose 11-18-23 TRESIBA FLEXTOUCH - Take 15 units morning of surgery  Last dose of GLP1 agonist-  Ozempic GLP1 instructions: Last dose 11-14-23  Blood Thinner Instructions:denies Aspirin Instructions:n/a  ERAS Protcol -clear liquids until 1:20 pm   COVID TEST- n/a   Anesthesia review:no  Patient denies shortness of breath, fever, cough and chest pain at PAT appointment   All instructions explained to the patient, with a verbal understanding of the material. Patient agrees to go over the instructions while at home for a better understanding. Patient also instructed to self quarantine after being tested for COVID-19. The opportunity to ask questions was provided.

## 2023-11-19 NOTE — Anesthesia Preprocedure Evaluation (Addendum)
 Anesthesia Evaluation  Patient identified by MRN, date of birth, ID band Patient awake    Reviewed: Allergy & Precautions, H&P , NPO status , Patient's Chart, lab work & pertinent test results  History of Anesthesia Complications (+) PONV and history of anesthetic complications  Airway Mallampati: II   Neck ROM: full    Dental   Pulmonary asthma , sleep apnea    breath sounds clear to auscultation       Cardiovascular hypertension,  Rhythm:regular Rate:Normal     Neuro/Psych  Headaches PSYCHIATRIC DISORDERS Anxiety Depression       GI/Hepatic ,GERD  ,,  Endo/Other  diabetes, Type 2  Class 4 obesity  Renal/GU      Musculoskeletal  (+) Arthritis ,    Abdominal   Peds  Hematology   Anesthesia Other Findings   Reproductive/Obstetrics                              Anesthesia Physical Anesthesia Plan  ASA: 3  Anesthesia Plan: General   Post-op Pain Management:    Induction: Intravenous  PONV Risk Score and Plan: 4 or greater and Ondansetron , Dexamethasone , Midazolam  and Treatment may vary due to age or medical condition  Airway Management Planned: Oral ETT and Video Laryngoscope Planned  Additional Equipment:   Intra-op Plan:   Post-operative Plan: Extubation in OR  Informed Consent: I have reviewed the patients History and Physical, chart, labs and discussed the procedure including the risks, benefits and alternatives for the proposed anesthesia with the patient or authorized representative who has indicated his/her understanding and acceptance.     Dental advisory given  Plan Discussed with: CRNA, Anesthesiologist and Surgeon  Anesthesia Plan Comments: (PAT note written 11/19/2023 by Allison Zelenak, PA-C.  )         Anesthesia Quick Evaluation

## 2023-11-22 ENCOUNTER — Encounter (HOSPITAL_COMMUNITY): Payer: Self-pay | Admitting: Neurosurgery

## 2023-11-22 ENCOUNTER — Other Ambulatory Visit: Payer: Self-pay

## 2023-11-22 ENCOUNTER — Ambulatory Visit (HOSPITAL_COMMUNITY): Admitting: Anesthesiology

## 2023-11-22 ENCOUNTER — Ambulatory Visit (HOSPITAL_COMMUNITY): Payer: Self-pay | Admitting: Vascular Surgery

## 2023-11-22 ENCOUNTER — Ambulatory Visit (HOSPITAL_COMMUNITY)
Admission: RE | Disposition: A | Discharge status: Home / Self Care | Payer: Self-pay | Source: Home / Self Care | Attending: Neurosurgery

## 2023-11-22 ENCOUNTER — Observation Stay (HOSPITAL_COMMUNITY)
Admission: RE | Admit: 2023-11-22 | Discharge: 2023-11-23 | Disposition: A | Discharge status: Home / Self Care | Attending: Neurosurgery | Admitting: Neurosurgery

## 2023-11-22 ENCOUNTER — Ambulatory Visit (HOSPITAL_COMMUNITY)

## 2023-11-22 DIAGNOSIS — M5412 Radiculopathy, cervical region: Secondary | ICD-10-CM | POA: Diagnosis not present

## 2023-11-22 DIAGNOSIS — J45909 Unspecified asthma, uncomplicated: Secondary | ICD-10-CM | POA: Diagnosis not present

## 2023-11-22 DIAGNOSIS — I10 Essential (primary) hypertension: Secondary | ICD-10-CM | POA: Diagnosis not present

## 2023-11-22 DIAGNOSIS — M4722 Other spondylosis with radiculopathy, cervical region: Secondary | ICD-10-CM | POA: Diagnosis not present

## 2023-11-22 DIAGNOSIS — F418 Other specified anxiety disorders: Secondary | ICD-10-CM | POA: Diagnosis not present

## 2023-11-22 DIAGNOSIS — E119 Type 2 diabetes mellitus without complications: Secondary | ICD-10-CM | POA: Insufficient documentation

## 2023-11-22 DIAGNOSIS — M4802 Spinal stenosis, cervical region: Secondary | ICD-10-CM | POA: Diagnosis not present

## 2023-11-22 DIAGNOSIS — M50122 Cervical disc disorder at C5-C6 level with radiculopathy: Secondary | ICD-10-CM | POA: Diagnosis present

## 2023-11-22 DIAGNOSIS — Z794 Long term (current) use of insulin: Secondary | ICD-10-CM | POA: Diagnosis not present

## 2023-11-22 HISTORY — PX: ANTERIOR CERVICAL DECOMP/DISCECTOMY FUSION: SHX1161

## 2023-11-22 LAB — GLUCOSE, CAPILLARY
Glucose-Capillary: 91 mg/dL (ref 70–99)
Glucose-Capillary: 119 mg/dL — ABNORMAL HIGH (ref 70–99)
Glucose-Capillary: 64 mg/dL — ABNORMAL LOW (ref 70–99)
Glucose-Capillary: 105 mg/dL — ABNORMAL HIGH (ref 70–99)
Glucose-Capillary: 155 mg/dL — ABNORMAL HIGH (ref 70–99)

## 2023-11-22 SURGERY — ANTERIOR CERVICAL DECOMPRESSION/DISCECTOMY FUSION 1 LEVEL
Anesthesia: General | Site: Neck

## 2023-11-22 MED ORDER — ADULT MULTIVITAMIN W/MINERALS CH
1.0000 | ORAL_TABLET | Freq: Every day | ORAL | Status: DC
  Administered 2023-11-22: 1 via ORAL
  Filled 2023-11-22: qty 1

## 2023-11-22 MED ORDER — ONDANSETRON HCL 4 MG/2ML IJ SOLN: 4.0000 mg | Freq: Four times a day (QID) | INTRAMUSCULAR | Status: DC | PRN

## 2023-11-22 MED ORDER — SUCCINYLCHOLINE CHLORIDE 200 MG/10ML IV SOSY
PREFILLED_SYRINGE | INTRAVENOUS | Status: AC
  Filled 2023-11-22: qty 20

## 2023-11-22 MED ORDER — HYDROCODONE-ACETAMINOPHEN 5-325 MG PO TABS
1.0000 | ORAL_TABLET | ORAL | Status: DC | PRN
  Administered 2023-11-22: 1 via ORAL
  Filled 2023-11-22: qty 1

## 2023-11-22 MED ORDER — ROCURONIUM BROMIDE 10 MG/ML (PF) SYRINGE
PREFILLED_SYRINGE | INTRAVENOUS | Status: DC | PRN
  Administered 2023-11-22: 10 mg via INTRAVENOUS
  Administered 2023-11-22: 60 mg via INTRAVENOUS
  Administered 2023-11-22: 40 mg via INTRAVENOUS

## 2023-11-22 MED ORDER — EPHEDRINE SULFATE-NACL 50-0.9 MG/10ML-% IV SOSY
PREFILLED_SYRINGE | INTRAVENOUS | Status: DC | PRN
  Administered 2023-11-22 (×2): 10 mg via INTRAVENOUS
  Administered 2023-11-22: 15 mg via INTRAVENOUS
  Administered 2023-11-22: 5 mg via INTRAVENOUS

## 2023-11-22 MED ORDER — CEFAZOLIN SODIUM-DEXTROSE 2-4 GM/100ML-% IV SOLN
INTRAVENOUS | Status: AC
  Filled 2023-11-22: qty 100

## 2023-11-22 MED ORDER — MAGNESIUM GLUCONATE 500 (27 MG) MG PO TABS
500.0000 mg | ORAL_TABLET | Freq: Every day | ORAL | Status: DC
  Administered 2023-11-23: 500 mg via ORAL
  Filled 2023-11-22: qty 1

## 2023-11-22 MED ORDER — DEXAMETHASONE SODIUM PHOSPHATE 10 MG/ML IJ SOLN
INTRAMUSCULAR | Status: AC
  Filled 2023-11-22: qty 2

## 2023-11-22 MED ORDER — 0.9 % SODIUM CHLORIDE (POUR BTL) OPTIME
TOPICAL | Status: DC | PRN
  Administered 2023-11-22: 1000 mL

## 2023-11-22 MED ORDER — MENTHOL 3 MG MT LOZG
1.0000 | LOZENGE | OROMUCOSAL | Status: DC | PRN
  Administered 2023-11-22: 3 mg via ORAL
  Filled 2023-11-22: qty 9

## 2023-11-22 MED ORDER — FENTANYL CITRATE (PF) 250 MCG/5ML IJ SOLN
INTRAMUSCULAR | Status: DC | PRN
  Administered 2023-11-22: 100 ug via INTRAVENOUS
  Administered 2023-11-22: 50 ug via INTRAVENOUS
  Administered 2023-11-22: 100 ug via INTRAVENOUS

## 2023-11-22 MED ORDER — DEXTROSE 50 % IV SOLN
25.0000 mL | Freq: Once | INTRAVENOUS | Status: AC
  Filled 2023-11-22: qty 50

## 2023-11-22 MED ORDER — ORAL CARE MOUTH RINSE: 15.0000 mL | Freq: Once | OROMUCOSAL | Status: AC

## 2023-11-22 MED ORDER — SUGAMMADEX SODIUM 200 MG/2ML IV SOLN
INTRAVENOUS | Status: DC | PRN
  Administered 2023-11-22: 400 mg via INTRAVENOUS
  Administered 2023-11-22: 200 mg via INTRAVENOUS

## 2023-11-22 MED ORDER — PHENYLEPHRINE 80 MCG/ML (10ML) SYRINGE FOR IV PUSH (FOR BLOOD PRESSURE SUPPORT)
PREFILLED_SYRINGE | INTRAVENOUS | Status: DC | PRN
  Administered 2023-11-22: 80 ug via INTRAVENOUS

## 2023-11-22 MED ORDER — SODIUM CHLORIDE 0.9 % IV SOLN
0.1500 ug/kg/min | INTRAVENOUS | Status: DC
  Administered 2023-11-22: .05 ug/kg/min via INTRAVENOUS
  Filled 2023-11-22 (×2): qty 2000

## 2023-11-22 MED ORDER — ONDANSETRON HCL 4 MG/2ML IJ SOLN
4.0000 mg | Freq: Four times a day (QID) | INTRAMUSCULAR | Status: DC | PRN
  Administered 2023-11-22: 4 mg via INTRAVENOUS
  Filled 2023-11-22: qty 2

## 2023-11-22 MED ORDER — FENTANYL CITRATE (PF) 250 MCG/5ML IJ SOLN
INTRAMUSCULAR | Status: AC
  Filled 2023-11-22: qty 5

## 2023-11-22 MED ORDER — MIDAZOLAM HCL 2 MG/2ML IJ SOLN
INTRAMUSCULAR | Status: AC
  Filled 2023-11-22: qty 2

## 2023-11-22 MED ORDER — PHENOL 1.4 % MT LIQD: 1.0000 | OROMUCOSAL | Status: DC | PRN

## 2023-11-22 MED ORDER — OXYCODONE HCL 5 MG/5ML PO SOLN: 5.0000 mg | Freq: Once | ORAL | Status: DC | PRN

## 2023-11-22 MED ORDER — MIDAZOLAM HCL 2 MG/2ML IJ SOLN
INTRAMUSCULAR | Status: DC | PRN
  Administered 2023-11-22: 2 mg via INTRAVENOUS

## 2023-11-22 MED ORDER — FERROUS SULFATE 325 (65 FE) MG PO TABS
325.0000 mg | ORAL_TABLET | Freq: Every day | ORAL | Status: DC
  Administered 2023-11-23: 325 mg via ORAL
  Filled 2023-11-22: qty 1

## 2023-11-22 MED ORDER — ACETAMINOPHEN 325 MG PO TABS: 650.0000 mg | ORAL_TABLET | ORAL | Status: DC | PRN

## 2023-11-22 MED ORDER — ONDANSETRON HCL 4 MG/2ML IJ SOLN
INTRAMUSCULAR | Status: AC
  Filled 2023-11-22: qty 4

## 2023-11-22 MED ORDER — OXYCODONE HCL 5 MG PO TABS: 5.0000 mg | ORAL_TABLET | Freq: Once | ORAL | Status: DC | PRN

## 2023-11-22 MED ORDER — SODIUM CHLORIDE 0.9 % IV SOLN: INTRAVENOUS | Status: DC | PRN

## 2023-11-22 MED ORDER — ATORVASTATIN CALCIUM 10 MG PO TABS
20.0000 mg | ORAL_TABLET | Freq: Every day | ORAL | Status: DC
  Administered 2023-11-22: 20 mg via ORAL
  Filled 2023-11-22: qty 2

## 2023-11-22 MED ORDER — ALBUTEROL SULFATE (2.5 MG/3ML) 0.083% IN NEBU: 2.5000 mg | INHALATION_SOLUTION | Freq: Four times a day (QID) | RESPIRATORY_TRACT | Status: DC | PRN

## 2023-11-22 MED ORDER — PROPOFOL 10 MG/ML IV BOLUS
INTRAVENOUS | Status: DC | PRN
  Administered 2023-11-22: 200 mg via INTRAVENOUS

## 2023-11-22 MED ORDER — MONTELUKAST SODIUM 10 MG PO TABS
10.0000 mg | ORAL_TABLET | Freq: Every day | ORAL | Status: DC
  Administered 2023-11-22: 10 mg via ORAL
  Filled 2023-11-22: qty 1

## 2023-11-22 MED ORDER — EMPAGLIFLOZIN 25 MG PO TABS
25.0000 mg | ORAL_TABLET | Freq: Every day | ORAL | Status: DC
  Administered 2023-11-23: 25 mg via ORAL
  Filled 2023-11-22: qty 1

## 2023-11-22 MED ORDER — VITAMIN D3 25 MCG (1000 UNIT) PO TABS
2000.0000 [IU] | ORAL_TABLET | Freq: Every day | ORAL | Status: DC
  Administered 2023-11-23: 2000 [IU] via ORAL
  Filled 2023-11-22: qty 2

## 2023-11-22 MED ORDER — METHOCARBAMOL 1000 MG/10ML IJ SOLN
INTRAMUSCULAR | Status: AC
  Filled 2023-11-22: qty 10

## 2023-11-22 MED ORDER — LACTATED RINGERS IV SOLN: INTRAVENOUS | Status: DC

## 2023-11-22 MED ORDER — CHLORHEXIDINE GLUCONATE 0.12 % MT SOLN
OROMUCOSAL | Status: AC
  Administered 2023-11-22: 15 mL via OROMUCOSAL
  Filled 2023-11-22: qty 15

## 2023-11-22 MED ORDER — ONDANSETRON HCL 4 MG/2ML IJ SOLN
INTRAMUSCULAR | Status: DC | PRN
  Administered 2023-11-22: 4 mg via INTRAVENOUS

## 2023-11-22 MED ORDER — DEXAMETHASONE SODIUM PHOSPHATE 10 MG/ML IJ SOLN
INTRAMUSCULAR | Status: DC | PRN
  Administered 2023-11-22: 10 mg via INTRAVENOUS

## 2023-11-22 MED ORDER — ALBUTEROL SULFATE HFA 108 (90 BASE) MCG/ACT IN AERS: 1.0000 | INHALATION_SPRAY | Freq: Four times a day (QID) | RESPIRATORY_TRACT | Status: DC | PRN

## 2023-11-22 MED ORDER — FENTANYL CITRATE (PF) 100 MCG/2ML IJ SOLN
25.0000 ug | INTRAMUSCULAR | Status: DC | PRN
  Administered 2023-11-22 (×3): 50 ug via INTRAVENOUS

## 2023-11-22 MED ORDER — HYDROMORPHONE HCL 1 MG/ML IJ SOLN: 1.0000 mg | INTRAMUSCULAR | Status: DC | PRN

## 2023-11-22 MED ORDER — FOLIC ACID 1 MG PO TABS: 1.0000 mg | ORAL_TABLET | Freq: Every day | ORAL | Status: DC

## 2023-11-22 MED ORDER — INSULIN GLARGINE 100 UNIT/ML ~~LOC~~ SOLN
29.0000 [IU] | Freq: Every day | SUBCUTANEOUS | Status: DC
  Administered 2023-11-22: 29 [IU] via SUBCUTANEOUS
  Filled 2023-11-22 (×2): qty 0.29

## 2023-11-22 MED ORDER — INSULIN ASPART 100 UNIT/ML IJ SOLN
0.0000 [IU] | Freq: Three times a day (TID) | INTRAMUSCULAR | Status: DC
  Administered 2023-11-23: 3 [IU] via SUBCUTANEOUS

## 2023-11-22 MED ORDER — FENTANYL CITRATE (PF) 100 MCG/2ML IJ SOLN
25.0000 ug | INTRAMUSCULAR | Status: DC | PRN
  Administered 2023-11-22 (×2): 50 ug via INTRAVENOUS

## 2023-11-22 MED ORDER — ACETAMINOPHEN 650 MG RE SUPP: 650.0000 mg | RECTAL | Status: DC | PRN

## 2023-11-22 MED ORDER — SODIUM CHLORIDE 0.9% FLUSH
3.0000 mL | Freq: Two times a day (BID) | INTRAVENOUS | Status: DC
  Administered 2023-11-22: 3 mL via INTRAVENOUS

## 2023-11-22 MED ORDER — FENTANYL CITRATE (PF) 100 MCG/2ML IJ SOLN
INTRAMUSCULAR | Status: AC
  Filled 2023-11-22: qty 2

## 2023-11-22 MED ORDER — DEXTROSE 50 % IV SOLN
INTRAVENOUS | Status: AC
  Administered 2023-11-22: 25 mL via INTRAVENOUS
  Filled 2023-11-22: qty 50

## 2023-11-22 MED ORDER — FENTANYL CITRATE (PF) 100 MCG/2ML IJ SOLN
INTRAMUSCULAR | Status: AC
Start: 2023-11-22 — End: 2023-11-22
  Filled 2023-11-22: qty 2

## 2023-11-22 MED ORDER — CEFAZOLIN SODIUM-DEXTROSE 3-4 GM/150ML-% IV SOLN
3.0000 g | INTRAVENOUS | Status: AC
  Administered 2023-11-22: 2 g via INTRAVENOUS

## 2023-11-22 MED ORDER — METHOCARBAMOL 1000 MG/10ML IJ SOLN
500.0000 mg | Freq: Once | INTRAMUSCULAR | Status: AC
  Administered 2023-11-22: 500 mg via INTRAVENOUS

## 2023-11-22 MED ORDER — CHLORHEXIDINE GLUCONATE CLOTH 2 % EX PADS: 6.0000 | MEDICATED_PAD | Freq: Once | CUTANEOUS | Status: DC

## 2023-11-22 MED ORDER — CEFAZOLIN SODIUM-DEXTROSE 1-4 GM/50ML-% IV SOLN
1.0000 g | Freq: Three times a day (TID) | INTRAVENOUS | Status: AC
  Administered 2023-11-22 – 2023-11-23 (×2): 1 g via INTRAVENOUS
  Filled 2023-11-22 (×2): qty 50

## 2023-11-22 MED ORDER — LIDOCAINE 2% (20 MG/ML) 5 ML SYRINGE
INTRAMUSCULAR | Status: DC | PRN
  Administered 2023-11-22: 80 mg via INTRAVENOUS

## 2023-11-22 MED ORDER — HYDROCODONE-ACETAMINOPHEN 10-325 MG PO TABS
2.0000 | ORAL_TABLET | ORAL | Status: DC | PRN
  Administered 2023-11-23 (×2): 2 via ORAL
  Filled 2023-11-22 (×3): qty 2

## 2023-11-22 MED ORDER — THROMBIN (RECOMBINANT) 5000 UNITS EX SOLR: CUTANEOUS | Status: DC | PRN

## 2023-11-22 MED ORDER — INSULIN ASPART 100 UNIT/ML IJ SOLN: 0.0000 [IU] | INTRAMUSCULAR | Status: DC | PRN

## 2023-11-22 MED ORDER — CHLORHEXIDINE GLUCONATE 0.12 % MT SOLN: 15.0000 mL | Freq: Once | OROMUCOSAL | Status: AC

## 2023-11-22 MED ORDER — EPHEDRINE 5 MG/ML INJ
INTRAVENOUS | Status: AC
  Filled 2023-11-22: qty 15

## 2023-11-22 MED ORDER — SODIUM CHLORIDE 0.9% FLUSH
3.0000 mL | INTRAVENOUS | Status: DC | PRN
Start: 2023-11-22 — End: 2023-11-23

## 2023-11-22 MED ORDER — THROMBIN 5000 UNITS EX KIT
PACK | CUTANEOUS | Status: AC
  Filled 2023-11-22: qty 2

## 2023-11-22 MED ORDER — PROPOFOL 500 MG/50ML IV EMUL
INTRAVENOUS | Status: DC | PRN
  Administered 2023-11-22: 125 ug/kg/min via INTRAVENOUS

## 2023-11-22 MED ORDER — SODIUM CHLORIDE 0.9 % IV SOLN: 250.0000 mL | INTRAVENOUS | Status: DC

## 2023-11-22 MED ORDER — ONDANSETRON HCL 4 MG PO TABS
4.0000 mg | ORAL_TABLET | Freq: Four times a day (QID) | ORAL | Status: DC | PRN
  Administered 2023-11-23: 4 mg via ORAL
  Filled 2023-11-22: qty 1

## 2023-11-22 MED ORDER — LOSARTAN POTASSIUM 25 MG PO TABS
25.0000 mg | ORAL_TABLET | Freq: Every day | ORAL | Status: DC
  Administered 2023-11-22: 25 mg via ORAL
  Filled 2023-11-22: qty 1

## 2023-11-22 MED ORDER — IPRATROPIUM BROMIDE 0.02 % IN SOLN: 0.5000 mg | Freq: Four times a day (QID) | RESPIRATORY_TRACT | Status: DC | PRN

## 2023-11-22 MED ORDER — CYCLOBENZAPRINE HCL 10 MG PO TABS
10.0000 mg | ORAL_TABLET | Freq: Three times a day (TID) | ORAL | Status: DC | PRN
  Administered 2023-11-22: 10 mg via ORAL
  Filled 2023-11-22 (×2): qty 1

## 2023-11-22 MED ORDER — INSULIN DEGLUDEC 100 UNIT/ML ~~LOC~~ SOPN: 29.0000 [IU] | PEN_INJECTOR | Freq: Every day | SUBCUTANEOUS | Status: DC

## 2023-11-22 SURGICAL SUPPLY — 43 items
BAG COUNTER SPONGE SURGICOUNT (BAG) ×1 IMPLANT
BAND RUBBER #18 3X1/16 STRL (MISCELLANEOUS) ×2 IMPLANT
BAND RUBBER 3X1/6 STRL (MISCELLANEOUS) IMPLANT
BENZOIN TINCTURE PRP APPL 2/3 (GAUZE/BANDAGES/DRESSINGS) ×1 IMPLANT
BIT DRILL 13 (BIT) IMPLANT
BUR MATCHSTICK NEURO 3.0 LAGG (BURR) ×1 IMPLANT
CAGE PEEK 8X14X11 (Cage) IMPLANT
CANISTER SUCTION 3000ML PPV (SUCTIONS) ×1 IMPLANT
DRAPE C-ARM 42X72 X-RAY (DRAPES) ×2 IMPLANT
DRAPE LAPAROTOMY 100X72 PEDS (DRAPES) ×1 IMPLANT
DRAPE MICROSCOPE SLANT 54X150 (MISCELLANEOUS) ×1 IMPLANT
DURAPREP 6ML APPLICATOR 50/CS (WOUND CARE) ×1 IMPLANT
ELECT COATED BLADE 2.86 ST (ELECTRODE) ×1 IMPLANT
ELECTRODE REM PT RTRN 9FT ADLT (ELECTROSURGICAL) ×1 IMPLANT
GAUZE 4X4 16PLY ~~LOC~~+RFID DBL (SPONGE) IMPLANT
GAUZE SPONGE 4X4 12PLY STRL (GAUZE/BANDAGES/DRESSINGS) ×1 IMPLANT
GLOVE ECLIPSE 9.0 STRL (GLOVE) ×1 IMPLANT
GLOVE EXAM NITRILE XL STR (GLOVE) IMPLANT
GOWN STRL REUS W/ TWL LRG LVL3 (GOWN DISPOSABLE) IMPLANT
GOWN STRL REUS W/ TWL XL LVL3 (GOWN DISPOSABLE) IMPLANT
GOWN STRL REUS W/TWL 2XL LVL3 (GOWN DISPOSABLE) IMPLANT
HALTER HD/CHIN CERV TRACTION D (MISCELLANEOUS) ×1 IMPLANT
HEMOSTAT POWDER KIT SURGIFOAM (HEMOSTASIS) IMPLANT
KIT BASIN OR (CUSTOM PROCEDURE TRAY) ×1 IMPLANT
KIT TURNOVER KIT B (KITS) ×1 IMPLANT
NDL SPNL 20GX3.5 QUINCKE YW (NEEDLE) ×1 IMPLANT
NEEDLE SPNL 20GX3.5 QUINCKE YW (NEEDLE) ×1 IMPLANT
NS IRRIG 1000ML POUR BTL (IV SOLUTION) ×1 IMPLANT
PACK LAMINECTOMY NEURO (CUSTOM PROCEDURE TRAY) ×1 IMPLANT
PAD ARMBOARD POSITIONER FOAM (MISCELLANEOUS) ×3 IMPLANT
PENCIL BUTTON HOLSTER BLD 10FT (ELECTRODE) IMPLANT
PLATE ELITE VISION 25MM (Plate) IMPLANT
SCREW ST 13X4XST VA NS SPNE (Screw) IMPLANT
SPONGE INTESTINAL PEANUT (DISPOSABLE) ×1 IMPLANT
SPONGE SURGIFOAM ABS GEL SZ50 (HEMOSTASIS) ×1 IMPLANT
STRIP CLOSURE SKIN 1/2X4 (GAUZE/BANDAGES/DRESSINGS) ×1 IMPLANT
SUT VIC AB 3-0 SH 8-18 (SUTURE) ×1 IMPLANT
SUT VIC AB 4-0 RB1 18 (SUTURE) ×1 IMPLANT
TAPE CLOTH 4X10 WHT NS (GAUZE/BANDAGES/DRESSINGS) ×1 IMPLANT
TOWEL GREEN STERILE (TOWEL DISPOSABLE) ×1 IMPLANT
TOWEL GREEN STERILE FF (TOWEL DISPOSABLE) ×1 IMPLANT
TRAP SPECIMEN MUCUS 40CC (MISCELLANEOUS) ×1 IMPLANT
WATER STERILE IRR 1000ML POUR (IV SOLUTION) ×1 IMPLANT

## 2023-11-22 NOTE — H&P (Signed)
 [name] [name] is an 58 y.o. female.   Chief Complaint: Left arm pain HPI: 58 year old female with chronic and progressively worsening neck pain with radiation into both upper extremities with progressive numbness and weakness in her left upper extremity.  Workup demonstrates evidence of multilevel cervical degeneration worst at C5-6 where she has spondylitic protrusion and associated disc bulge causing significant spinal stenosis with left-sided C5-6 spinal cord compression and narrowing of her left C6 neural foramen.  Patient has failed conservative management presents now for C5-6 anterior cervical discectomy and fusion in hopes improving her symptoms.  Past Medical History:  Diagnosis Date   Anemia    history   Anxiety    Arthritis    lower back, knee   Asthma    Bradycardia 09/26/2014   Depression    Diabetes mellitus without complication (HCC)    Esophageal reflux    GERD (gastroesophageal reflux disease)    H/O gestational diabetes mellitus, not currently pregnant    Headache(784.0)    on rx med   Hyperlipidemia    diet controlled   Hypertension    Mental disorder    Migraines    Obesity    OSA (obstructive sleep apnea)    Mild w AHI 14/hr now on CPAP at 9cm H2O   PONV (postoperative nausea and vomiting)    hard to wake to wake up per patient   Seasonal allergies    Sleep apnea    cpap setting 70   SVD (spontaneous vaginal delivery)    x 2    Past Surgical History:  Procedure Laterality Date   ABDOMINAL HYSTERECTOMY     BREAST BIOPSY Right 2015   carpel tunnel surgery     x 2 - left and right   CESAREAN SECTION  1987   x 1   CYSTOSCOPY N/A 08/08/2012   Procedure: CYSTOSCOPY;  Surgeon: Dickie DELENA Carder, MD;  Location: [facility];  Service: Gynecology;  Laterality: N/A;   left foot surgery     remove cyst   ROBOTIC ASSISTED LAPAROSCOPIC LYSIS OF ADHESION N/A 08/08/2012   Procedure: ROBOTIC ASSISTED LAPAROSCOPIC exctensive LYSIS OF ADHESION;   Surgeon: Dickie DELENA Carder, MD;  Location: [facility];  Service: Gynecology;  Laterality: N/A;   ROBOTIC ASSISTED SALPINGO OOPHERECTOMY Left 08/08/2012   Procedure: ROBOTIC ASSISTED SALPINGO OOPHORECTOMY;  Surgeon: Dickie DELENA Carder, MD;  Location: [facility];  Service: Gynecology;  Laterality: Left;   trigger thumb release     x 2 for right and left thumbs   WISDOM TOOTH EXTRACTION      Family History  Problem Relation Age of Onset   Diabetes Mellitus II Mother    Hypertension Father    Diabetes Father    HIV Brother    Diabetes Maternal Grandmother    CAD Maternal Grandmother    CAD Maternal Grandfather    Colon cancer Maternal Uncle    Colon cancer Paternal Uncle    Breast cancer Neg Hx    Social History:  reports that she has never smoked. She has never used smokeless tobacco. She reports that she does not drink alcohol and does not use drugs.  Allergies:  Allergies  Allergen Reactions   Iodine Anaphylaxis    Seafood allergy   Shellfish Allergy Swelling   Hydromorphone      Other Reaction(s): Not available   Lisinopril     Other Reaction(s): Cough, Not available   Wound Dressing Adhesive Dermatitis    Medications Prior to Admission  Medication  Sig Dispense Refill   albuterol  (PROVENTIL ) (2.5 MG/3ML) 0.083% nebulizer solution Take 3 mLs (2.5 mg total) by nebulization every 6 (six) hours as needed for wheezing or shortness of breath. 75 mL 0   albuterol  (VENTOLIN  HFA) 108 (90 Base) MCG/ACT inhaler Inhale 1-2 puffs into the lungs every 6 (six) hours as needed for wheezing or shortness of breath. 1 each 1   atorvastatin  (LIPITOR) 20 MG tablet Take 20 mg by mouth daily.     Cholecalciferol  (VITAMIN D3) 50 MCG (2000 UT) capsule Take 2,000 Units by mouth daily.     cyclobenzaprine  (FLEXERIL ) 10 MG tablet Take 1 tablet (10 mg total) by mouth 2 (two) times daily as needed for muscle spasms. 20 tablet 0   EPINEPHrine  (EPIPEN  2-PAK) 0.3 mg/0.3 mL IJ SOAJ injection as needed.      Ferrous Sulfate  (IRON) 28 MG TABS Take 28 mg by mouth daily.     folic acid  (FOLVITE ) 1 MG tablet Take 1 mg by mouth daily.      ibuprofen  (ADVIL ) 600 MG tablet Take 1 tablet (600 mg total) by mouth every 6 (six) hours as needed. 30 tablet 0   insulin  degludec (TRESIBA  FLEXTOUCH) 100 UNIT/ML SOPN FlexTouch Pen Inject 29-30 Units into the skin daily at 10 pm.     ipratropium (ATROVENT ) 0.02 % nebulizer solution Take 2.5 mLs (0.5 mg total) by nebulization 4 (four) times daily. (Patient taking differently: Take 0.5 mg by nebulization every 6 (six) hours as needed for wheezing or shortness of breath.) 75 mL 12   JARDIANCE  25 MG TABS tablet Take 25 mg by mouth daily.     losartan  (COZAAR ) 25 MG tablet Take 25 mg by mouth daily.     magnesium  gluconate (MAGONATE) 500 (27 Mg) MG TABS tablet Take 500 mg by mouth daily.     meclizine (ANTIVERT) 25 MG tablet Take 25 mg by mouth 2 (two) times daily as needed for dizziness.     meloxicam  (MOBIC ) 15 MG tablet Take 1 tablet (15 mg total) by mouth daily. 10 tablet 0   methocarbamol  (ROBAXIN ) 500 MG tablet Take 1 tablet (500 mg total) by mouth every 8 (eight) hours as needed for muscle spasms. 20 tablet 0   montelukast  (SINGULAIR ) 10 MG tablet Take 10 mg by mouth at bedtime.     Multiple Vitamins-Minerals (MULTIVITAMIN WITH MINERALS) tablet Take 1 tablet by mouth daily.     B-D ULTRAFINE III SHORT PEN 31G X 8 MM MISC See admin instructions.  0   Semaglutide,0.25 or 0.5MG /DOS, (OZEMPIC, 0.25 OR 0.5 MG/DOSE,) 2 MG/3ML SOPN Inject 0.5 mg into the skin once a week. Takes on saturday      Results for orders placed or performed during the hospital encounter of 11/22/23 (from the past 48 hours)  Glucose, capillary     Status: None   Collection Time: 11/22/23 12:32 PM  Result Value Ref Range   Glucose-Capillary 91 70 - 99 mg/dL    Comment: Glucose reference range applies only to samples taken after fasting for at least 8 hours.   No results found.  Pertinent items  noted in HPI and remainder of comprehensive ROS otherwise negative.  Blood pressure 122/72, pulse 61, temperature 98.3 F (36.8 C), temperature source Oral, resp. rate 18, height 5' 4 (1.626 m), weight 124.3 kg, SpO2 96%.  Patient is awake and alert.  She is oriented and appropriate.  Speech is fluent.  Judgment insight are intact.  Cranial nerve function normal bilateral.  Motor examination reveals some weakness of her left sided wrist extensor and brachial radialis muscle groups.  Sensory examination with decrease sensation pinprick and light touch in her left C6 dermatome.  Deep tendon reflexes normal active.  Gait and posture are normal.  Examination head ears eyes nose and throat is unremarkable her chest and abdomen are benign.  Extremities are free of major deformity. 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  Plan C5-6 anterior cervical discectomy and fusion.  Risks and benefits been explained.  Patient wishes to proceed.  [facility] Hilary Pundt 11/22/2023, 1:46 PM

## 2023-11-22 NOTE — Progress Notes (Signed)
 Orthopedic Tech Progress Note Patient Details:  [name] [name] 03/19/1966 [id number]  Ortho Devices Type of Ortho Device: Soft collar Ortho Device/Splint Location: Neck Ortho Device/Splint Interventions: Ordered      Longino Trefz A Elisandra Deshmukh 11/22/2023, 7:03 PM

## 2023-11-22 NOTE — Brief Op Note (Signed)
 11/22/2023  4:33 PM  PATIENT:  [name] [name]  58 y.o. female  PRE-OPERATIVE DIAGNOSIS:  Cervical radiculopathy  POST-OPERATIVE DIAGNOSIS:  Cervical radiculopathy  PROCEDURE:  Procedure(s) with comments: ANTERIOR CERVICAL DECOMPRESSION/DISCECTOMY FUSION 1 LEVEL (N/A) - ACDF - C5-C6  SURGEON:  Surgeons and Role:    DEWAINE Louis Shove, MD - Primary  PHYSICIAN ASSISTANT:   ASSISTANTS: Bergman,NP   ANESTHESIA:   general  EBL:  5 mL   BLOOD ADMINISTERED:none  DRAINS: none   LOCAL MEDICATIONS USED:  NONE  SPECIMEN:  No Specimen  DISPOSITION OF SPECIMEN:  N/A  COUNTS:  YES  TOURNIQUET:  * No tourniquets in log *  DICTATION: .Dragon Dictation  PLAN OF CARE: Admit for overnight observation  PATIENT DISPOSITION:  PACU - hemodynamically stable.   Delay start of Pharmacological VTE agent (>24hrs) due to surgical blood loss or risk of bleeding: yes

## 2023-11-22 NOTE — Transfer of Care (Signed)
 Immediate Anesthesia Transfer of Care Note  Patient: [name] [name]  Procedure(s) Performed: ANTERIOR CERVICAL DECOMPRESSION/DISCECTOMY FUSION 1 LEVEL (Neck)  Patient Location: PACU  Anesthesia Type:General  Level of Consciousness: awake, alert , oriented, and drowsy  Airway & Oxygen Therapy: Patient Spontanous Breathing and Patient connected to face mask oxygen  Post-op Assessment: Report given to RN, Post -op Vital signs reviewed and stable, and Patient moving all extremities X 4  Post vital signs: Reviewed and stable  Last Vitals:  Vitals Value Taken Time  BP 153/92 11/22/23 17:15  Temp 36.7 C 11/22/23 17:10  Pulse 90 11/22/23 17:17  Resp 19 11/22/23 17:17  SpO2 96 % 11/22/23 17:17  Vitals shown include unfiled device data.  Last Pain:  Vitals:   11/22/23 1710  TempSrc:   PainSc: Asleep      Patients Stated Pain Goal: 5 (11/22/23 1235)  Complications: No notable events documented.

## 2023-11-22 NOTE — Progress Notes (Incomplete)
 Hypoglycemic Event  CBG: 64  Treatment: D50 25 mL (12.5 gm)  Symptoms: Nervous/irritable  Follow-up CBG: Time:*** CBG Result:***  Possible Reasons for Event: Inadequate meal intake  Comments/MD notified:Michele, CRNA and Juliene Clinton, MD notified. Patient going for surgery at this time and Olivia, CRNA made aware to recheck CBG in fifteen minutes per protocol     [name] [name]

## 2023-11-22 NOTE — Op Note (Signed)
 Date of procedure: 11/22/2023  Date of dictation: Same  Service: Neurosurgery  Preoperative diagnosis: Left C5-6 spondylosis with stenosis and radiculopathy  Postoperative diagnosis: Same  Procedure Name: C5-6 anterior cervical discectomy with interbody fusion utilizing interbody cage, WITH autograft, and anterior plate instrumentation  Surgeon:Mahati Vajda A.Lynnzie Blackson, M.D.  Asst. Surgeon: Jennetta, NP  Anesthesia: General  Indication: 58 year old with neck and left upper extremity pain numbness and some weakness.  Workup demonstrates evidence of multilevel cervical degeneration worst at C5-6 where she has a spondylitic protrusion and significant left sided spinal canal stenosis with compression of the left C6 nerve root.  Patient has failed conservative management presents now for C5-6 anterior cervical discectomy and fusion in hopes improving her symptoms.  Operative note: After induction anesthesia, patient positioned supine with neck slightly extended and held in place with halter traction.  Patient's anterior cervical region prepped and draped sterilely.  Incision made overlying C5-6.  Dissection performed in the right.  Retractor placed.  X-ray taken.  Level confirmed.  Disc base at C5-6 incised.  Discectomy performed using various instruments down to level of the posterior annulus.  Microscope then brought to the field used throughout the remainder of the discectomy.  Remaining aspects of annulus and osteophytes removed using high-speed drill.  Posterior logical is elevated and resected in piecemeal fashion.  Underlying thecal sac was identified.  A wide central decompression then performed undercutting the bodies of C5 and C6.  Decompression then proceeded into each neural foramina.  Wide anterior foraminotomies performed on course the exiting C6 nerve roots bilaterally.  At this point a very thorough decompression been achieved.  There was no evidence of injury to the thecal sac or nerve roots.  A 8  mm Medtronic anatomic peek cage was packed with locally harvested autograft.  Cage was impacted into place and recessed slightly from the anterior cortical margin.  25 mm Atlantis anterior cervical plate was then placed over the C5 and C6 levels.  This then attached under fluoroscopic guidance using 13 mm variable angle screws to each of both levels.  All 4 screws given final tightening found to be solidly within the bone.  Locking screws engaged both levels.  Final images reveal good position of the cage and the hardware at the proper operative level with normal alignment of the spine.  Wound was then irrigated.  Hemostasis was assured.  Wounds then closed in layers with Vicryl sutures.  Steri-Strips and sterile dressing were applied.  No apparent complications.  Patient tolerated the procedure well and she returns to the recovery room postop.

## 2023-11-22 NOTE — Anesthesia Procedure Notes (Signed)
 Procedure Name: Intubation Date/Time: 11/22/2023 3:05 PM  Performed by: Mollie Olivia SAUNDERS, CRNAPre-anesthesia Checklist: Patient identified, Emergency Drugs available, Suction available and Patient being monitored Patient Re-evaluated:Patient Re-evaluated prior to induction Oxygen Delivery Method: Circle system utilized Preoxygenation: Pre-oxygenation with 100% oxygen Induction Type: IV induction Ventilation: Mask ventilation without difficulty Laryngoscope Size: Glidescope and 3 Grade View: Grade I Tube type: Oral Number of attempts: 1 Airway Equipment and Method: Rigid stylet and Video-laryngoscopy Placement Confirmation: ETT inserted through vocal cords under direct vision, positive ETCO2 and breath sounds checked- equal and bilateral Secured at: 21 cm Tube secured with: Tape Dental Injury: Teeth and Oropharynx as per pre-operative assessment  Difficulty Due To: Difficult Airway-  due to neck instability

## 2023-11-23 ENCOUNTER — Encounter (HOSPITAL_COMMUNITY): Payer: Self-pay | Admitting: Neurosurgery

## 2023-11-23 DIAGNOSIS — M50122 Cervical disc disorder at C5-C6 level with radiculopathy: Secondary | ICD-10-CM | POA: Diagnosis not present

## 2023-11-23 LAB — GLUCOSE, CAPILLARY: Glucose-Capillary: 184 mg/dL — ABNORMAL HIGH (ref 70–99)

## 2023-11-23 MED ORDER — HYDROCODONE-ACETAMINOPHEN 5-325 MG PO TABS: 1.0000 | ORAL_TABLET | ORAL | 0 refills | Status: AC | PRN

## 2023-11-23 MED FILL — Thrombin For Soln 5000 Unit: CUTANEOUS | Qty: 2 | Status: AC

## 2023-11-23 NOTE — Discharge Instructions (Addendum)
  Wound Care  Leave incision open  Leave steri-strips on neck.  They will fall off by themselves. Do not put any creams, lotions, or ointments on incision. You are fine to shower. Let water  run over incision and pat dry.  Activity Walk each and every day, increasing distance each day. No lifting greater than 8 lbs.  Avoid excessive neck motion. No driving,  No lifting and you can ride as a passenger locally  Diet Resume your normal diet.   Return to Work Will be discussed at your follow up appointment.  Call Your Doctor If Any of These Occur Redness, drainage, or swelling at the wound.  Temperature greater than 101 degrees. Severe pain not relieved by pain medication. Incision starts to come apart.  Follow Up Appt Call [phone] if you have one or any problem.

## 2023-11-23 NOTE — Discharge Summary (Signed)
 Physician Discharge Summary  Patient ID: [name] [name] MRN: [id number] DOB/AGE: April 08, 1965 58 y.o.  Admit date: 11/22/2023 Discharge date: 11/23/2023  Admission Diagnoses:  Discharge Diagnoses:  Principal Problem:   Cervical radiculopathy   Discharged Condition: good  Hospital Course: Patient admitted to the hospital where she underwent uncomplicated C5-6 anterior cervical discectomy and fusion.  Postoperatively doing well.  Preoperative neck and left upper extremity symptoms resolved.  Standing ambulating and voiding without difficulty.  Swallowing well.  Voice strong.  Ready for discharge home.  Consults:   Significant Diagnostic Studies:   Treatments:   Discharge Exam: Blood pressure (!) 105/58, pulse (!) 57, temperature 98.5 F (36.9 C), temperature source Oral, resp. rate 17, height 5' 4 (1.626 m), weight 124.3 kg, SpO2 98%. Awake and alert.  Oriented and appropriate.  Motor and sensory function intact.  Wound clean and dry.  Chest and abdomen benign.  Disposition: Discharge disposition: 01-Home or Self Care        Allergies as of 11/23/2023       Reactions   Iodine Anaphylaxis   Seafood allergy   Shellfish Allergy Swelling   Hydromorphone     Other Reaction(s): Not available   Lisinopril    Other Reaction(s): Cough, Not available   Wound Dressing Adhesive Dermatitis        Medication List     TAKE these medications    albuterol  108 (90 Base) MCG/ACT inhaler Commonly known as: VENTOLIN  HFA Inhale 1-2 puffs into the lungs every 6 (six) hours as needed for wheezing or shortness of breath.   albuterol  (2.5 MG/3ML) 0.083% nebulizer solution Commonly known as: PROVENTIL  Take 3 mLs (2.5 mg total) by nebulization every 6 (six) hours as needed for wheezing or shortness of breath.   atorvastatin  20 MG tablet Commonly known as: LIPITOR Take 20 mg by mouth daily.   B-D ULTRAFINE III SHORT PEN 31G X 8 MM Misc Generic drug: Insulin  Pen  Needle See admin instructions.   cyclobenzaprine  10 MG tablet Commonly known as: FLEXERIL  Take 1 tablet (10 mg total) by mouth 2 (two) times daily as needed for muscle spasms.   EpiPen  2-Pak 0.3 mg/0.3 mL Soaj injection Generic drug: EPINEPHrine  as needed.   folic acid  1 MG tablet Commonly known as: FOLVITE  Take 1 mg by mouth daily.   HYDROcodone -acetaminophen  5-325 MG tablet Commonly known as: NORCO/VICODIN Take 1 tablet by mouth every 4 (four) hours as needed for moderate pain (pain score 4-6) ((score 4 to 6)).   ibuprofen  600 MG tablet Commonly known as: ADVIL  Take 1 tablet (600 mg total) by mouth every 6 (six) hours as needed.   ipratropium 0.02 % nebulizer solution Commonly known as: ATROVENT  Take 2.5 mLs (0.5 mg total) by nebulization 4 (four) times daily. What changed:  when to take this reasons to take this   Iron 28 MG Tabs Take 28 mg by mouth daily.   Jardiance  25 MG Tabs tablet Generic drug: empagliflozin  Take 25 mg by mouth daily.   losartan  25 MG tablet Commonly known as: COZAAR  Take 25 mg by mouth daily.   magnesium  gluconate 500 (27 Mg) MG Tabs tablet Commonly known as: MAGONATE Take 500 mg by mouth daily.   meclizine 25 MG tablet Commonly known as: ANTIVERT Take 25 mg by mouth 2 (two) times daily as needed for dizziness.   meloxicam  15 MG tablet Commonly known as: Mobic  Take 1 tablet (15 mg total) by mouth daily.   methocarbamol  500 MG tablet Commonly known as: ROBAXIN   Take 1 tablet (500 mg total) by mouth every 8 (eight) hours as needed for muscle spasms.   montelukast  10 MG tablet Commonly known as: SINGULAIR  Take 10 mg by mouth at bedtime.   multivitamin with minerals tablet Take 1 tablet by mouth daily.   Ozempic (0.25 or 0.5 MG/DOSE) 2 MG/3ML Sopn Generic drug: Semaglutide(0.25 or 0.5MG /DOS) Inject 0.5 mg into the skin once a week. Takes on saturday   Tresiba  FlexTouch 100 UNIT/ML FlexTouch Pen Generic drug: insulin   degludec Inject 29-30 Units into the skin daily at 10 pm.   Vitamin D3 50 MCG (2000 UT) capsule Take 2,000 Units by mouth daily.        Follow-up Information     Louis Shove, MD. Call.   Specialty: Neurosurgery Why: As needed, If symptoms worsen Contact information: [address] [place] [address] [phone]                 Signed: Shove [name] 11/23/2023, 11:18 AM

## 2023-11-23 NOTE — Progress Notes (Signed)
 Patient alert and oriented, void, ambulate. Surgical site clean and dry no sign of infection. D/c instructions explain and given to the patient.

## 2023-11-23 NOTE — Evaluation (Signed)
 Occupational Therapy Evaluation Patient Details Name: [name] [name] MRN: [id number] DOB: 08/08/1965 Today's Date: 11/23/2023   History of Present Illness   58 yo F s/p ACDF.  PMH includes: Anxiety, GERD and arthritis.     Clinical Impressions Patient admitted for the diagnosis above.  PTA she lives at home, alone, and needed no assist.  Currently presets with nausea limiting a full assessment.  Given PLOF and age, do not anticipate post acute rehab, or any DME needs.  Patient will be staying with her mother for a few days, who can provide supportive assist.  No further OT needs in the acute setting.  Recommend follow up as prescribed by MD.      If plan is discharge home, recommend the following:   Assistance with cooking/housework;Assist for transportation     Functional Status Assessment   Patient has had a recent decline in their functional status and demonstrates the ability to make significant improvements in function in a reasonable and predictable amount of time.     Equipment Recommendations   None recommended by OT     Recommendations for Other Services         Precautions/Restrictions   Precautions Precautions: Cervical Precaution Booklet Issued: Yes (comment) Recall of Precautions/Restrictions: Intact Required Braces or Orthoses: Cervical Brace Cervical Brace: Soft collar;For comfort Restrictions Weight Bearing Restrictions Per Provider Order: No     Mobility Bed Mobility Overal bed mobility: Needs Assistance Bed Mobility: Sidelying to Sit, Sit to Sidelying   Sidelying to sit: Supervision, HOB elevated     Sit to sidelying: Supervision, HOB elevated      Transfers                   General transfer comment: Not assessed due to nausea      Balance Overall balance assessment: Mild deficits observed, not formally tested                                         ADL either performed or assessed with  clinical judgement   ADL Overall ADL's : At baseline                                       General ADL Comments: Patient requesting to sleep a little more.  Anticipate generalized supervision.     Vision Baseline Vision/History: 1 Wears glasses Patient Visual Report: No change from baseline       Perception Perception: Not tested       Praxis Praxis: Not tested       Pertinent Vitals/Pain Pain Assessment Pain Assessment: No/denies pain     Extremity/Trunk Assessment Upper Extremity Assessment Upper Extremity Assessment: Overall WFL for tasks assessed   Lower Extremity Assessment Lower Extremity Assessment: Overall WFL for tasks assessed   Cervical / Trunk Assessment Cervical / Trunk Assessment: Neck Surgery   Communication Communication Communication: No apparent difficulties   Cognition Arousal: Lethargic, Suspect due to medications Behavior During Therapy: WFL for tasks assessed/performed Cognition: No apparent impairments                               Following commands: Intact       Cueing  General Comments   Cueing Techniques: Verbal cues  Nausea   Exercises     Shoulder Instructions      Home Living Family/patient expects to be discharged to:: Private residence Living Arrangements: Parent Available Help at Discharge: Family;Available 24 hours/day Type of Home: House Home Access: Stairs to enter [employer] of Steps: 2   Home Layout: One level     Bathroom Shower/Tub: Chief Strategy Officer: Standard Bathroom Accessibility: Yes How Accessible: Accessible via walker Home Equipment: None          Prior Functioning/Environment Prior Level of Function : Independent/Modified Independent                    OT Problem List: Impaired balance (sitting and/or standing)   OT Treatment/Interventions:        OT Goals(Current goals can be found in the care plan section)    Acute Rehab OT Goals Patient Stated Goal: Return home OT Goal Formulation: With patient Time For Goal Achievement: 12/07/23 Potential to Achieve Goals: Good   OT Frequency:       Co-evaluation              AM-PAC OT 6 Clicks Daily Activity     Outcome Measure Help from another person eating meals?: None Help from another person taking care of personal grooming?: None Help from another person toileting, which includes using toliet, bedpan, or urinal?: A Little Help from another person bathing (including washing, rinsing, drying)?: A Little Help from another person to put on and taking off regular upper body clothing?: None Help from another person to put on and taking off regular lower body clothing?: A Little 6 Click Score: 21   End of Session    Activity Tolerance: Other (comment) (limited by nausea) Patient left:    OT Visit Diagnosis: Unsteadiness on feet (R26.81)                Time: 9188-9166 OT Time Calculation (min): 22 min Charges:  OT General Charges $OT Visit: 1 Visit OT Evaluation $OT Eval Moderate Complexity: 1 Mod  11/23/2023  RP, OTR/L  Acute Rehabilitation Services  Office:  [phone]   Charlie JONETTA Halsted 11/23/2023, 8:38 AM

## 2023-11-23 NOTE — Anesthesia Postprocedure Evaluation (Signed)
 Anesthesia Post Note  Patient: [name] [name]  Procedure(s) Performed: ANTERIOR CERVICAL DECOMPRESSION/DISCECTOMY FUSION 1 LEVEL (Neck)     Patient location during evaluation: PACU Anesthesia Type: General Level of consciousness: awake and alert Pain management: pain level controlled Vital Signs Assessment: post-procedure vital signs reviewed and stable Respiratory status: spontaneous breathing, nonlabored ventilation, respiratory function stable and patient connected to nasal cannula oxygen Cardiovascular status: blood pressure returned to baseline and stable Postop Assessment: no apparent nausea or vomiting Anesthetic complications: no   No notable events documented.  Last Vitals:  Vitals:   11/23/23 0258 11/23/23 0918  BP: (!) 105/57 (!) 105/58  Pulse: 77 (!) 57  Resp: 20 17  Temp: 36.9 C   SpO2: 95% 98%    Last Pain:  Vitals:   11/23/23 0558  TempSrc:   PainSc: 7                  Ameera Tigue S

## 2023-12-01 ENCOUNTER — Other Ambulatory Visit: Payer: Self-pay | Admitting: Family Medicine

## 2023-12-01 DIAGNOSIS — Z1231 Encounter for screening mammogram for malignant neoplasm of breast: Secondary | ICD-10-CM

## 2023-12-30 ENCOUNTER — Ambulatory Visit
Admission: RE | Admit: 2023-12-30 | Discharge: 2023-12-30 | Disposition: A | Source: Ambulatory Visit | Attending: Family Medicine | Admitting: Family Medicine

## 2023-12-30 DIAGNOSIS — Z1231 Encounter for screening mammogram for malignant neoplasm of breast: Secondary | ICD-10-CM
